# Patient Record
Sex: Male | Born: 1965 | Race: White | Hispanic: No | Marital: Married | Smoking: Former smoker
Health system: Southern US, Community
[De-identification: ages and names within clinical notes are randomized; demographics above are authoritative.]

## PROBLEM LIST (undated history)

## (undated) DIAGNOSIS — I10 Essential (primary) hypertension: Secondary | ICD-10-CM

## (undated) DIAGNOSIS — F988 Other specified behavioral and emotional disorders with onset usually occurring in childhood and adolescence: Secondary | ICD-10-CM

## (undated) DIAGNOSIS — M1990 Unspecified osteoarthritis, unspecified site: Secondary | ICD-10-CM

## (undated) DIAGNOSIS — F32A Depression, unspecified: Secondary | ICD-10-CM

## (undated) DIAGNOSIS — F329 Major depressive disorder, single episode, unspecified: Secondary | ICD-10-CM

---

## 1994-10-25 HISTORY — PX: OTHER SURGICAL HISTORY: SHX169

## 1998-10-25 HISTORY — PX: OTHER SURGICAL HISTORY: SHX169

## 2008-10-25 HISTORY — PX: OTHER SURGICAL HISTORY: SHX169

## 2009-01-06 ENCOUNTER — Emergency Department (HOSPITAL_COMMUNITY): Admission: EM | Admit: 2009-01-06 | Discharge: 2009-01-06 | Payer: Self-pay | Admitting: Emergency Medicine

## 2009-10-25 HISTORY — PX: OTHER SURGICAL HISTORY: SHX169

## 2010-07-24 ENCOUNTER — Emergency Department (HOSPITAL_COMMUNITY): Admission: EM | Admit: 2010-07-24 | Discharge: 2010-07-24 | Payer: Self-pay | Admitting: Emergency Medicine

## 2010-07-27 ENCOUNTER — Emergency Department (HOSPITAL_COMMUNITY): Admission: EM | Admit: 2010-07-27 | Discharge: 2010-07-27 | Payer: Self-pay | Admitting: Emergency Medicine

## 2010-07-28 ENCOUNTER — Emergency Department (HOSPITAL_COMMUNITY): Admission: EM | Admit: 2010-07-28 | Discharge: 2010-07-28 | Payer: Self-pay | Admitting: Emergency Medicine

## 2010-07-30 ENCOUNTER — Emergency Department (HOSPITAL_COMMUNITY): Admission: EM | Admit: 2010-07-30 | Discharge: 2010-07-30 | Payer: Self-pay | Admitting: Family Medicine

## 2010-09-17 ENCOUNTER — Emergency Department (HOSPITAL_COMMUNITY): Admission: EM | Admit: 2010-09-17 | Discharge: 2010-09-18 | Payer: Self-pay | Admitting: Emergency Medicine

## 2010-11-14 ENCOUNTER — Encounter: Payer: Self-pay | Admitting: Family Medicine

## 2010-11-15 ENCOUNTER — Encounter: Payer: Self-pay | Admitting: Orthopedic Surgery

## 2010-12-31 ENCOUNTER — Other Ambulatory Visit: Payer: Self-pay | Admitting: Family Medicine

## 2010-12-31 DIAGNOSIS — M545 Low back pain, unspecified: Secondary | ICD-10-CM

## 2010-12-31 DIAGNOSIS — W19XXXA Unspecified fall, initial encounter: Secondary | ICD-10-CM

## 2010-12-31 DIAGNOSIS — R202 Paresthesia of skin: Secondary | ICD-10-CM

## 2011-01-01 ENCOUNTER — Other Ambulatory Visit: Payer: Self-pay

## 2011-01-03 ENCOUNTER — Emergency Department (HOSPITAL_COMMUNITY)
Admission: EM | Admit: 2011-01-03 | Discharge: 2011-01-03 | Disposition: A | Discharge status: Home / Self Care | Payer: Medicaid Other | Attending: Emergency Medicine | Admitting: Emergency Medicine

## 2011-01-03 DIAGNOSIS — M549 Dorsalgia, unspecified: Secondary | ICD-10-CM | POA: Insufficient documentation

## 2011-01-03 DIAGNOSIS — M5430 Sciatica, unspecified side: Secondary | ICD-10-CM | POA: Insufficient documentation

## 2011-01-05 LAB — RAPID URINE DRUG SCREEN, HOSP PERFORMED
Benzodiazepines: POSITIVE — AB
Cocaine: NOT DETECTED
Tetrahydrocannabinol: NOT DETECTED

## 2011-01-05 LAB — CBC
Hemoglobin: 9.1 g/dL — ABNORMAL LOW (ref 13.0–17.0)
MCH: 31.2 pg (ref 26.0–34.0)
MCHC: 33.2 g/dL (ref 30.0–36.0)
Platelets: 385 10*3/uL (ref 150–400)
RDW: 17.7 % — ABNORMAL HIGH (ref 11.5–15.5)

## 2011-01-05 LAB — COMPREHENSIVE METABOLIC PANEL
ALT: 17 U/L (ref 0–53)
AST: 29 U/L (ref 0–37)
Albumin: 3.3 g/dL — ABNORMAL LOW (ref 3.5–5.2)
Alkaline Phosphatase: 63 U/L (ref 39–117)
BUN: 12 mg/dL (ref 6–23)
Chloride: 101 mEq/L (ref 96–112)
GFR calc Af Amer: >60 mL/min (ref >60)
Potassium: 3.2 mEq/L — ABNORMAL LOW (ref 3.5–5.1)
Sodium: 138 mEq/L (ref 135–145)
Total Bilirubin: 0.5 mg/dL (ref 0.3–1.2)

## 2011-01-05 LAB — DIFFERENTIAL
Basophils Absolute: 0 10*3/uL (ref 0.0–0.1)
Basophils Relative: 0 % (ref 0–1)
Eosinophils Absolute: 0.3 10*3/uL (ref 0.0–0.7)
Eosinophils Relative: 2 % (ref 0–5)
Monocytes Absolute: 1.1 10*3/uL — ABNORMAL HIGH (ref 0.1–1.0)

## 2011-01-05 LAB — ACETAMINOPHEN LEVEL: Acetaminophen (Tylenol), Serum: 13.2 ug/mL (ref 10–30)

## 2011-01-05 LAB — ETHANOL: Alcohol, Ethyl (B): <5 mg/dL (ref 0–10)

## 2011-02-01 ENCOUNTER — Other Ambulatory Visit: Payer: Self-pay | Admitting: Orthopedic Surgery

## 2011-02-01 ENCOUNTER — Encounter (HOSPITAL_COMMUNITY): Payer: Medicaid Other

## 2011-02-01 ENCOUNTER — Ambulatory Visit (HOSPITAL_COMMUNITY)
Admission: RE | Admit: 2011-02-01 | Discharge: 2011-02-01 | Disposition: A | Discharge status: Home / Self Care | Payer: Medicaid Other | Source: Ambulatory Visit | Attending: Orthopedic Surgery | Admitting: Orthopedic Surgery

## 2011-02-01 ENCOUNTER — Other Ambulatory Visit (HOSPITAL_COMMUNITY): Payer: Self-pay | Admitting: Orthopedic Surgery

## 2011-02-01 DIAGNOSIS — Z01818 Encounter for other preprocedural examination: Secondary | ICD-10-CM

## 2011-02-01 DIAGNOSIS — Z01812 Encounter for preprocedural laboratory examination: Secondary | ICD-10-CM | POA: Insufficient documentation

## 2011-02-01 LAB — DIFFERENTIAL
Basophils Relative: 0 % (ref 0–1)
Eosinophils Absolute: 0 10*3/uL (ref 0.0–0.7)
Lymphs Abs: 1.9 10*3/uL (ref 0.7–4.0)
Monocytes Absolute: 1 10*3/uL (ref 0.1–1.0)
Monocytes Relative: 8 % (ref 3–12)

## 2011-02-01 LAB — COMPREHENSIVE METABOLIC PANEL
ALT: 16 U/L (ref 0–53)
AST: 18 U/L (ref 0–37)
Alkaline Phosphatase: 65 U/L (ref 39–117)
CO2: 25 mEq/L (ref 19–32)
Calcium: 9.3 mg/dL (ref 8.4–10.5)
Chloride: 106 mEq/L (ref 96–112)
GFR calc non Af Amer: >60 mL/min (ref >60)
Glucose, Bld: 105 mg/dL — ABNORMAL HIGH (ref 70–99)
Sodium: 140 mEq/L (ref 135–145)
Total Bilirubin: 0.4 mg/dL (ref 0.3–1.2)

## 2011-02-01 LAB — CBC
Hemoglobin: 13 g/dL (ref 13.0–17.0)
MCH: 33.2 pg (ref 26.0–34.0)
MCHC: 33.6 g/dL (ref 30.0–36.0)
MCV: 98.7 fL (ref 78.0–100.0)

## 2011-02-01 LAB — URINALYSIS, ROUTINE W REFLEX MICROSCOPIC
Bilirubin Urine: NEGATIVE
Glucose, UA: NEGATIVE mg/dL
Hgb urine dipstick: NEGATIVE
Protein, ur: NEGATIVE mg/dL
Urobilinogen, UA: 0.2 mg/dL (ref 0.0–1.0)

## 2011-02-01 LAB — PROTIME-INR: Prothrombin Time: 13.7 seconds (ref 11.6–15.2)

## 2011-02-01 LAB — TYPE AND SCREEN: Antibody Screen: NEGATIVE

## 2011-02-01 LAB — SURGICAL PCR SCREEN: Staphylococcus aureus: NEGATIVE

## 2011-02-05 ENCOUNTER — Ambulatory Visit (HOSPITAL_COMMUNITY): Payer: Medicaid Other

## 2011-02-05 ENCOUNTER — Other Ambulatory Visit: Payer: Self-pay | Admitting: Orthopedic Surgery

## 2011-02-05 ENCOUNTER — Observation Stay (HOSPITAL_COMMUNITY)
Admission: RE | Admit: 2011-02-05 | Discharge: 2011-02-06 | Disposition: A | Discharge status: Home / Self Care | Payer: Medicaid Other | Source: Ambulatory Visit | Attending: Orthopedic Surgery | Admitting: Orthopedic Surgery

## 2011-02-05 DIAGNOSIS — M5126 Other intervertebral disc displacement, lumbar region: Principal | ICD-10-CM | POA: Insufficient documentation

## 2011-02-05 DIAGNOSIS — F411 Generalized anxiety disorder: Secondary | ICD-10-CM | POA: Insufficient documentation

## 2011-02-05 DIAGNOSIS — M549 Dorsalgia, unspecified: Secondary | ICD-10-CM

## 2011-02-05 DIAGNOSIS — M79609 Pain in unspecified limb: Secondary | ICD-10-CM | POA: Insufficient documentation

## 2011-02-11 NOTE — Op Note (Signed)
Christopher Underwood, Christopher Underwood                  ACCOUNT NO.:  [id number]  MEDICAL RECORD NO.:  [id number]           PATIENT TYPE:  O  LOCATION:  DAYL                         FACILITY:  [facility]  PHYSICIAN:  Georges Lynch. Jawon Dipiero, M.D.DATE OF BIRTH:  04-20-1966  DATE OF PROCEDURE:  02/05/2011 DATE OF DISCHARGE:                              OPERATIVE REPORT   SURGEON:  Georges Lynch. Darrelyn Hillock, MD and Marlowe Kays, MD  PREOPERATIVE DIAGNOSES: 1. Lateral recess stenosis, L5-S1, on the left with rather significant     foraminal stenosis, L5-S1, on the left. 2. Large herniated disk, L5-S1, on the left.  POSTOPERATIVE DIAGNOSES: 1. Lateral recess stenosis, L5-S1, on the left with rather significant     foraminal stenosis, L5-S1, on the left. 2. Large herniated disk, L5-S1, on the left.  OPERATION: 1. Decompression of lateral recess, L5-S1, on the left. 2. Foraminotomy, L5-S1 on the left. 3. Microdiskectomy for large herniated disk, L5-S1, on the left.  Note, preoperatively the only thing he have is left leg pain.  PROCEDURE:  Under general anesthesia, the patient on the spinal frame, routine orthopedic prep and drape of the back carried out and the appropriate time-out was carried out first.  I also marked the appropriate left side of his back in the holding area.  At this time, two needles were placed in the back for localization purposes.  X-ray was taken.  At this time, another incision was made over L5-S1. Bleeders identified and cauterized.  I stripped the muscle from the lamina and spinous processes bilaterally.  Another x-ray was taken to verify the position.  I then went down, stripped the muscle from the L5- S1 space, identified the space, brought the microscope in, and then did a hemilaminectomy in usual fashion, L5-S1 on the left.  We had marked overgrowth of the lateral recess and narrowing of the foramina. Following that, we gently stripped the ligamentum flavum.  Great care was taken not  to injure the underlying dura.  At this time, I then went down and gently identified the dura and we had to decompress the recess and opened up the foramen in order to expose the root which was extremely sensitive to touch.  We cauterized lateral recess veins, gently retracted the dura with D'Errico retractor, identified the disk. A needle was placed in the disk space and x-ray was taken.  Note, the appropriate L5-S1 disk space was just distal to the compression fracture of L5.  The cruciate incision then was made in the posterior longitudinal ligament of L5-S1 and multiple large pieces of disk material was removed.  I then utilized a nerve hook and the Epstein curettes and the curette out the subligamentous material as well.  Once this was completed, we then made sure that the nerve root was free.  We extended the foraminotomy.  We were able to easily pass a hockey-stick out proximally and distally without any problem.  We thoroughly irrigated out the area, loosely applied some thrombin-soaked Gelfoam, and the wound was closed in layers in usual fashion except I left a small deep distal part of the wound open  for drainage purposes.  Subcu was closed with 0 Vicryl, skin with nylon staples, and sterile Neosporin dressing was applied.  Preop, given 1 g of IV Ancef.  SURGEON:  Georges Lynch. Darrelyn Hillock, MD  ASSISTANT:  Marlowe Kays, MD          ______________________________ Georges Lynch Darrelyn Hillock, M.D.     RAG/MEDQ  D:  02/05/2011  T:  02/05/2011  Job:  [id number]  Electronically Signed by Ranee Gosselin M.D. on 02/11/2011 04:54:09 AM

## 2011-02-17 NOTE — Discharge Summary (Addendum)
  NAMEERWIN, NISHIYAMA                  ACCOUNT NO.:  [id number]  MEDICAL RECORD NO.:  [id number]           PATIENT TYPE:  O  LOCATION:  1611                         FACILITY:  [facility]  PHYSICIAN:  Georges Lynch. Haig Gerardo, M.D.DATE OF BIRTH:  [birth year]  DATE OF ADMISSION:  02/05/2011 DATE OF DISCHARGE:  02/06/2011                              DISCHARGE SUMMARY   ADDENDUM:  Please add this to just the rest of the labs.  Pathology repot is sample of the disc material was sent to the lab and was found to be consistent with fibrocartilage intravertebral disc material.  HOSPITAL COURSE:  The patient was admitted to [facility] on February 05, 2011.  He underwent the above-stated procedure without complication.  Following the adequate time to the recovery room, he was taken to the floor for further recovery.  He was on PCA with reduced dose of Plavix as well as muscle relaxant for pain control. Postoperative day #1, the patient seems to be doing well.  He was thorough from his surgery but otherwise pain is well controlled.  No complaints of nausea or vomiting.  Plan to discharge home on postoperative day #1, which is February 06, 2011.  He was also visited by physical therapy for an evaluation and was educated on back precaution.  DISPOSITION:  To home on February 06, 2011.  DISCHARGE MEDICATIONS: 1. Robaxin 500 mg 1 tab p.o. q.6h. p.r.n. muscle spasm. 2. Oxycodone/acetaminophen 10/650 mg 1 tab p.o. q.4h. p.r.n. pain. 3. Lexapro 20 mg 1 tab p.o. q.a.m. 4. Vitamin C over-the-counter 1 tab p.o. daily. 5. Vitamin E over-the-counter 1 tab p.o. daily. 6. Xanax 1 mg 1 tab p.o. b.i.d. p.r.n. anxiety.  DISCHARGE INSTRUCTIONS: 1. Activities:  The patient will increase his activities slowly.  He     should use a walker.  He is able to shower. 2. Diet:  No restrictions. 3. Wound Care:  Daily dressing changes.  To keep the dressing clean     and dry. 4. Followup:  He will follow up with Dr. Darrelyn Hillock  in 2 weeks from the     day of surgery.  He should contact the office at [phone] at     scheduled appointment.  CONDITION ON DISCHARGE:  Improved.     Rozell Searing, PAC   ______________________________ Georges Lynch Darrelyn Hillock, M.D.    LD/MEDQ  D:  02/15/2011  T:  02/15/2011  Job:  [id number]  Electronically Signed by Rozell Searing  on 02/17/2011 07:03:46 PM Electronically Signed by Ranee Gosselin M.D. on 03/05/2011 06:47:07 AM

## 2011-02-17 NOTE — Discharge Summary (Addendum)
  Underwood, Christopher                  ACCOUNT NO.:  [id number]  MEDICAL RECORD NO.:  [id number]           PATIENT TYPE:  O  LOCATION:  1611                         FACILITY:  [facility]  PHYSICIAN:  Georges Lynch. Christie Copley, M.D.DATE OF BIRTH:  1966-09-23  DATE OF ADMISSION:  02/05/2011 DATE OF DISCHARGE:  02/06/2011                              DISCHARGE SUMMARY   ADMITTING DIAGNOSES: 1. Large disc herniation. 2. Anxiety.  DISCHARGE DIAGNOSES: 1. Large disc herniation, status post microdiscectomy. 2. Anxiety.  PROCEDURE:  On 02/05/2011, Christopher Underwood was taken to the operating room by surgeon, Dr. Ranee Gosselin, assistant Dr. Marlowe Kays.  He underwent decompression of lateral recess at L5-S1 on the left, foraminotomy at L5-S1 on the left and microdiscectomy for large herniated disc at L5-S1 on the left.  The procedure was performed under general anesthesia.  Routine orthopedic prep and drape were carried out. There were no complications with the procedure.  The patient was returned to the recovery room in satisfactory condition.  Also, he was given 1 g IV Ancef preoperatively.  LABORATORY DATA:  Fragments of the soft tissue from the discectomy were sent to the lab for pathology and it was found to be  DICTATION ENDED AT THIS POINT.     Rozell Searing, PAC   ______________________________ Georges Lynch Darrelyn Hillock, M.D.    LD/MEDQ  D:  02/15/2011  T:  02/15/2011  Job:  [id number]  Electronically Signed by Rozell Searing  on 02/17/2011 07:03:43 PM Electronically Signed by Ranee Gosselin M.D. on 03/05/2011 06:47:05 AM

## 2011-07-25 ENCOUNTER — Emergency Department (HOSPITAL_COMMUNITY)
Admission: EM | Admit: 2011-07-25 | Discharge: 2011-07-25 | Disposition: A | Discharge status: Home / Self Care | Payer: Medicaid Other | Attending: Emergency Medicine | Admitting: Emergency Medicine

## 2011-07-25 DIAGNOSIS — M948X9 Other specified disorders of cartilage, unspecified sites: Secondary | ICD-10-CM | POA: Insufficient documentation

## 2011-07-25 DIAGNOSIS — M25579 Pain in unspecified ankle and joints of unspecified foot: Secondary | ICD-10-CM | POA: Insufficient documentation

## 2011-07-25 DIAGNOSIS — M79609 Pain in unspecified limb: Secondary | ICD-10-CM | POA: Insufficient documentation

## 2011-07-25 DIAGNOSIS — G8929 Other chronic pain: Secondary | ICD-10-CM | POA: Insufficient documentation

## 2011-07-25 DIAGNOSIS — Z79899 Other long term (current) drug therapy: Secondary | ICD-10-CM | POA: Insufficient documentation

## 2012-05-30 ENCOUNTER — Encounter (HOSPITAL_COMMUNITY): Payer: Self-pay

## 2012-05-30 ENCOUNTER — Encounter (HOSPITAL_COMMUNITY): Payer: Self-pay | Admitting: Pharmacy Technician

## 2012-05-30 ENCOUNTER — Other Ambulatory Visit: Payer: Self-pay

## 2012-05-30 ENCOUNTER — Encounter (HOSPITAL_COMMUNITY)
Admission: RE | Admit: 2012-05-30 | Discharge: 2012-05-30 | Disposition: A | Discharge status: Home / Self Care | Payer: Medicaid Other | Source: Ambulatory Visit | Attending: Orthopedic Surgery | Admitting: Orthopedic Surgery

## 2012-05-30 DIAGNOSIS — Z01810 Encounter for preprocedural cardiovascular examination: Secondary | ICD-10-CM | POA: Insufficient documentation

## 2012-05-30 DIAGNOSIS — Z01812 Encounter for preprocedural laboratory examination: Secondary | ICD-10-CM | POA: Insufficient documentation

## 2012-05-30 DIAGNOSIS — R000 Tachycardia, unspecified: Secondary | ICD-10-CM | POA: Insufficient documentation

## 2012-05-30 HISTORY — DX: Other specified behavioral and emotional disorders with onset usually occurring in childhood and adolescence: F98.8

## 2012-05-30 HISTORY — DX: Major depressive disorder, single episode, unspecified: F32.9

## 2012-05-30 HISTORY — DX: Depression, unspecified: F32.A

## 2012-05-30 HISTORY — DX: Unspecified osteoarthritis, unspecified site: M19.90

## 2012-05-30 LAB — COMPREHENSIVE METABOLIC PANEL
ALT: 91 U/L — ABNORMAL HIGH (ref 0–53)
AST: 72 U/L — ABNORMAL HIGH (ref 0–37)
Albumin: 4.7 g/dL (ref 3.5–5.2)
Alkaline Phosphatase: 91 U/L (ref 39–117)
BUN: 9 mg/dL (ref 6–23)
CO2: 24 mEq/L (ref 19–32)
Calcium: 9.7 mg/dL (ref 8.4–10.5)
Chloride: 97 mEq/L (ref 96–112)
Creatinine, Ser: 0.99 mg/dL (ref 0.50–1.35)
GFR calc Af Amer: >90 mL/min (ref >90)
GFR calc non Af Amer: >90 mL/min (ref >90)
Glucose, Bld: 125 mg/dL — ABNORMAL HIGH (ref 70–99)
Potassium: 3.7 mEq/L (ref 3.5–5.1)
Sodium: 138 mEq/L (ref 135–145)
Total Bilirubin: 0.2 mg/dL — ABNORMAL LOW (ref 0.3–1.2)
Total Protein: 8.4 g/dL — ABNORMAL HIGH (ref 6.0–8.3)

## 2012-05-30 LAB — CBC
HCT: 41 % (ref 39.0–52.0)
MCV: 98.6 fL (ref 78.0–100.0)
Platelets: 304 10*3/uL (ref 150–400)
RBC: 4.16 MIL/uL — ABNORMAL LOW (ref 4.22–5.81)
WBC: 9.6 10*3/uL (ref 4.0–10.5)

## 2012-05-30 LAB — URINALYSIS, ROUTINE W REFLEX MICROSCOPIC
Bilirubin Urine: NEGATIVE
Glucose, UA: NEGATIVE mg/dL
Hgb urine dipstick: NEGATIVE
Leukocytes, UA: NEGATIVE
Nitrite: NEGATIVE
Protein, ur: NEGATIVE mg/dL
Specific Gravity, Urine: 1.037 — ABNORMAL HIGH (ref 1.005–1.030)
Urobilinogen, UA: 0.2 mg/dL (ref 0.0–1.0)
pH: 5.5 (ref 5.0–8.0)

## 2012-05-30 LAB — PROTIME-INR
INR: 0.95 (ref 0.00–1.49)
Prothrombin Time: 12.9 seconds (ref 11.6–15.2)

## 2012-05-30 LAB — SURGICAL PCR SCREEN: Staphylococcus aureus: NEGATIVE

## 2012-05-30 LAB — APTT: aPTT: 31 seconds (ref 24–37)

## 2012-05-30 NOTE — Pre-Procedure Instructions (Signed)
obtained ekg due to elevated blood pressure at pst visit. Pt heart rate elevated to 120.

## 2012-05-30 NOTE — Patient Instructions (Addendum)
20 Christopher Underwood  05/30/2012   Your procedure is scheduled on:    Report to [facility] at 1000  AM.  Call this number if you have problems the morning of surgery: [phone]   Remember:   Do not eat food or drink liquids:After Midnight.  .  Take these medicines the morning of surgery with A SIP OF WATER:pt wishes to take no medicines morning of surgery.   Do not wear jewelry or make up.  Do not wear lotions, powders, or perfumes.Do not wear deodorant.    Do not bring valuables to the hospital.  Contacts, dentures or bridgework may not be worn into surgery.  Leave suitcase in the car. After surgery it may be brought to your room.  For patients admitted to the hospital, checkout time is 11:00 AM the day   discharge                             Special Instructions: CHG Shower Use Special Wash: 1/2 bottle night before surgery and 1/2 bottle morning of surgery, use regular soap on face and front and back private area. You may shave your face morning of surgery.   Please read over the following fact sheets that you were given: MRSA Information  [facility] pre op nurse phone number [phone], call if needed

## 2012-06-01 NOTE — Progress Notes (Signed)
Faxed dr Darrelyn Hillock and made aware bp elevated at pst visit, ekg sinus tach.fax confirmnation received and placed on chart

## 2012-06-06 ENCOUNTER — Ambulatory Visit (HOSPITAL_COMMUNITY): Admission: RE | Admit: 2012-06-06 | Payer: Medicaid Other | Source: Ambulatory Visit | Admitting: Orthopedic Surgery

## 2012-06-06 ENCOUNTER — Encounter (HOSPITAL_COMMUNITY): Admission: RE | Payer: Self-pay | Source: Ambulatory Visit

## 2012-06-06 SURGERY — REPAIR, ROTATOR CUFF, OPEN
Anesthesia: General | Site: Shoulder | Laterality: Right

## 2015-07-23 ENCOUNTER — Emergency Department (HOSPITAL_COMMUNITY): Payer: Medicaid Other

## 2015-07-23 ENCOUNTER — Encounter (HOSPITAL_COMMUNITY): Payer: Self-pay | Admitting: Emergency Medicine

## 2015-07-23 ENCOUNTER — Emergency Department (HOSPITAL_COMMUNITY)
Admission: EM | Admit: 2015-07-23 | Discharge: 2015-07-23 | Disposition: A | Discharge status: Home / Self Care | Payer: Medicaid Other | Attending: Emergency Medicine | Admitting: Emergency Medicine

## 2015-07-23 DIAGNOSIS — R0602 Shortness of breath: Secondary | ICD-10-CM | POA: Insufficient documentation

## 2015-07-23 DIAGNOSIS — R55 Syncope and collapse: Secondary | ICD-10-CM | POA: Insufficient documentation

## 2015-07-23 DIAGNOSIS — Y9289 Other specified places as the place of occurrence of the external cause: Secondary | ICD-10-CM | POA: Insufficient documentation

## 2015-07-23 DIAGNOSIS — Y998 Other external cause status: Secondary | ICD-10-CM | POA: Diagnosis not present

## 2015-07-23 DIAGNOSIS — G8929 Other chronic pain: Secondary | ICD-10-CM

## 2015-07-23 DIAGNOSIS — R000 Tachycardia, unspecified: Secondary | ICD-10-CM | POA: Insufficient documentation

## 2015-07-23 DIAGNOSIS — R61 Generalized hyperhidrosis: Secondary | ICD-10-CM | POA: Insufficient documentation

## 2015-07-23 DIAGNOSIS — E860 Dehydration: Secondary | ICD-10-CM | POA: Diagnosis not present

## 2015-07-23 DIAGNOSIS — F909 Attention-deficit hyperactivity disorder, unspecified type: Secondary | ICD-10-CM | POA: Diagnosis not present

## 2015-07-23 DIAGNOSIS — S0101XA Laceration without foreign body of scalp, initial encounter: Secondary | ICD-10-CM | POA: Insufficient documentation

## 2015-07-23 DIAGNOSIS — Z79899 Other long term (current) drug therapy: Secondary | ICD-10-CM | POA: Insufficient documentation

## 2015-07-23 DIAGNOSIS — Y9389 Activity, other specified: Secondary | ICD-10-CM | POA: Insufficient documentation

## 2015-07-23 DIAGNOSIS — W01198A Fall on same level from slipping, tripping and stumbling with subsequent striking against other object, initial encounter: Secondary | ICD-10-CM | POA: Insufficient documentation

## 2015-07-23 DIAGNOSIS — S0191XA Laceration without foreign body of unspecified part of head, initial encounter: Secondary | ICD-10-CM

## 2015-07-23 DIAGNOSIS — M1990 Unspecified osteoarthritis, unspecified site: Secondary | ICD-10-CM | POA: Insufficient documentation

## 2015-07-23 DIAGNOSIS — S299XXA Unspecified injury of thorax, initial encounter: Secondary | ICD-10-CM | POA: Diagnosis not present

## 2015-07-23 DIAGNOSIS — S0990XA Unspecified injury of head, initial encounter: Secondary | ICD-10-CM | POA: Diagnosis present

## 2015-07-23 DIAGNOSIS — Z87891 Personal history of nicotine dependence: Secondary | ICD-10-CM | POA: Diagnosis not present

## 2015-07-23 LAB — CBC
HCT: 42.4 % (ref 39.0–52.0)
HEMOGLOBIN: 15.1 g/dL (ref 13.0–17.0)
MCH: 35 pg — AB (ref 26.0–34.0)
MCHC: 35.6 g/dL (ref 30.0–36.0)
MCV: 98.1 fL (ref 78.0–100.0)
PLATELETS: 133 10*3/uL — AB (ref 150–400)
RBC: 4.32 MIL/uL (ref 4.22–5.81)
RDW: 12 % (ref 11.5–15.5)
WBC: 11.4 10*3/uL — ABNORMAL HIGH (ref 4.0–10.5)

## 2015-07-23 LAB — I-STAT TROPONIN, ED
TROPONIN I, POC: 0 ng/mL (ref 0.00–0.08)
Troponin i, poc: 0 ng/mL (ref 0.00–0.08)

## 2015-07-23 LAB — COMPREHENSIVE METABOLIC PANEL
ALT: 53 U/L (ref 17–63)
ANION GAP: 18 — AB (ref 5–15)
AST: 55 U/L — AB (ref 15–41)
Albumin: 4.5 g/dL (ref 3.5–5.0)
Alkaline Phosphatase: 74 U/L (ref 38–126)
BILIRUBIN TOTAL: 0.7 mg/dL (ref 0.3–1.2)
BUN: 15 mg/dL (ref 6–20)
CHLORIDE: 93 mmol/L — AB (ref 101–111)
CO2: 23 mmol/L (ref 22–32)
Calcium: 9.8 mg/dL (ref 8.9–10.3)
Creatinine, Ser: 1.63 mg/dL — ABNORMAL HIGH (ref 0.61–1.24)
GFR calc Af Amer: 56 mL/min — ABNORMAL LOW (ref >60)
GFR, EST NON AFRICAN AMERICAN: 48 mL/min — AB (ref >60)
Glucose, Bld: 125 mg/dL — ABNORMAL HIGH (ref 65–99)
POTASSIUM: 2.8 mmol/L — AB (ref 3.5–5.1)
Sodium: 134 mmol/L — ABNORMAL LOW (ref 135–145)
TOTAL PROTEIN: 8.6 g/dL — AB (ref 6.5–8.1)

## 2015-07-23 LAB — LIPASE, BLOOD: LIPASE: 43 U/L (ref 22–51)

## 2015-07-23 MED ORDER — SODIUM CHLORIDE 0.9 % IV BOLUS (SEPSIS)
1000.0000 mL | Freq: Once | INTRAVENOUS | Status: AC
  Administered 2015-07-23: 1000 mL via INTRAVENOUS

## 2015-07-23 MED ORDER — ASPIRIN 81 MG PO CHEW
324.0000 mg | CHEWABLE_TABLET | Freq: Once | ORAL | Status: AC
  Administered 2015-07-23: 324 mg via ORAL
  Filled 2015-07-23: qty 4

## 2015-07-23 MED ORDER — OXYCODONE HCL 5 MG PO TABS
5.0000 mg | ORAL_TABLET | Freq: Once | ORAL | Status: AC
  Administered 2015-07-23: 5 mg via ORAL
  Filled 2015-07-23: qty 1

## 2015-07-23 MED ORDER — POTASSIUM CHLORIDE CRYS ER 20 MEQ PO TBCR
80.0000 meq | EXTENDED_RELEASE_TABLET | ORAL | Status: AC
  Administered 2015-07-23 (×2): 80 meq via ORAL
  Filled 2015-07-23 (×2): qty 4

## 2015-07-23 MED ORDER — NITROGLYCERIN 0.4 MG SL SUBL
0.4000 mg | SUBLINGUAL_TABLET | SUBLINGUAL | Status: DC | PRN
  Filled 2015-07-23: qty 1

## 2015-07-23 MED ORDER — LIDOCAINE-EPINEPHRINE 1 %-1:100000 IJ SOLN
20.0000 mL | Freq: Once | INTRAMUSCULAR | Status: AC
  Administered 2015-07-23: 20 mL via INTRADERMAL
  Filled 2015-07-23: qty 1

## 2015-07-23 MED ORDER — ACETAMINOPHEN 500 MG PO TABS
1000.0000 mg | ORAL_TABLET | Freq: Once | ORAL | Status: AC
  Administered 2015-07-23: 1000 mg via ORAL
  Filled 2015-07-23: qty 2

## 2015-07-23 NOTE — Discharge Instructions (Signed)

## 2015-07-23 NOTE — ED Notes (Signed)
Per pt, states he slipped and fell and has a laceration to the back of head-states he did loose conscriousness

## 2015-07-23 NOTE — ED Provider Notes (Signed)
CSN: [id number]     Arrival date & time 07/23/15  1611 History   First MD Initiated Contact with Patient 07/23/15 1645     Chief Complaint  Patient presents with  . Head Laceration     (Consider location/radiation/quality/duration/timing/severity/associated sxs/prior Treatment) Patient is a 49 y.o. male presenting with scalp laceration and syncope. The history is provided by the patient.  Head Laceration This is a new problem. Associated symptoms include chest pain and shortness of breath. Pertinent negatives include no abdominal pain and no headaches.  Loss of Consciousness Episode history:  Single Most recent episode:  Today Duration:  2 seconds Timing:  Constant Progression:  Unchanged Chronicity:  New Witnessed: no   Relieved by:  Nothing Worsened by:  Nothing tried Ineffective treatments:  None tried Associated symptoms: chest pain, diaphoresis, nausea and shortness of breath   Associated symptoms: no confusion, no fever, no headaches, no palpitations and no vomiting    49 yo M with a chief complaint of near syncope and a fall. Patient states that he was walking started feeling really sweaty and nauseated. This associated with some chest pain shortness of breath. Felt like he is to pass out and then he slipped on a rug and fell onto the back of his head. Had positive loss of consciousness at that time. States that this happened to him now 3 times over the past couple months. Has not sought medical care previously. Denies fevers or chills.  Past Medical History  Diagnosis Date  . Depression   . Attention deficit disorder   . Arthritis    Past Surgical History  Procedure Laterality Date  . Left knee arthroscopy  1996    x 2  . Cervical fusion with plating x 2  2000  . Right heel reconstruction  2000  . Right heel surgery  to remove screw  2000  . Right heel surgery for fx  2010  . Right heel wound i and d for infection   2011  . Right arm surgery for donor skin graft   2000  . Right foot surgery to clean out arthritis  2011  . Lower back surgery  2011   No family history on file. Social History  Substance Use Topics  . Smoking status: Former Smoker -- 0.25 packs/day for 1 years    Types: Cigarettes    Quit date: 10/26/1983  . Smokeless tobacco: Never Used  . Alcohol Use: Yes     Comment: every other day-glass of wine    Review of Systems  Constitutional: Positive for diaphoresis. Negative for fever and chills.  HENT: Negative for congestion and facial swelling.   Eyes: Negative for discharge and visual disturbance.  Respiratory: Positive for shortness of breath.   Cardiovascular: Positive for chest pain and syncope. Negative for palpitations.  Gastrointestinal: Positive for nausea. Negative for vomiting, abdominal pain and diarrhea.  Musculoskeletal: Negative for myalgias and arthralgias.  Skin: Negative for color change and rash.  Neurological: Negative for tremors, syncope and headaches.  Psychiatric/Behavioral: Negative for confusion and dysphoric mood.      Allergies  Review of patient's allergies indicates no known allergies.  Home Medications   Prior to Admission medications   Medication Sig Start Date End Date Taking? Authorizing Provider  amphetamine-dextroamphetamine (ADDERALL) 20 MG tablet Take 20 mg by mouth every morning.   Yes Historical Provider, MD  Ascorbic Acid (VITAMIN C PO) Take 1 tablet by mouth daily.   Yes Historical Provider, MD  Cholecalciferol (VITAMIN  D PO) Take 1 tablet by mouth daily.   Yes Historical Provider, MD  ibuprofen (ADVIL,MOTRIN) 200 MG tablet Take 800 mg by mouth 3 (three) times daily. For pain   Yes Historical Provider, MD  lisinopril-hydrochlorothiazide (PRINZIDE,ZESTORETIC) 20-12.5 MG tablet Take 1 tablet by mouth daily.   Yes Historical Provider, MD  Multiple Vitamin (MULTIVITAMIN WITH MINERALS) TABS Take 1 tablet by mouth daily.   Yes Historical Provider, MD   BP 125/85 mmHg  Pulse 99   Temp(Src) 98.1 F (36.7 C) (Oral)  Resp 10  SpO2 100% Physical Exam  Constitutional: He is oriented to person, place, and time. He appears well-developed and well-nourished.  HENT:  Head: Normocephalic and atraumatic.  Eyes: EOM are normal. Pupils are equal, round, and reactive to light.  Neck: Normal range of motion. Neck supple. No JVD present.  Cardiovascular: Regular rhythm.  Tachycardia present.  Exam reveals no gallop and no friction rub.   No murmur heard. Pulmonary/Chest: No respiratory distress. He has no wheezes. He has no rales.  Abdominal: He exhibits no distension. There is no tenderness. There is no rebound and no guarding.  Musculoskeletal: Normal range of motion.  Neurological: He is alert and oriented to person, place, and time.  Skin: No rash noted. No pallor.  Psychiatric: He has a normal mood and affect. His behavior is normal.    ED Course  LACERATION REPAIR Date/Time: 07/23/2015 11:02 PM Performed by: Adela Lank DAN Authorized by: Melene Plan Consent: Verbal consent obtained. Risks and benefits: risks, benefits and alternatives were discussed Consent given by: patient and parent Required items: required blood products, implants, devices, and special equipment available Patient identity confirmed: verbally with patient Time out: Immediately prior to procedure a "time out" was called to verify the correct patient, procedure, equipment, support staff and site/side marked as required. Body area: head/neck Location details: scalp Laceration length: 4 cm Anesthesia: local infiltration Local anesthetic: lidocaine 1% with epinephrine Anesthetic total: 10 ml Preparation: Patient was prepped and draped in the usual sterile fashion. Irrigation solution: saline Irrigation method: jet lavage Amount of cleaning: standard Number of sutures: 9 Technique: simple Approximation: close Approximation difficulty: simple Patient tolerance: Patient tolerated the procedure well  with no immediate complications   (including critical care time) Labs Review Labs Reviewed  CBC - Abnormal; Notable for the following:    WBC 11.4 (*)    MCH 35.0 (*)    Platelets 133 (*)    All other components within normal limits  COMPREHENSIVE METABOLIC PANEL - Abnormal; Notable for the following:    Sodium 134 (*)    Potassium 2.8 (*)    Chloride 93 (*)    Glucose, Bld 125 (*)    Creatinine, Ser 1.63 (*)    Total Protein 8.6 (*)    AST 55 (*)    GFR calc non Af Amer 48 (*)    GFR calc Af Amer 56 (*)    Anion gap 18 (*)    All other components within normal limits  LIPASE, BLOOD  I-STAT TROPOININ, ED  I-STAT TROPOININ, ED    Imaging Review Dg Chest 2 View  07/23/2015   CLINICAL DATA:  Productive cough for 3 days.  EXAM: CHEST  2 VIEW  COMPARISON:  None.  FINDINGS: The heart size and mediastinal contours are within normal limits. Both lungs are clear. The visualized skeletal structures are unremarkable.  IMPRESSION: Normal chest x-ray.   Electronically Signed   By: Rudie Meyer M.D.   On: 07/23/2015  18:20   Ct Head Wo Contrast  07/23/2015   CLINICAL DATA:  Fall, laceration back of head.  EXAM: CT HEAD WITHOUT CONTRAST  TECHNIQUE: Contiguous axial images were obtained from the base of the skull through the vertex without intravenous contrast.  COMPARISON:  None.  FINDINGS: No intracranial hemorrhage. No parenchymal contusion. No midline shift or mass effect. Basilar cisterns are patent. No skull base fracture. No fluid in the paranasal sinuses or mastoid air cells. Orbits are normal.  IMPRESSION: No intracranial trauma.   Electronically Signed   By: Genevive Bi M.D.   On: 07/23/2015 18:26   Dg Foot Complete Right  07/23/2015   CLINICAL DATA:  Chronic right foot pain.  Previous fusions.  EXAM: RIGHT FOOT COMPLETE - 3+ VIEW  COMPARISON:  None.  FINDINGS: Diffuse osteopenia noted. Chronic deformity of calcaneus seen with surgical screws. Previous fusions are seen involving the  subtalar and calcaneocuboid joints. Pes planus noted.  No evidence of acute fracture or osteolysis. Multiple surgical clips seen along the posterior aspect of the ankle joint.  IMPRESSION: No acute findings.   Electronically Signed   By: Myles Rosenthal M.D.   On: 07/23/2015 18:24   I have personally reviewed and evaluated these images and lab results as part of my medical decision-making.   EKG Interpretation None      MDM   Final diagnoses:  Dehydration  Near syncope  Laceration of head, initial encounter    49 yo M with a chief complaint of near syncope. Patient has had multiple of these events usually on exertion. Concerning for possible cardiac etiology. We'll obtain a delta troponin. EKG chest x-ray CBC CMP lipase. Give the patient a liter bolus of fluids. Patient has a four cm laceration to the back of his head which we'll repair with staples. CT head. No neck pain cleared by [place] C-spine rules.  Patient feeling much better after 2L of fluid and potassium.  Marked hypokalemia, patient with hx of same in the past.  AKI.  Discussed possibility of admission, patient declining at this time.  Delta trop negative.  Elevated anion gap.  CXR without pna, no urinary or abdominal symptoms.  Benign abdominal exam. Discussed with patient, will d/c home.  PCP follow up.  11:05 PM:  I have discussed the diagnosis/risks/treatment options with the patient and family and believe the pt to be eligible for discharge home to follow-up with PCP. We also discussed returning to the ED immediately if new or worsening sx occur. We discussed the sx which are most concerning (e.g., sudden worsening symptoms) that necessitate immediate return. Medications administered to the patient during their visit and any new prescriptions provided to the patient are listed below.  Medications given during this visit Medications  nitroGLYCERIN (NITROSTAT) SL tablet 0.4 mg (0.4 mg Sublingual Not Given 07/23/15 1850)  aspirin  chewable tablet 324 mg (324 mg Oral Given 07/23/15 1745)  sodium chloride 0.9 % bolus 1,000 mL (0 mLs Intravenous Stopped 07/23/15 1849)  lidocaine-EPINEPHrine (XYLOCAINE W/EPI) 1 %-1:100000 (with pres) injection 20 mL (20 mLs Intradermal Given 07/23/15 1850)  sodium chloride 0.9 % bolus 1,000 mL (0 mLs Intravenous Stopped 07/23/15 1953)  potassium chloride SA (K-DUR,KLOR-CON) CR tablet 80 mEq (80 mEq Oral Given 07/23/15 1928)  sodium chloride 0.9 % bolus 1,000 mL (0 mLs Intravenous Stopped 07/23/15 2059)  oxyCODONE (Oxy IR/ROXICODONE) immediate release tablet 5 mg (5 mg Oral Given 07/23/15 1952)  acetaminophen (TYLENOL) tablet 1,000 mg (1,000 mg Oral Given 07/23/15  1952)    Discharge Medication List as of 07/23/2015  9:37 PM       The patient appears reasonably screen and/or stabilized for discharge and I doubt any other medical condition or other [facility] requiring further screening, evaluation, or treatment in the ED at this time prior to discharge.    Melene Plan, DO 07/23/15 2305

## 2016-06-15 ENCOUNTER — Inpatient Hospital Stay (HOSPITAL_COMMUNITY)
Admission: EM | Admit: 2016-06-15 | Discharge: 2016-06-17 | DRG: 641 | Discharge status: Against Medical Advice | Payer: Medicaid Other | Attending: Internal Medicine | Admitting: Internal Medicine

## 2016-06-15 ENCOUNTER — Encounter (HOSPITAL_COMMUNITY): Payer: Self-pay

## 2016-06-15 DIAGNOSIS — F10239 Alcohol dependence with withdrawal, unspecified: Secondary | ICD-10-CM | POA: Diagnosis present

## 2016-06-15 DIAGNOSIS — F10930 Alcohol use, unspecified with withdrawal, uncomplicated: Secondary | ICD-10-CM

## 2016-06-15 DIAGNOSIS — D6959 Other secondary thrombocytopenia: Secondary | ICD-10-CM | POA: Diagnosis present

## 2016-06-15 DIAGNOSIS — E876 Hypokalemia: Secondary | ICD-10-CM

## 2016-06-15 DIAGNOSIS — Z7982 Long term (current) use of aspirin: Secondary | ICD-10-CM | POA: Diagnosis not present

## 2016-06-15 DIAGNOSIS — E872 Acidosis: Principal | ICD-10-CM | POA: Diagnosis present

## 2016-06-15 DIAGNOSIS — Z79899 Other long term (current) drug therapy: Secondary | ICD-10-CM

## 2016-06-15 DIAGNOSIS — Z87891 Personal history of nicotine dependence: Secondary | ICD-10-CM

## 2016-06-15 DIAGNOSIS — E8729 Other acidosis: Secondary | ICD-10-CM | POA: Diagnosis present

## 2016-06-15 DIAGNOSIS — R1084 Generalized abdominal pain: Secondary | ICD-10-CM | POA: Diagnosis not present

## 2016-06-15 DIAGNOSIS — Y900 Blood alcohol level of less than 20 mg/100 ml: Secondary | ICD-10-CM

## 2016-06-15 DIAGNOSIS — F10939 Alcohol use, unspecified with withdrawal, unspecified: Secondary | ICD-10-CM | POA: Diagnosis present

## 2016-06-15 DIAGNOSIS — R1013 Epigastric pain: Secondary | ICD-10-CM | POA: Diagnosis present

## 2016-06-15 DIAGNOSIS — F10230 Alcohol dependence with withdrawal, uncomplicated: Secondary | ICD-10-CM | POA: Diagnosis not present

## 2016-06-15 DIAGNOSIS — E860 Dehydration: Secondary | ICD-10-CM | POA: Diagnosis present

## 2016-06-15 DIAGNOSIS — R109 Unspecified abdominal pain: Secondary | ICD-10-CM

## 2016-06-15 LAB — COMPREHENSIVE METABOLIC PANEL
ALBUMIN: 4.7 g/dL (ref 3.5–5.0)
ALK PHOS: 76 U/L (ref 38–126)
ALT: 68 U/L — ABNORMAL HIGH (ref 17–63)
ANION GAP: 18 — AB (ref 5–15)
AST: 95 U/L — ABNORMAL HIGH (ref 15–41)
BILIRUBIN TOTAL: 1.7 mg/dL — AB (ref 0.3–1.2)
BUN: 18 mg/dL (ref 6–20)
CALCIUM: 10.8 mg/dL — AB (ref 8.9–10.3)
CO2: 23 mmol/L (ref 22–32)
Chloride: 94 mmol/L — ABNORMAL LOW (ref 101–111)
Creatinine, Ser: 0.93 mg/dL (ref 0.61–1.24)
GFR calc non Af Amer: >60 mL/min (ref >60)
GLUCOSE: 201 mg/dL — AB (ref 65–99)
POTASSIUM: 4.5 mmol/L (ref 3.5–5.1)
Sodium: 135 mmol/L (ref 135–145)
TOTAL PROTEIN: 8.6 g/dL — AB (ref 6.5–8.1)

## 2016-06-15 LAB — CBC
HEMATOCRIT: 42.2 % (ref 39.0–52.0)
HEMOGLOBIN: 15 g/dL (ref 13.0–17.0)
MCH: 34 pg (ref 26.0–34.0)
MCHC: 35.5 g/dL (ref 30.0–36.0)
MCV: 95.7 fL (ref 78.0–100.0)
Platelets: 121 10*3/uL — ABNORMAL LOW (ref 150–400)
RBC: 4.41 MIL/uL (ref 4.22–5.81)
RDW: 12.4 % (ref 11.5–15.5)
WBC: 12.7 10*3/uL — AB (ref 4.0–10.5)

## 2016-06-15 LAB — URINALYSIS, ROUTINE W REFLEX MICROSCOPIC
Glucose, UA: 100 mg/dL — AB
Hgb urine dipstick: NEGATIVE
Ketones, ur: >80 mg/dL — AB
LEUKOCYTES UA: NEGATIVE
NITRITE: NEGATIVE
PH: 6 (ref 5.0–8.0)
Protein, ur: 30 mg/dL — AB
SPECIFIC GRAVITY, URINE: 1.026 (ref 1.005–1.030)

## 2016-06-15 LAB — URINE MICROSCOPIC-ADD ON

## 2016-06-15 LAB — LIPASE, BLOOD: Lipase: 29 U/L (ref 11–51)

## 2016-06-15 LAB — ETHANOL

## 2016-06-15 LAB — AMYLASE: AMYLASE: 107 U/L — AB (ref 28–100)

## 2016-06-15 MED ORDER — LORAZEPAM 2 MG/ML IJ SOLN
0.0000 mg | Freq: Four times a day (QID) | INTRAMUSCULAR | Status: DC
  Administered 2016-06-16 – 2016-06-17 (×5): 2 mg via INTRAVENOUS
  Administered 2016-06-17: 4 mg via INTRAVENOUS
  Filled 2016-06-15 (×2): qty 1
  Filled 2016-06-15: qty 2
  Filled 2016-06-15 (×2): qty 1

## 2016-06-15 MED ORDER — LORAZEPAM 2 MG/ML IJ SOLN: 1.0000 mg | Freq: Four times a day (QID) | INTRAMUSCULAR | Status: DC | PRN

## 2016-06-15 MED ORDER — ADULT MULTIVITAMIN W/MINERALS CH
1.0000 | ORAL_TABLET | Freq: Every day | ORAL | Status: DC
  Administered 2016-06-16: 1 via ORAL
  Filled 2016-06-15: qty 1

## 2016-06-15 MED ORDER — SODIUM CHLORIDE 0.9 % IV BOLUS (SEPSIS)
2000.0000 mL | Freq: Once | INTRAVENOUS | Status: AC
  Administered 2016-06-15: 2000 mL via INTRAVENOUS

## 2016-06-15 MED ORDER — DIPHENHYDRAMINE HCL 50 MG/ML IJ SOLN
12.5000 mg | Freq: Once | INTRAMUSCULAR | Status: AC
  Administered 2016-06-15: 12.5 mg via INTRAVENOUS
  Filled 2016-06-15: qty 1

## 2016-06-15 MED ORDER — VITAMIN B-1 100 MG PO TABS
100.0000 mg | ORAL_TABLET | Freq: Every day | ORAL | Status: DC
Start: 2016-06-16 — End: 2016-06-17
  Administered 2016-06-16: 100 mg via ORAL
  Filled 2016-06-15: qty 1

## 2016-06-15 MED ORDER — LORAZEPAM 2 MG/ML IJ SOLN: 0.0000 mg | Freq: Two times a day (BID) | INTRAMUSCULAR | Status: DC

## 2016-06-15 MED ORDER — LORAZEPAM 1 MG PO TABS: 1.0000 mg | ORAL_TABLET | Freq: Four times a day (QID) | ORAL | Status: DC | PRN

## 2016-06-15 MED ORDER — FOLIC ACID 1 MG PO TABS
1.0000 mg | ORAL_TABLET | Freq: Every day | ORAL | Status: DC
  Administered 2016-06-16: 1 mg via ORAL
  Filled 2016-06-15: qty 1

## 2016-06-15 MED ORDER — MORPHINE SULFATE (PF) 4 MG/ML IV SOLN
4.0000 mg | Freq: Once | INTRAVENOUS | Status: AC
  Administered 2016-06-15: 4 mg via INTRAVENOUS
  Filled 2016-06-15: qty 1

## 2016-06-15 MED ORDER — METOCLOPRAMIDE HCL 5 MG/ML IJ SOLN
5.0000 mg | Freq: Once | INTRAMUSCULAR | Status: AC
  Administered 2016-06-15: 5 mg via INTRAVENOUS
  Filled 2016-06-15: qty 2

## 2016-06-15 MED ORDER — THIAMINE HCL 100 MG/ML IJ SOLN
100.0000 mg | Freq: Every day | INTRAMUSCULAR | Status: DC
  Filled 2016-06-15: qty 2

## 2016-06-15 MED ORDER — SODIUM CHLORIDE 0.9 % IV SOLN: INTRAVENOUS | Status: DC

## 2016-06-15 MED ORDER — LORAZEPAM 2 MG/ML IJ SOLN
2.0000 mg | Freq: Once | INTRAMUSCULAR | Status: AC
  Administered 2016-06-15: 2 mg via INTRAVENOUS
  Filled 2016-06-15: qty 1

## 2016-06-15 MED ORDER — ONDANSETRON HCL 4 MG/2ML IJ SOLN
4.0000 mg | Freq: Once | INTRAMUSCULAR | Status: AC | PRN
  Administered 2016-06-15: 4 mg via INTRAVENOUS
  Filled 2016-06-15: qty 2

## 2016-06-15 MED ORDER — LORAZEPAM 2 MG/ML IJ SOLN
1.0000 mg | Freq: Once | INTRAMUSCULAR | Status: AC
  Administered 2016-06-15: 1 mg via INTRAVENOUS
  Filled 2016-06-15: qty 1

## 2016-06-15 MED ORDER — ENOXAPARIN SODIUM 40 MG/0.4ML ~~LOC~~ SOLN
40.0000 mg | Freq: Every day | SUBCUTANEOUS | Status: DC
  Administered 2016-06-16 (×2): 40 mg via SUBCUTANEOUS
  Filled 2016-06-15 (×2): qty 0.4

## 2016-06-15 MED ORDER — DEXTROSE-NACL 5-0.45 % IV SOLN
INTRAVENOUS | Status: DC
  Administered 2016-06-16 – 2016-06-17 (×2): via INTRAVENOUS

## 2016-06-15 NOTE — ED Provider Notes (Signed)
[facility]-EMERGENCY DEPT Provider Note   CSN: [id number] Arrival date & time: 06/15/16  1747     History   Chief Complaint Chief Complaint  Patient presents with  . Abdominal Pain  . Emesis    HPI Bascom LevelsMark Maffia is a 50 y.o. male.  50 year old male presents with epigastric pain that began after he drank Coca-Cola as well as water. Patient is a heavy drinker and last use of alcohol was yesterday evening. He complains of tremors and crampy diffuse abdominal discomfort. Denies any hematemesis. Denies any black or bloody stools. No fever or chills. No homicidal or suicidal ideations. 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 Patient states that he wants to stop drinking and feels that he cannot do this at home.      Past Medical History:  Diagnosis Date  . Arthritis   . Attention deficit disorder   . Depression     There are no active problems to display for this patient.   Past Surgical History:  Procedure Laterality Date  . cervical fusion with plating x 2  2000  . left knee arthroscopy  1996   x 2  . lower back surgery  2011  . right arm surgery for donor skin graft  2000  . right foot surgery to clean out arthritis  2011  . right heel reconstruction  2000  . right heel surgery  to remove screw  2000  . right heel surgery for fx  2010  . right heel wound I and D for infection   2011       Home Medications    Prior to Admission medications   Medication Sig Start Date End Date Taking? Authorizing Provider  aspirin EC 81 MG tablet Take 81 mg by mouth daily.   Yes Historical Provider, MD  cholecalciferol (VITAMIN D) 1000 units tablet Take 1,000 Units by mouth every other day.   Yes Historical Provider, MD  ibuprofen (ADVIL,MOTRIN) 200 MG tablet Take 800 mg by mouth 3 (three) times daily as needed for mild pain or moderate pain.    Yes Historical Provider, MD  lisinopril-hydrochlorothiazide (PRINZIDE,ZESTORETIC) 20-12.5 MG tablet Take 1 tablet by  mouth daily.   Yes Historical Provider, MD  POTASSIUM PO Take 1 tablet by mouth daily.   Yes Historical Provider, MD  vitamin C (ASCORBIC ACID) 500 MG tablet Take 500 mg by mouth every other day.   Yes Historical Provider, MD    Family History History reviewed. No pertinent family history.  Social History Social History  Substance Use Topics  . Smoking status: Former Smoker    Packs/day: 0.25    Years: 1.00    Types: Cigarettes    Quit date: 10/26/1983  . Smokeless tobacco: Never Used  . Alcohol use Yes     Comment: every other day-glass of wine     Allergies   Review of patient's allergies indicates no known allergies.   Review of Systems Review of Systems  All other systems reviewed and are negative.    Physical Exam Updated Vital Signs BP 147/90   Pulse 84   Temp 98.4 F (36.9 C) (Oral)   Resp 16   Ht 6' (1.829 m)   Wt 81.6 kg   SpO2 100%   BMI 24.41 kg/m   Physical Exam  Constitutional: He is oriented to person, place, and time. He appears well-developed and well-nourished.  Non-toxic appearance. No distress.  HENT:  Head: Normocephalic and atraumatic.  Eyes:  Conjunctivae, EOM and lids are normal. Pupils are equal, round, and reactive to light.  Neck: Normal range of motion. Neck supple. No tracheal deviation present. No thyroid mass present.  Cardiovascular: Normal rate, regular rhythm and normal heart sounds.  Exam reveals no gallop.   No murmur heard. Pulmonary/Chest: Effort normal and breath sounds normal. No stridor. No respiratory distress. He has no decreased breath sounds. He has no wheezes. He has no rhonchi. He has no rales.  Abdominal: Soft. Normal appearance and bowel sounds are normal. He exhibits no distension. There is tenderness in the epigastric area. There is no rebound and no CVA tenderness.  Musculoskeletal: Normal range of motion. He exhibits no edema or tenderness.  Neurological: He is alert and oriented to person, place, and time. He  has normal strength. No cranial nerve deficit or sensory deficit. GCS eye subscore is 4. GCS verbal subscore is 5. GCS motor subscore is 6.  Skin: Skin is warm and dry. No abrasion and no rash noted.  Psychiatric: His speech is normal and behavior is normal. His mood appears anxious.  Nursing note and vitals reviewed.    ED Treatments / Results  Labs (all labs ordered are listed, but only abnormal results are displayed) Labs Reviewed  COMPREHENSIVE METABOLIC PANEL - Abnormal; Notable for the following:       Result Value   Chloride 94 (*)    Glucose, Bld 201 (*)    Calcium 10.8 (*)    Total Protein 8.6 (*)    AST 95 (*)    ALT 68 (*)    Total Bilirubin 1.7 (*)    Anion gap 18 (*)    All other components within normal limits  CBC - Abnormal; Notable for the following:    WBC 12.7 (*)    Platelets 121 (*)    All other components within normal limits  AMYLASE - Abnormal; Notable for the following:    Amylase 107 (*)    All other components within normal limits  LIPASE, BLOOD  ETHANOL  URINALYSIS, ROUTINE W REFLEX MICROSCOPIC (NOT AT [facility])    EKG  EKG Interpretation None       Radiology No results found.  Procedures Procedures (including critical care time)  Medications Ordered in ED Medications  0.9 %  sodium chloride infusion (not administered)  sodium chloride 0.9 % bolus 2,000 mL (not administered)  morphine 4 MG/ML injection 4 mg (not administered)  metoCLOPramide (REGLAN) injection 5 mg (not administered)  diphenhydrAMINE (BENADRYL) injection 12.5 mg (not administered)  ondansetron (ZOFRAN) injection 4 mg (4 mg Intravenous Given 06/15/16 1944)  LORazepam (ATIVAN) injection 2 mg (2 mg Intravenous Given 06/15/16 2000)     Initial Impression / Assessment and Plan / ED Course  I have reviewed the triage vital signs and the nursing notes.  Pertinent labs & imaging results that were available during my care of the patient were reviewed by me and considered in  my medical decision making (see chart for details).  Clinical Course    Patient treated with Ativan as well as medications for abdominal discomfort. Nausea treated as well too. Has evidence of dehydration based on his urinalysis. Does have a mild elevation of his transaminases likely from chronic alcohol abuse. Also mildly hyperglycemic. Lipase is normal limits. Will re-dose of Ativan here and admit for observation  Final Clinical Impressions(s) / ED Diagnoses   Final diagnoses:  None    New Prescriptions New Prescriptions   No medications on file  Lorre Nick, MD 06/15/16 2233

## 2016-06-15 NOTE — ED Notes (Signed)
Bed: [facility] Expected date:  Expected time:  Means of arrival:  Comments: Triage

## 2016-06-15 NOTE — ED Notes (Signed)
Pt is a heavy drinker and his last drink was last night with dinner, pt is shaking, sweating

## 2016-06-15 NOTE — ED Triage Notes (Signed)
Pt complains of vomiting since last night, about 15 times, he states he had some drinks but didn't eat anything out of the ordinary, no diarrhea

## 2016-06-15 NOTE — H&P (Signed)
History and Physical    Christopher Underwood RUE:[id number] DOB: 05/22/1966 DOA: 06/15/2016   PCP: Leanor RubensteinSUN,VYVYAN Y, MD Chief Complaint:  Chief Complaint  Patient presents with  . Abdominal Pain  . Emesis    HPI: Christopher Underwood is a 50 y.o. male with medical history significant of EtOH abuse.  Patient has been through withdrawals previously.  Heavy drinker, last drink yesterday evening and is trying to stop drinking.  He presents to ED after episode of emesis, onset of epigastric pain.  Symptoms onset this evening after drinking coca-cola and water.  He also has tremors as well.  ED Course: EtOH level of 0, lab work c/w mild alcoholic ketoacidosis.  Review of Systems: As per HPI otherwise 10 point review of systems negative.    Past Medical History:  Diagnosis Date  . Arthritis   . Attention deficit disorder   . Depression     Past Surgical History:  Procedure Laterality Date  . cervical fusion with plating x 2  2000  . left knee arthroscopy  1996   x 2  . lower back surgery  2011  . right arm surgery for donor skin graft  2000  . right foot surgery to clean out arthritis  2011  . right heel reconstruction  2000  . right heel surgery  to remove screw  2000  . right heel surgery for fx  2010  . right heel wound I and D for infection   2011     reports that he quit smoking about 32 years ago. His smoking use included Cigarettes. He has a 0.25 pack-year smoking history. He has never used smokeless tobacco. He reports that he drinks alcohol. He reports that he does not use drugs.  No Known Allergies  History reviewed. No pertinent family history. No history relating to his alcoholic ketoacidosis, wife is present and supportive at bedside.   Prior to Admission medications   Medication Sig Start Date End Date Taking? Authorizing Provider  aspirin EC 81 MG tablet Take 81 mg by mouth daily.   Yes Historical Provider, MD  cholecalciferol (VITAMIN D) 1000 units tablet Take 1,000 Units by mouth  every other day.   Yes Historical Provider, MD  ibuprofen (ADVIL,MOTRIN) 200 MG tablet Take 800 mg by mouth 3 (three) times daily as needed for mild pain or moderate pain.    Yes Historical Provider, MD  lisinopril-hydrochlorothiazide (PRINZIDE,ZESTORETIC) 20-12.5 MG tablet Take 1 tablet by mouth daily.   Yes Historical Provider, MD  POTASSIUM PO Take 1 tablet by mouth daily.   Yes Historical Provider, MD  vitamin C (ASCORBIC ACID) 500 MG tablet Take 500 mg by mouth every other day.   Yes Historical Provider, MD    Physical Exam: Vitals:   06/15/16 1932 06/15/16 2127 06/15/16 2202 06/15/16 2237  BP:  147/90 139/88 139/83  Pulse:  84 78 81  Resp:  16 16 16   Temp:      TempSrc:      SpO2:  100% 99% 100%  Weight: 81.6 kg (180 lb)     Height: 6' (1.829 m)         Constitutional: NAD, calm, comfortable Eyes: PERRL, lids and conjunctivae normal ENMT: Mucous membranes are moist. Posterior pharynx clear of any exudate or lesions.Normal dentition.  Neck: normal, supple, no masses, no thyromegaly Respiratory: clear to auscultation bilaterally, no wheezing, no crackles. Normal respiratory effort. No accessory muscle use.  Cardiovascular: Regular rate and rhythm, no murmurs / rubs / gallops. No  extremity edema. 2+ pedal pulses. No carotid bruits.  Abdomen: no tenderness, no masses palpated. No hepatosplenomegaly. Bowel sounds positive.  Musculoskeletal: no clubbing / cyanosis. No joint deformity upper and lower extremities. Good ROM, no contractures. Normal muscle tone.  Skin: no rashes, lesions, ulcers. No induration Neurologic: Mild tremor Psychiatric: Normal judgment and insight. Alert and oriented x 3. Normal mood.    Labs on Admission: I have personally reviewed following labs and imaging studies  CBC:  Recent Labs Lab 06/15/16 1936  WBC 12.7*  HGB 15.0  HCT 42.2  MCV 95.7  PLT 121*   Basic Metabolic Panel:  Recent Labs Lab 06/15/16 1936  NA 135  K 4.5  CL 94*  CO2 23   GLUCOSE 201*  BUN 18  CREATININE 0.93  CALCIUM 10.8*   GFR: Estimated Creatinine Clearance: 104.3 mL/min (by C-G formula based on SCr of 0.93 mg/dL). Liver Function Tests:  Recent Labs Lab 06/15/16 1936  AST 95*  ALT 68*  ALKPHOS 76  BILITOT 1.7*  PROT 8.6*  ALBUMIN 4.7    Recent Labs Lab 06/15/16 1936  LIPASE 29  AMYLASE 107*   No results for input(s): AMMONIA in the last 168 hours. Coagulation Profile: No results for input(s): INR, PROTIME in the last 168 hours. Cardiac Enzymes: No results for input(s): CKTOTAL, CKMB, CKMBINDEX, TROPONINI in the last 168 hours. BNP (last 3 results) No results for input(s): PROBNP in the last 8760 hours. HbA1C: No results for input(s): HGBA1C in the last 72 hours. CBG: No results for input(s): GLUCAP in the last 168 hours. Lipid Profile: No results for input(s): CHOL, HDL, LDLCALC, TRIG, CHOLHDL, LDLDIRECT in the last 72 hours. Thyroid Function Tests: No results for input(s): TSH, T4TOTAL, FREET4, T3FREE, THYROIDAB in the last 72 hours. Anemia Panel: No results for input(s): VITAMINB12, FOLATE, FERRITIN, TIBC, IRON, RETICCTPCT in the last 72 hours. Urine analysis:    Component Value Date/Time   COLORURINE AMBER (A) 06/15/2016 2122   APPEARANCEUR CLOUDY (A) 06/15/2016 2122   LABSPEC 1.026 06/15/2016 2122   PHURINE 6.0 06/15/2016 2122   GLUCOSEU 100 (A) 06/15/2016 2122   HGBUR NEGATIVE 06/15/2016 2122   BILIRUBINUR SMALL (A) 06/15/2016 2122   KETONESUR >80 (A) 06/15/2016 2122   PROTEINUR 30 (A) 06/15/2016 2122   UROBILINOGEN 0.2 05/30/2012 1350   NITRITE NEGATIVE 06/15/2016 2122   LEUKOCYTESUR NEGATIVE 06/15/2016 2122   Sepsis Labs: @LABRCNTIP (procalcitonin:4,lacticidven:4) )No results found for this or any previous visit (from the past 240 hour(s)).   Radiological Exams on Admission: No results found.  EKG: Independently reviewed.  Assessment/Plan Principal Problem:   Alcoholic ketoacidosis Active Problems:    Alcohol withdrawal (HCC)    1. Alcoholic ketoacidosis - 1. Q4H BMP x3 draws 2. D5 half at 75 cc/hr 2. EtOH withdrawal - 1. CIWA 2. Tele monitor   DVT prophylaxis: Lovenox Code Status: Full Family Communication: Wife at bedside Consults called: None Admission status: Admit to inpatient   Hillary BowGARDNER, Harim Bi M. DO [facility] Pager [phone] from 7PM-7AM  If 7AM-7PM, please contact the day physician for the patient [email or web address] Password [email or web address]  06/15/2016, 11:47 PM

## 2016-06-16 ENCOUNTER — Encounter (HOSPITAL_COMMUNITY): Payer: Self-pay | Admitting: Radiology

## 2016-06-16 ENCOUNTER — Inpatient Hospital Stay (HOSPITAL_COMMUNITY): Payer: Medicaid Other

## 2016-06-16 DIAGNOSIS — E876 Hypokalemia: Secondary | ICD-10-CM

## 2016-06-16 DIAGNOSIS — R1084 Generalized abdominal pain: Secondary | ICD-10-CM

## 2016-06-16 DIAGNOSIS — F10230 Alcohol dependence with withdrawal, uncomplicated: Secondary | ICD-10-CM

## 2016-06-16 DIAGNOSIS — R109 Unspecified abdominal pain: Secondary | ICD-10-CM

## 2016-06-16 DIAGNOSIS — E872 Acidosis: Principal | ICD-10-CM

## 2016-06-16 LAB — BASIC METABOLIC PANEL
Anion gap: 7 (ref 5–15)
Anion gap: 8 (ref 5–15)
Anion gap: 6 (ref 5–15)
BUN: 17 mg/dL (ref 6–20)
BUN: 18 mg/dL (ref 6–20)
BUN: 15 mg/dL (ref 6–20)
CALCIUM: 8.7 mg/dL — AB (ref 8.9–10.3)
CHLORIDE: 101 mmol/L (ref 101–111)
CHLORIDE: 103 mmol/L (ref 101–111)
CHLORIDE: 104 mmol/L (ref 101–111)
CO2: 26 mmol/L (ref 22–32)
CO2: 26 mmol/L (ref 22–32)
CO2: 27 mmol/L (ref 22–32)
CREATININE: 0.63 mg/dL (ref 0.61–1.24)
CREATININE: 0.68 mg/dL (ref 0.61–1.24)
CREATININE: 0.75 mg/dL (ref 0.61–1.24)
Calcium: 8.6 mg/dL — ABNORMAL LOW (ref 8.9–10.3)
Calcium: 8.7 mg/dL — ABNORMAL LOW (ref 8.9–10.3)
GFR calc non Af Amer: >60 mL/min (ref >60)
Glucose, Bld: 134 mg/dL — ABNORMAL HIGH (ref 65–99)
Glucose, Bld: 159 mg/dL — ABNORMAL HIGH (ref 65–99)
Glucose, Bld: 161 mg/dL — ABNORMAL HIGH (ref 65–99)
POTASSIUM: 4 mmol/L (ref 3.5–5.1)
Potassium: 3.4 mmol/L — ABNORMAL LOW (ref 3.5–5.1)
Potassium: 3.4 mmol/L — ABNORMAL LOW (ref 3.5–5.1)
SODIUM: 134 mmol/L — AB (ref 135–145)
SODIUM: 137 mmol/L (ref 135–145)
SODIUM: 137 mmol/L (ref 135–145)

## 2016-06-16 MED ORDER — DIATRIZOATE MEGLUMINE & SODIUM 66-10 % PO SOLN
15.0000 mL | ORAL | Status: DC | PRN
  Administered 2016-06-16: 15 mL via ORAL

## 2016-06-16 MED ORDER — IOPAMIDOL (ISOVUE-300) INJECTION 61%
100.0000 mL | Freq: Once | INTRAVENOUS | Status: AC | PRN
  Administered 2016-06-16: 100 mL via INTRAVENOUS

## 2016-06-16 MED ORDER — CHLORDIAZEPOXIDE HCL 25 MG PO CAPS
50.0000 mg | ORAL_CAPSULE | Freq: Once | ORAL | Status: AC
  Administered 2016-06-16: 50 mg via ORAL
  Filled 2016-06-16: qty 2

## 2016-06-16 MED ORDER — ONDANSETRON HCL 4 MG/2ML IJ SOLN
4.0000 mg | Freq: Four times a day (QID) | INTRAMUSCULAR | Status: DC | PRN
  Administered 2016-06-16 – 2016-06-17 (×2): 4 mg via INTRAVENOUS
  Filled 2016-06-16 (×2): qty 2

## 2016-06-16 MED ORDER — PANTOPRAZOLE SODIUM 40 MG PO TBEC
40.0000 mg | DELAYED_RELEASE_TABLET | Freq: Every day | ORAL | Status: DC
  Administered 2016-06-16: 40 mg via ORAL
  Filled 2016-06-16: qty 1

## 2016-06-16 MED ORDER — HYDROCODONE-ACETAMINOPHEN 5-325 MG PO TABS
1.0000 | ORAL_TABLET | Freq: Four times a day (QID) | ORAL | Status: DC | PRN
  Administered 2016-06-16 – 2016-06-17 (×4): 1 via ORAL
  Filled 2016-06-16 (×4): qty 1

## 2016-06-16 MED ORDER — ONDANSETRON HCL 4 MG/2ML IJ SOLN
4.0000 mg | Freq: Four times a day (QID) | INTRAMUSCULAR | Status: DC | PRN
  Administered 2016-06-16: 4 mg via INTRAVENOUS
  Filled 2016-06-16: qty 2

## 2016-06-16 MED ORDER — POTASSIUM CHLORIDE CRYS ER 20 MEQ PO TBCR
20.0000 meq | EXTENDED_RELEASE_TABLET | Freq: Once | ORAL | Status: AC
  Administered 2016-06-16: 20 meq via ORAL
  Filled 2016-06-16: qty 1

## 2016-06-16 NOTE — Progress Notes (Signed)
At patient's agreement, 2 drops lavender eo placed on gauze in paper med cup and given to pt for inhalation aromatherapy for anxiety.

## 2016-06-16 NOTE — Progress Notes (Signed)
Patient ID: Bascom LevelsMark Underwood, male   DOB: 01/03/1966, 50 y.o.   MRN: [id number]                                                                PROGRESS NOTE                                                                                                                                                                                                             Patient Demographics:    Bascom LevelsMark Underwood, is a 50 y.o. male, DOB - 04/02/1966, WUJ:[id number]  Admit date - 06/15/2016   Admitting Physician Hillary BowJared M Gardner, DO  Outpatient Primary MD for the patient is Leanor RubensteinSUN,VYVYAN Y, MD  LOS - 1  Outpatient Specialists:   Chief Complaint  Patient presents with  . Abdominal Pain  . Emesis       Brief Narrative  50 y.o. male with medical history significant of EtOH abuse.  Patient has been through withdrawals previously.  Heavy drinker, last drink yesterday evening and is trying to stop drinking.  He presents to ED after episode of emesis, onset of epigastric pain.  Symptoms onset this evening after drinking coca-cola and water.  He also has tremors as well.  ED Course: EtOH level of 0, lab work c/w mild alcoholic ketoacidosis.   Subjective:    [name] today has feeling slightly shaky, + abdomiinal pain, generalized.  + heartburn.  Denies diarrhea, brbpr, black stool.   No headache, No chest pain,  No Nausea, No new weakness tingling or numbness, No Cough - SOB.    Assessment  & Plan :    Principal Problem:   Alcoholic ketoacidosis Active Problems:   Alcohol withdrawal (HCC)   Hypokalemia   1. Etoh dep/ withdrawal Libruim 50mg  po x1 Cont ciwa Cont Ativan  Cont thiamine, folate, b12  2. Hypokalemia Replete Check cmp inam  3. Abnormal lft Check acute hepatitis panel Check ct scan  4. Thrombocytopenia Likely secondary to etoh use  5. Abdominal discomfort (generalized) Check CT scan abd / pelvis Start protonix 40mg  po qday zofran 4mg  iv q6h prn n/v  Code Status : FULL CODE  Family  Communication  :   Disposition Plan  : home  Barriers For Discharge :   Consults  :  Procedures  :   DVT Prophylaxis  :  Lovenox -SCDs   Lab Results  Component Value Date   PLT 121 (L) 06/15/2016    Antibiotics  :    Anti-infectives    None        Objective:   Vitals:   06/15/16 2237 06/16/16 0113 06/16/16 0135 06/16/16 0533  BP: 139/83 131/85 140/75 122/73  Pulse: 81 79 66 65  Resp: 16 16 18 16   Temp:   98.4 F (36.9 C) 98.4 F (36.9 C)  TempSrc:   Oral Oral  SpO2: 100% 100% 100% 96%  Weight:   75.4 kg (166 lb 3.6 oz)   Height:   6' (1.829 m)     Wt Readings from Last 3 Encounters:  06/16/16 75.4 kg (166 lb 3.6 oz)  05/30/12 78 kg (171 lb 14.4 oz)     Intake/Output Summary (Last 24 hours) at 06/16/16 0640 Last data filed at 06/16/16 0600  Gross per 24 hour  Intake              550 ml  Output                0 ml  Net              550 ml     Physical Exam  Awake Alert, Oriented X 3, No new F.N deficits, Normal affect [place].AT,PERRAL Supple Neck,No JVD, No cervical lymphadenopathy appriciated.  Symmetrical Chest wall movement, Good air movement bilaterally, CTAB RRR,No Gallops,Rubs or new Murmurs, No Parasternal Heave +ve B.Sounds, Abd Soft, No tenderness, No organomegaly appriciated, No rebound - guarding or rigidity. No Cyanosis, Clubbing or edema, No new Rash or bruise       Data Review:    CBC  Recent Labs Lab 06/15/16 1936  WBC 12.7*  HGB 15.0  HCT 42.2  PLT 121*  MCV 95.7  MCH 34.0  MCHC 35.5  RDW 12.4    Chemistries   Recent Labs Lab 06/15/16 1936 06/16/16 0043 06/16/16 0412  NA 135 137 137  K 4.5 4.0 3.4*  CL 94* 104 103  CO2 23 26 26   GLUCOSE 201* 134* 159*  BUN 18 17 18   CREATININE 0.93 0.63 0.68  CALCIUM 10.8* 8.6* 8.7*  AST 95*  --   --   ALT 68*  --   --   ALKPHOS 76  --   --   BILITOT 1.7*  --   --     ------------------------------------------------------------------------------------------------------------------ No results for input(s): CHOL, HDL, LDLCALC, TRIG, CHOLHDL, LDLDIRECT in the last 72 hours.  No results found for: HGBA1C ------------------------------------------------------------------------------------------------------------------ No results for input(s): TSH, T4TOTAL, T3FREE, THYROIDAB in the last 72 hours.  Invalid input(s): FREET3 ------------------------------------------------------------------------------------------------------------------ No results for input(s): VITAMINB12, FOLATE, FERRITIN, TIBC, IRON, RETICCTPCT in the last 72 hours.  Coagulation profile No results for input(s): INR, PROTIME in the last 168 hours.  No results for input(s): DDIMER in the last 72 hours.  Cardiac Enzymes No results for input(s): CKMB, TROPONINI, MYOGLOBIN in the last 168 hours.  Invalid input(s): CK ------------------------------------------------------------------------------------------------------------------ No results found for: BNP  Inpatient Medications  Scheduled Meds: . enoxaparin (LOVENOX) injection  40 mg Subcutaneous QHS  . folic acid  1 mg Oral Daily  . LORazepam  0-4 mg Intravenous Q6H   Followed by  . [START ON 06/18/2016] LORazepam  0-4 mg Intravenous Q12H  . multivitamin with minerals  1 tablet Oral Daily  . thiamine  100 mg Oral Daily  Or  . thiamine  100 mg Intravenous Daily   Continuous Infusions: . dextrose 5 % and 0.45% NaCl 75 mL/hr at 06/16/16 0152   PRN Meds:.HYDROcodone-acetaminophen, LORazepam **OR** LORazepam, ondansetron (ZOFRAN) IV  Micro Results No results found for this or any previous visit (from the past 240 hour(s)).  Radiology Reports No results found.  Time Spent in minutes  30   Pearson GrippeJames Rei Medlen M.D on 06/16/2016 at 6:40 AM  Between 7am to 7pm - Pager - [phone]  After 7pm go to [email or web address] - password  [email or web address]  [facility] -  Office  [phone]

## 2016-06-17 LAB — CBC
HCT: 36.2 % — ABNORMAL LOW (ref 39.0–52.0)
Hemoglobin: 12.7 g/dL — ABNORMAL LOW (ref 13.0–17.0)
MCH: 34.2 pg — ABNORMAL HIGH (ref 26.0–34.0)
MCHC: 35.1 g/dL (ref 30.0–36.0)
MCV: 97.6 fL (ref 78.0–100.0)
PLATELETS: 82 10*3/uL — AB (ref 150–400)
RBC: 3.71 MIL/uL — ABNORMAL LOW (ref 4.22–5.81)
RDW: 12.5 % (ref 11.5–15.5)
WBC: 6.4 10*3/uL (ref 4.0–10.5)

## 2016-06-17 LAB — COMPREHENSIVE METABOLIC PANEL
ALT: 39 U/L (ref 17–63)
ANION GAP: 9 (ref 5–15)
AST: 51 U/L — AB (ref 15–41)
Albumin: 3.6 g/dL (ref 3.5–5.0)
Alkaline Phosphatase: 71 U/L (ref 38–126)
BUN: 11 mg/dL (ref 6–20)
CHLORIDE: 104 mmol/L (ref 101–111)
CO2: 22 mmol/L (ref 22–32)
Calcium: 8.9 mg/dL (ref 8.9–10.3)
Creatinine, Ser: 0.96 mg/dL (ref 0.61–1.24)
GFR calc Af Amer: >60 mL/min (ref >60)
Glucose, Bld: 169 mg/dL — ABNORMAL HIGH (ref 65–99)
POTASSIUM: 2.8 mmol/L — AB (ref 3.5–5.1)
Sodium: 135 mmol/L (ref 135–145)
Total Bilirubin: 0.7 mg/dL (ref 0.3–1.2)
Total Protein: 6.6 g/dL (ref 6.5–8.1)

## 2016-06-17 NOTE — Progress Notes (Addendum)
Pt.has been restless and didn't get much sleep. Symptoms unrelieved by anti-anxiety medication. Pt then got dressed, pulled his telemetry off and removed his IV line stating he wanted to leave. Pt educated on consequences of leaving and encouraged to stay until he is seen by the MD this morning. Pt refused, packed his belongings, signed AMA papers and left the hospital.Jeliyah Middlebrooks   NP K. Schorr notified. Reached out to pt's emergency contact Lorraine Lax'Julie Carslon at [phone] and [phone] regarding the patient leaving AMA. Both calls were forwarded to voicemail.

## 2016-06-18 ENCOUNTER — Emergency Department (HOSPITAL_COMMUNITY)
Admission: EM | Admit: 2016-06-18 | Discharge: 2016-06-18 | Disposition: A | Discharge status: Home / Self Care | Payer: Medicaid Other | Attending: Emergency Medicine | Admitting: Emergency Medicine

## 2016-06-18 ENCOUNTER — Encounter (HOSPITAL_COMMUNITY): Payer: Self-pay | Admitting: Emergency Medicine

## 2016-06-18 ENCOUNTER — Emergency Department (HOSPITAL_COMMUNITY): Payer: Medicaid Other

## 2016-06-18 DIAGNOSIS — Z79899 Other long term (current) drug therapy: Secondary | ICD-10-CM | POA: Insufficient documentation

## 2016-06-18 DIAGNOSIS — Z87891 Personal history of nicotine dependence: Secondary | ICD-10-CM | POA: Diagnosis not present

## 2016-06-18 DIAGNOSIS — Z7982 Long term (current) use of aspirin: Secondary | ICD-10-CM | POA: Insufficient documentation

## 2016-06-18 DIAGNOSIS — F909 Attention-deficit hyperactivity disorder, unspecified type: Secondary | ICD-10-CM | POA: Diagnosis not present

## 2016-06-18 DIAGNOSIS — Z791 Long term (current) use of non-steroidal anti-inflammatories (NSAID): Secondary | ICD-10-CM | POA: Insufficient documentation

## 2016-06-18 DIAGNOSIS — R51 Headache: Secondary | ICD-10-CM | POA: Diagnosis not present

## 2016-06-18 DIAGNOSIS — F1010 Alcohol abuse, uncomplicated: Secondary | ICD-10-CM | POA: Diagnosis not present

## 2016-06-18 DIAGNOSIS — Z046 Encounter for general psychiatric examination, requested by authority: Secondary | ICD-10-CM | POA: Diagnosis present

## 2016-06-18 LAB — URINALYSIS, ROUTINE W REFLEX MICROSCOPIC
Glucose, UA: NEGATIVE mg/dL
Hgb urine dipstick: NEGATIVE
Ketones, ur: NEGATIVE mg/dL
Leukocytes, UA: NEGATIVE
Nitrite: NEGATIVE
Protein, ur: NEGATIVE mg/dL
Specific Gravity, Urine: 1.014 (ref 1.005–1.030)
pH: 6.5 (ref 5.0–8.0)

## 2016-06-18 LAB — COMPREHENSIVE METABOLIC PANEL
ALT: 49 U/L (ref 17–63)
AST: 56 U/L — ABNORMAL HIGH (ref 15–41)
Albumin: 3.6 g/dL (ref 3.5–5.0)
Alkaline Phosphatase: 65 U/L (ref 38–126)
Anion gap: 9 (ref 5–15)
BUN: 12 mg/dL (ref 6–20)
CO2: 24 mmol/L (ref 22–32)
Calcium: 9 mg/dL (ref 8.9–10.3)
Chloride: 104 mmol/L (ref 101–111)
Creatinine, Ser: 0.8 mg/dL (ref 0.61–1.24)
GFR calc Af Amer: >60 mL/min (ref >60)
GFR calc non Af Amer: >60 mL/min (ref >60)
Glucose, Bld: 94 mg/dL (ref 65–99)
Potassium: 3 mmol/L — ABNORMAL LOW (ref 3.5–5.1)
Sodium: 137 mmol/L (ref 135–145)
Total Bilirubin: 0.7 mg/dL (ref 0.3–1.2)
Total Protein: 6.8 g/dL (ref 6.5–8.1)

## 2016-06-18 LAB — BLOOD GAS, VENOUS
Acid-Base Excess: 1.1 mmol/L (ref 0.0–2.0)
Bicarbonate: 25.1 mEq/L — ABNORMAL HIGH (ref 20.0–24.0)
FIO2: 21
O2 Saturation: 73.8 %
Patient temperature: 98.6
TCO2: 22.5 mmol/L (ref 0–100)
pCO2, Ven: 39.5 mmHg — ABNORMAL LOW (ref 45.0–50.0)
pH, Ven: 7.418 — ABNORMAL HIGH (ref 7.250–7.300)
pO2, Ven: 39.7 mmHg (ref 31.0–45.0)

## 2016-06-18 LAB — CBC WITH DIFFERENTIAL/PLATELET
Basophils Absolute: 0 10*3/uL (ref 0.0–0.1)
Basophils Relative: 0 %
Eosinophils Absolute: 0 10*3/uL (ref 0.0–0.7)
Eosinophils Relative: 0 %
HCT: 37.1 % — ABNORMAL LOW (ref 39.0–52.0)
Hemoglobin: 13 g/dL (ref 13.0–17.0)
Lymphocytes Relative: 12 %
Lymphs Abs: 0.8 10*3/uL (ref 0.7–4.0)
MCH: 33.9 pg (ref 26.0–34.0)
MCHC: 35 g/dL (ref 30.0–36.0)
MCV: 96.6 fL (ref 78.0–100.0)
Monocytes Absolute: 0.9 10*3/uL (ref 0.1–1.0)
Monocytes Relative: 12 %
Neutro Abs: 5.5 10*3/uL (ref 1.7–7.7)
Neutrophils Relative %: 76 %
Platelets: 91 10*3/uL — ABNORMAL LOW (ref 150–400)
RBC: 3.84 MIL/uL — ABNORMAL LOW (ref 4.22–5.81)
RDW: 12.4 % (ref 11.5–15.5)
WBC: 7.3 10*3/uL (ref 4.0–10.5)

## 2016-06-18 LAB — HEPATITIS PANEL, ACUTE
HEP A IGM: NEGATIVE
HEP B S AG: NEGATIVE
Hep B C IgM: NEGATIVE

## 2016-06-18 LAB — RAPID URINE DRUG SCREEN, HOSP PERFORMED
Amphetamines: NOT DETECTED
Barbiturates: NOT DETECTED
Benzodiazepines: POSITIVE — AB
Cocaine: NOT DETECTED
Opiates: NOT DETECTED
Tetrahydrocannabinol: NOT DETECTED

## 2016-06-18 LAB — ETHANOL: Alcohol, Ethyl (B): <5 mg/dL (ref <5)

## 2016-06-18 MED ORDER — POTASSIUM 75 MG PO TABS: 1.0000 | ORAL_TABLET | Freq: Every day | ORAL | Status: DC

## 2016-06-18 MED ORDER — PROCHLORPERAZINE EDISYLATE 5 MG/ML IJ SOLN
10.0000 mg | Freq: Once | INTRAMUSCULAR | Status: AC
  Administered 2016-06-18: 10 mg via INTRAVENOUS
  Filled 2016-06-18: qty 2

## 2016-06-18 MED ORDER — ONDANSETRON HCL 4 MG PO TABS: 4.0000 mg | ORAL_TABLET | Freq: Three times a day (TID) | ORAL | Status: DC | PRN

## 2016-06-18 MED ORDER — ALUM & MAG HYDROXIDE-SIMETH 200-200-20 MG/5ML PO SUSP: 30.0000 mL | ORAL | Status: DC | PRN

## 2016-06-18 MED ORDER — VITAMIN D3 25 MCG (1000 UNIT) PO TABS: 1000.0000 [IU] | ORAL_TABLET | ORAL | Status: DC

## 2016-06-18 MED ORDER — LORAZEPAM 1 MG PO TABS: 0.0000 mg | ORAL_TABLET | Freq: Two times a day (BID) | ORAL | Status: DC

## 2016-06-18 MED ORDER — LISINOPRIL 20 MG PO TABS: 20.0000 mg | ORAL_TABLET | Freq: Every day | ORAL | Status: DC

## 2016-06-18 MED ORDER — LORAZEPAM 1 MG PO TABS
0.0000 mg | ORAL_TABLET | Freq: Four times a day (QID) | ORAL | Status: DC
  Administered 2016-06-18: 2 mg via ORAL
  Filled 2016-06-18: qty 2

## 2016-06-18 MED ORDER — SODIUM CHLORIDE 0.9 % IV BOLUS (SEPSIS)
1000.0000 mL | Freq: Once | INTRAVENOUS | Status: AC
  Administered 2016-06-18: 1000 mL via INTRAVENOUS

## 2016-06-18 MED ORDER — LISINOPRIL-HYDROCHLOROTHIAZIDE 20-12.5 MG PO TABS: 1.0000 | ORAL_TABLET | Freq: Every day | ORAL | Status: DC

## 2016-06-18 MED ORDER — IBUPROFEN 200 MG PO TABS: 600.0000 mg | ORAL_TABLET | Freq: Three times a day (TID) | ORAL | Status: DC | PRN

## 2016-06-18 MED ORDER — ZOLPIDEM TARTRATE 5 MG PO TABS: 5.0000 mg | ORAL_TABLET | Freq: Every evening | ORAL | Status: DC | PRN

## 2016-06-18 MED ORDER — KETOROLAC TROMETHAMINE 30 MG/ML IJ SOLN
30.0000 mg | Freq: Once | INTRAMUSCULAR | Status: AC
  Administered 2016-06-18: 30 mg via INTRAVENOUS
  Filled 2016-06-18: qty 1

## 2016-06-18 MED ORDER — ASPIRIN EC 81 MG PO TBEC: 81.0000 mg | DELAYED_RELEASE_TABLET | Freq: Every day | ORAL | Status: DC

## 2016-06-18 MED ORDER — THIAMINE HCL 100 MG/ML IJ SOLN: 100.0000 mg | Freq: Every day | INTRAMUSCULAR | Status: DC

## 2016-06-18 MED ORDER — POTASSIUM CHLORIDE CRYS ER 10 MEQ PO TBCR: 10.0000 meq | EXTENDED_RELEASE_TABLET | Freq: Every day | ORAL | Status: DC

## 2016-06-18 MED ORDER — HYDROCHLOROTHIAZIDE 12.5 MG PO CAPS: 12.5000 mg | ORAL_CAPSULE | Freq: Every day | ORAL | Status: DC

## 2016-06-18 MED ORDER — ACETAMINOPHEN 325 MG PO TABS: 650.0000 mg | ORAL_TABLET | ORAL | Status: DC | PRN

## 2016-06-18 MED ORDER — VITAMIN C 500 MG PO TABS: 500.0000 mg | ORAL_TABLET | ORAL | Status: DC

## 2016-06-18 MED ORDER — VITAMIN B-1 100 MG PO TABS
100.0000 mg | ORAL_TABLET | Freq: Every day | ORAL | Status: DC
  Administered 2016-06-18: 100 mg via ORAL
  Filled 2016-06-18: qty 1

## 2016-06-18 NOTE — ED Notes (Signed)
MD at bedside. 

## 2016-06-18 NOTE — BH Assessment (Signed)
Assessment completed. Consulted Maryjean Mornharles Kober, PA-C who reported that pt does not meet inpatient criteria at this time. Pt should be provided with outpatient resources. Informed Ebbie Ridgehris Lawyer, PA-C of the recommendation. Will provide pt with substance abuse resources.

## 2016-06-18 NOTE — ED Notes (Signed)
PA at bedside for assessment.   Pt. Has been IVC'd by girlfriend and brought by police. In [employer] Custody
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
## 2016-06-18 NOTE — Discharge Instructions (Signed)
Return here as needed.  Follow-up with your primary doctor in the resources provided

## 2016-06-18 NOTE — ED Triage Notes (Addendum)
Pt brought in by [employer] IVC'd. Pt is normally a daily drinker, but has not had a drink since Monday. Pt has tremors, HA, inability to keep down fluids in triage. PT denies SI/HI
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
## 2017-01-05 ENCOUNTER — Emergency Department (HOSPITAL_COMMUNITY)
Admission: EM | Admit: 2017-01-05 | Discharge: 2017-01-05 | Disposition: A | Discharge status: Home / Self Care | Payer: Medicaid Other | Attending: Emergency Medicine | Admitting: Emergency Medicine

## 2017-01-05 ENCOUNTER — Encounter (HOSPITAL_COMMUNITY): Payer: Self-pay | Admitting: Emergency Medicine

## 2017-01-05 DIAGNOSIS — Z79899 Other long term (current) drug therapy: Secondary | ICD-10-CM | POA: Insufficient documentation

## 2017-01-05 DIAGNOSIS — M5442 Lumbago with sciatica, left side: Secondary | ICD-10-CM | POA: Insufficient documentation

## 2017-01-05 DIAGNOSIS — Z87891 Personal history of nicotine dependence: Secondary | ICD-10-CM | POA: Diagnosis not present

## 2017-01-05 DIAGNOSIS — F909 Attention-deficit hyperactivity disorder, unspecified type: Secondary | ICD-10-CM | POA: Insufficient documentation

## 2017-01-05 DIAGNOSIS — Z7982 Long term (current) use of aspirin: Secondary | ICD-10-CM | POA: Diagnosis not present

## 2017-01-05 DIAGNOSIS — M545 Low back pain: Secondary | ICD-10-CM | POA: Diagnosis present

## 2017-01-05 MED ORDER — BUPIVACAINE HCL (PF) 0.5 % IJ SOLN
20.0000 mL | Freq: Once | INTRAMUSCULAR | Status: AC
  Administered 2017-01-05: 20 mL
  Filled 2017-01-05: qty 30

## 2017-01-05 MED ORDER — ACETAMINOPHEN 500 MG PO TABS
1000.0000 mg | ORAL_TABLET | Freq: Once | ORAL | Status: AC
  Administered 2017-01-05: 1000 mg via ORAL
  Filled 2017-01-05: qty 2

## 2017-01-05 MED ORDER — ACETAMINOPHEN 500 MG PO TABS: 1000.0000 mg | ORAL_TABLET | Freq: Four times a day (QID) | ORAL | 0 refills | Status: DC | PRN

## 2017-01-05 MED ORDER — KETOROLAC TROMETHAMINE 60 MG/2ML IM SOLN
60.0000 mg | Freq: Once | INTRAMUSCULAR | Status: AC
  Administered 2017-01-05: 60 mg via INTRAMUSCULAR
  Filled 2017-01-05: qty 2

## 2017-01-05 MED ORDER — IBUPROFEN 800 MG PO TABS: 800.0000 mg | ORAL_TABLET | Freq: Four times a day (QID) | ORAL | 0 refills | Status: DC | PRN

## 2017-01-05 NOTE — ED Notes (Signed)
Bed: WA21 Expected date:  Expected time:  Means of arrival:  Comments: 51 yo m back pain, fentanyl given

## 2017-01-05 NOTE — ED Provider Notes (Signed)
[facility]-EMERGENCY DEPT Provider Note   CSN: [id number] Arrival date & time: 01/05/17  1758     History   Chief Complaint Chief Complaint  Patient presents with  . Back Pain    HPI Christopher Underwood is a 51 y.o. male.  The history is provided by the patient.  Back Pain   This is a new problem. The current episode started 3 to 5 hours ago. The problem occurs constantly. The problem has not changed since onset.The pain is associated with twisting. The pain is present in the lumbar spine. The quality of the pain is described as stabbing. The pain radiates to the left thigh. The pain is moderate. The symptoms are aggravated by twisting. The pain is the same all the time. Pertinent negatives include no fever, no numbness, no bowel incontinence, no bladder incontinence and no weakness. He has tried nothing for the symptoms.    Past Medical History:  Diagnosis Date  . Arthritis   . Attention deficit disorder   . Depression     Patient Active Problem List   Diagnosis Date Noted  . Hypokalemia 06/16/2016  . Abdominal pain   . 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 cervical fusion with plating x 2  2000  . left knee arthroscopy  1996   x 2  . lower back surgery  2011  . right arm surgery for donor skin graft  2000  . right foot surgery to clean out arthritis  2011  . right heel reconstruction  2000  . right heel surgery  to remove screw  2000  . right heel surgery for fx  2010  . right heel wound I and D for infection   2011       Home Medications    Prior to Admission medications   Medication Sig Start Date End Date Taking? Authorizing Provider  aspirin EC 81 MG tablet Take 81 mg by mouth daily.   Yes Historical Provider, MD  cholecalciferol (VITAMIN D) 1000 units tablet Take 1,000 Units by mouth every other day.   Yes Historical Provider, MD  ibuprofen (ADVIL,MOTRIN) 200 MG tablet Take  800 mg by mouth 3 (three) times daily as needed for mild pain or moderate pain.    Yes Historical Provider, MD  lisinopril-hydrochlorothiazide (PRINZIDE,ZESTORETIC) 20-12.5 MG tablet Take 1 tablet by mouth daily.   Yes Historical Provider, MD  POTASSIUM PO Take 1 tablet by mouth daily.   Yes Historical Provider, MD  vitamin C (ASCORBIC ACID) 500 MG tablet Take 500 mg by mouth every other day.   Yes Historical Provider, MD    Family History History reviewed. No pertinent family history.  Social History Social History  Substance Use Topics  . Smoking status: Former Smoker    Packs/day: 0.25    Years: 1.00    Types: Cigarettes    Quit date: 10/26/1983  . Smokeless tobacco: Never Used  . Alcohol use Yes     Comment: every other day-glass of wine     Allergies   Patient has no known allergies.   Review of Systems Review of Systems  Constitutional: Negative for fever.  Gastrointestinal: Negative for bowel incontinence.  Genitourinary: Negative for bladder incontinence.  Musculoskeletal: Positive for back pain.  Neurological: Negative for weakness and numbness.  All other systems reviewed and are negative.    Physical Exam Updated Vital Signs BP 155/99   Pulse 115  Temp 98.4 F (36.9 C) (Oral)   SpO2 98%   Physical Exam  Constitutional: He is oriented to person, place, and time. He appears well-developed and well-nourished. No distress.  HENT:  Head: Normocephalic and atraumatic.  Nose: Nose normal.  Eyes: Conjunctivae are normal.  Neck: Neck supple. No tracheal deviation present.  Cardiovascular: Normal rate and regular rhythm.   Pulmonary/Chest: Effort normal. No respiratory distress.  Abdominal: Soft. He exhibits no distension.  Musculoskeletal:       Lumbar back: He exhibits tenderness.       Back:  Chronic scoliosis  Neurological: He is alert and oriented to person, place, and time.  Skin: Skin is warm and dry.  Psychiatric: He has a normal mood and affect.      ED Treatments / Results  Labs (all labs ordered are listed, but only abnormal results are displayed) Labs Reviewed - No data to display  EKG  EKG Interpretation None       Radiology No results found.  Procedures Procedures (including critical care time)  Procedure Note: Trigger Point Injection for Myofascial pain  Performed by Dr. Clydene PughKnott Indication: muscle/myofascial pain Muscle body and tendon sheath of the left lumbar paraspinal muscle(s) were injected with 0.5% bupivacaine under sterile technique for release of muscle spasm/pain. Patient tolerated well with immediate improvement of symptoms and no immediate complications following procedure.  CPT Code:   1 or 2 muscle bodies: 20552   Medications Ordered in ED Medications  bupivacaine (MARCAINE) 0.5 % injection 20 mL (20 mLs Infiltration Given by Other 01/05/17 1955)  ketorolac (TORADOL) injection 60 mg (60 mg Intramuscular Given 01/05/17 1955)  acetaminophen (TYLENOL) tablet 1,000 mg (1,000 mg Oral Given 01/05/17 1955)     Initial Impression / Assessment and Plan / ED Course  I have reviewed the triage vital signs and the nursing notes.  Pertinent labs & imaging results that were available during my care of the patient were reviewed by me and considered in my medical decision making (see chart for details).     51 y.o. male presents with left sided low back pain after moving today and feelign a "pop". No neurologic deficits. Well appearing but in acute pain. Local anaesthesia applied. Pt with chronic limb length incongruence and scoliosis likely resulting in undue stress to left low back and now having muscle spasm/pain. Patient was recommended to take short course of scheduled NSAIDs and engage in early mobility as definitive treatment. Plan to follow up with PCP as needed and return precautions discussed for worsening or new concerning symptoms.   Final Clinical Impressions(s) / ED Diagnoses   Final diagnoses:    Acute left-sided low back pain with left-sided sciatica    New Prescriptions Discharge Medication List as of 01/05/2017  8:28 PM    START taking these medications   Details  acetaminophen (TYLENOL) 500 MG tablet Take 2 tablets (1,000 mg total) by mouth every 6 (six) hours as needed for moderate pain., Starting Wed 01/05/2017, Print         Lyndal Pulleyaniel Garvis Downum, MD 01/06/17 [phone]

## 2017-01-05 NOTE — ED Notes (Addendum)
Patient ambulatory in room without difficulty. 

## 2017-01-05 NOTE — ED Triage Notes (Signed)
Per EMS, patient from home, c/o left lower back pain after feeling a "tear" this morning. Reports pain radiates down left leg. 18g L AC. Fentanyl with EMS.

## 2017-01-25 ENCOUNTER — Emergency Department (HOSPITAL_COMMUNITY)
Admission: EM | Admit: 2017-01-25 | Discharge: 2017-01-25 | Disposition: A | Discharge status: Home / Self Care | Payer: Medicaid Other | Attending: Emergency Medicine | Admitting: Emergency Medicine

## 2017-01-25 ENCOUNTER — Emergency Department (HOSPITAL_COMMUNITY): Payer: Medicaid Other

## 2017-01-25 ENCOUNTER — Encounter (HOSPITAL_COMMUNITY): Payer: Self-pay | Admitting: Emergency Medicine

## 2017-01-25 DIAGNOSIS — R109 Unspecified abdominal pain: Secondary | ICD-10-CM | POA: Insufficient documentation

## 2017-01-25 DIAGNOSIS — Z79899 Other long term (current) drug therapy: Secondary | ICD-10-CM | POA: Diagnosis not present

## 2017-01-25 DIAGNOSIS — F909 Attention-deficit hyperactivity disorder, unspecified type: Secondary | ICD-10-CM | POA: Insufficient documentation

## 2017-01-25 DIAGNOSIS — Z87891 Personal history of nicotine dependence: Secondary | ICD-10-CM | POA: Diagnosis not present

## 2017-01-25 DIAGNOSIS — R112 Nausea with vomiting, unspecified: Secondary | ICD-10-CM

## 2017-01-25 DIAGNOSIS — Z7982 Long term (current) use of aspirin: Secondary | ICD-10-CM | POA: Diagnosis not present

## 2017-01-25 HISTORY — DX: Essential (primary) hypertension: I10

## 2017-01-25 LAB — CBC WITH DIFFERENTIAL/PLATELET
BASOS ABS: 0 10*3/uL (ref 0.0–0.1)
BASOS PCT: 0 %
EOS ABS: 0 10*3/uL (ref 0.0–0.7)
EOS PCT: 0 %
HCT: 38.7 % — ABNORMAL LOW (ref 39.0–52.0)
Hemoglobin: 13.5 g/dL (ref 13.0–17.0)
Lymphocytes Relative: 8 %
Lymphs Abs: 0.6 10*3/uL — ABNORMAL LOW (ref 0.7–4.0)
MCH: 35.2 pg — ABNORMAL HIGH (ref 26.0–34.0)
MCHC: 34.9 g/dL (ref 30.0–36.0)
MCV: 100.8 fL — AB (ref 78.0–100.0)
Monocytes Absolute: 0.7 10*3/uL (ref 0.1–1.0)
Monocytes Relative: 9 %
NEUTROS PCT: 83 %
Neutro Abs: 6.5 10*3/uL (ref 1.7–7.7)
PLATELETS: 94 10*3/uL — AB (ref 150–400)
RBC: 3.84 MIL/uL — ABNORMAL LOW (ref 4.22–5.81)
RDW: 12.8 % (ref 11.5–15.5)
WBC: 7.8 10*3/uL (ref 4.0–10.5)

## 2017-01-25 LAB — URINALYSIS, ROUTINE W REFLEX MICROSCOPIC
BILIRUBIN URINE: NEGATIVE
Bacteria, UA: NONE SEEN
Glucose, UA: 150 mg/dL — AB
HGB URINE DIPSTICK: NEGATIVE
Ketones, ur: 80 mg/dL — AB
LEUKOCYTES UA: NEGATIVE
Nitrite: NEGATIVE
PH: 6 (ref 5.0–8.0)
Protein, ur: 100 mg/dL — AB
SPECIFIC GRAVITY, URINE: 1.02 (ref 1.005–1.030)
SQUAMOUS EPITHELIAL / LPF: NONE SEEN

## 2017-01-25 LAB — COMPREHENSIVE METABOLIC PANEL
ALT: 74 U/L — ABNORMAL HIGH (ref 17–63)
AST: 80 U/L — AB (ref 15–41)
Albumin: 4.3 g/dL (ref 3.5–5.0)
Alkaline Phosphatase: 83 U/L (ref 38–126)
Anion gap: 24 — ABNORMAL HIGH (ref 5–15)
BUN: 16 mg/dL (ref 6–20)
CHLORIDE: 92 mmol/L — AB (ref 101–111)
CO2: 18 mmol/L — AB (ref 22–32)
Calcium: 9 mg/dL (ref 8.9–10.3)
Creatinine, Ser: 0.89 mg/dL (ref 0.61–1.24)
GFR calc Af Amer: >60 mL/min (ref >60)
Glucose, Bld: 222 mg/dL — ABNORMAL HIGH (ref 65–99)
POTASSIUM: 3.5 mmol/L (ref 3.5–5.1)
SODIUM: 134 mmol/L — AB (ref 135–145)
Total Bilirubin: 2.1 mg/dL — ABNORMAL HIGH (ref 0.3–1.2)
Total Protein: 8 g/dL (ref 6.5–8.1)

## 2017-01-25 LAB — LIPASE, BLOOD: LIPASE: 34 U/L (ref 11–51)

## 2017-01-25 MED ORDER — IOPAMIDOL (ISOVUE-300) INJECTION 61%: 30.0000 mL | Freq: Once | INTRAVENOUS | Status: DC | PRN

## 2017-01-25 MED ORDER — METOCLOPRAMIDE HCL 5 MG/ML IJ SOLN
10.0000 mg | Freq: Once | INTRAMUSCULAR | Status: AC
  Administered 2017-01-25: 10 mg via INTRAVENOUS
  Filled 2017-01-25: qty 2

## 2017-01-25 MED ORDER — IOPAMIDOL (ISOVUE-300) INJECTION 61%
INTRAVENOUS | Status: AC
  Filled 2017-01-25: qty 100

## 2017-01-25 MED ORDER — METOCLOPRAMIDE HCL 10 MG PO TABS: 10.0000 mg | ORAL_TABLET | Freq: Three times a day (TID) | ORAL | 0 refills | Status: DC | PRN

## 2017-01-25 MED ORDER — SODIUM CHLORIDE 0.9 % IV BOLUS (SEPSIS)
1000.0000 mL | Freq: Once | INTRAVENOUS | Status: AC
  Administered 2017-01-25: 1000 mL via INTRAVENOUS

## 2017-01-25 MED ORDER — IOPAMIDOL (ISOVUE-300) INJECTION 61%
100.0000 mL | Freq: Once | INTRAVENOUS | Status: AC | PRN
  Administered 2017-01-25: 100 mL via INTRAVENOUS

## 2017-01-25 MED ORDER — ONDANSETRON HCL 4 MG/2ML IJ SOLN
4.0000 mg | Freq: Once | INTRAMUSCULAR | Status: AC
  Administered 2017-01-25: 4 mg via INTRAVENOUS

## 2017-01-25 NOTE — ED Notes (Signed)
Patient has bruising on left flank wrapping to left side in band like pattern. Patient denies recent trauma and was unaware of bruising. Will notify PA.

## 2017-01-25 NOTE — ED Provider Notes (Signed)
[facility]-EMERGENCY DEPT Provider Note   CSN: [id number] Arrival date & time: 01/25/17  1041     History   Chief Complaint Chief Complaint  Patient presents with  . Emesis  . Dizziness    HPI Jerron Niblack is a 51 y.o. male.  The history is provided by the patient and medical records.  Emesis   Associated symptoms include abdominal pain.  Dizziness  Associated symptoms: nausea and vomiting     51 year old male with history of arthritis, ADD, depression, presenting to the ED for nausea, vomiting, and abdominal pain.  Reports this has been ongoing for the past 2 days.  Reports he has tried taking over-the-counter Pepto-Bismol and, but continues vomiting up. He denies any fever or chills. No diarrhea. No sick contacts. States she has had limited oral intake so he has been feeling lightheaded. He has not had any syncopal events. Patient does have history of heavy alcohol use, none recently. Denies tremors or seizure activity.  No prior abdominal surgeries. Vital signs stable on arrival.  Past Medical History:  Diagnosis Date  . Arthritis   . Attention deficit disorder   . Depression     Patient Active Problem List   Diagnosis Date Noted  . Hypokalemia 06/16/2016  . Abdominal pain   . 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 cervical fusion with plating x 2  2000  . left knee arthroscopy  1996   x 2  . lower back surgery  2011  . right arm surgery for donor skin graft  2000  . right foot surgery to clean out arthritis  2011  . right heel reconstruction  2000  . right heel surgery  to remove screw  2000  . right heel surgery for fx  2010  . right heel wound I and D for infection   2011       Home Medications    Prior to Admission medications   Medication Sig Start Date End Date Taking? Authorizing Provider  acetaminophen (TYLENOL) 500 MG tablet Take 2 tablets (1,000 mg total) by  mouth every 6 (six) hours as needed for moderate pain. 01/05/17   Lyndal Pulley, MD  aspirin EC 81 MG tablet Take 81 mg by mouth daily.    Historical Provider, MD  cholecalciferol (VITAMIN D) 1000 units tablet Take 1,000 Units by mouth every other day.    Historical Provider, MD  ibuprofen (ADVIL,MOTRIN) 800 MG tablet Take 1 tablet (800 mg total) by mouth every 6 (six) hours as needed for moderate pain. 01/05/17   Lyndal Pulley, MD  lisinopril-hydrochlorothiazide (PRINZIDE,ZESTORETIC) 20-12.5 MG tablet Take 1 tablet by mouth daily.    Historical Provider, MD  POTASSIUM PO Take 1 tablet by mouth daily.    Historical Provider, MD  vitamin C (ASCORBIC ACID) 500 MG tablet Take 500 mg by mouth every other day.    Historical Provider, MD    Family History History reviewed. No pertinent family history.  Social History Social History  Substance Use Topics  . Smoking status: Former Smoker    Packs/day: 0.25    Years: 1.00    Types: Cigarettes    Quit date: 10/26/1983  . Smokeless tobacco: Never Used  . Alcohol use Yes     Comment: every other day-glass of wine     Allergies   Patient has no known allergies.   Review of Systems Review of Systems  Gastrointestinal: Positive  for abdominal pain, nausea and vomiting.  Neurological: Positive for dizziness.  All other systems reviewed and are negative.    Physical Exam Updated Vital Signs BP (!) 165/93 (BP Location: Left Arm)   Pulse (!) 58   Temp 97.6 F (36.4 C) (Oral)   Resp 14   Ht  (1.803 m)   Wt 77.1 kg   SpO2 100%   BMI 23.71 kg/m   Physical Exam  Constitutional: He is oriented to person, place, and time. He appears well-developed and well-nourished.  Uncomfortable, appears tremulous, actively vomiting throughout exam  HENT:  Head: Normocephalic and atraumatic.  Mouth/Throat: Oropharynx is clear and moist.  Eyes: Conjunctivae and EOM are normal. Pupils are equal, round, and reactive to light.  Neck: Normal range of  motion.  Cardiovascular: Normal rate, regular rhythm and normal heart sounds.   Pulmonary/Chest: Effort normal and breath sounds normal. No respiratory distress. He has no wheezes.  Abdominal: Soft. Bowel sounds are normal. There is no tenderness. There is no rebound.  Musculoskeletal: Normal range of motion.  Large amount of bruising of left lumbar region and flank; this is locally tender Appears to have some baseline scoliosis  Neurological: He is alert and oriented to person, place, and time.  No tremors or seizure activity  Skin: Skin is warm and dry.  Psychiatric: He has a normal mood and affect.  Nursing note and vitals reviewed.    ED Treatments / Results  Labs (all labs ordered are listed, but only abnormal results are displayed) Labs Reviewed  CBC WITH DIFFERENTIAL/PLATELET - Abnormal; Notable for the following:       Result Value   RBC 3.84 (*)    HCT 38.7 (*)    MCV 100.8 (*)    MCH 35.2 (*)    Platelets 94 (*)    Lymphs Abs 0.6 (*)    All other components within normal limits  COMPREHENSIVE METABOLIC PANEL - Abnormal; Notable for the following:    Sodium 134 (*)    Chloride 92 (*)    CO2 18 (*)    Glucose, Bld 222 (*)    AST 80 (*)    ALT 74 (*)    Total Bilirubin 2.1 (*)    Anion gap 24 (*)    All other components within normal limits  URINALYSIS, ROUTINE W REFLEX MICROSCOPIC - Abnormal; Notable for the following:    Glucose, UA 150 (*)    Ketones, ur 80 (*)    Protein, ur 100 (*)    All other components within normal limits  LIPASE, BLOOD    EKG  EKG Interpretation None       Radiology Ct Abdomen Pelvis W Contrast  Result Date: 01/25/2017 CLINICAL DATA:  Nausea, vomiting, back and abdominal pain since 01/23/2017. EXAM: CT ABDOMEN AND PELVIS WITH CONTRAST TECHNIQUE: Multidetector CT imaging of the abdomen and pelvis was performed using the standard protocol following bolus administration of intravenous contrast. CONTRAST:  100 ml ISOVUE-300  IOPAMIDOL (ISOVUE-300) INJECTION 61% COMPARISON:  CT abdomen and pelvis 06/16/2016. FINDINGS: Lower chest: Heart size is normal. No pleural or pericardial effusion. Lung bases are clear. Hepatobiliary: No focal lesion. No focal liver lesion. The liver is markedly low attenuating consistent with fatty infiltration. The gallbladder and biliary tree appear normal. Pancreas: Unremarkable. No pancreatic ductal dilatation or surrounding inflammatory changes. Spleen: Normal in size without focal abnormality. Adrenals/Urinary Tract: Adrenal glands are unremarkable. Kidneys are normal, without renal calculi, focal lesion, or hydronephrosis. Bladder is unremarkable.  Stomach/Bowel: The stomach, small bowel and appendix appear normal. There is some fatty infiltration of the walls of the ascending and proximal transverse colon, likely incidental. The colon otherwise appears normal. Vascular/Lymphatic: Age advanced aortoiliac atherosclerosis without aneurysm is seen. Reproductive: Prostate is unremarkable. Other: No fluid collection or hernia. Musculoskeletal: No fracture or worrisome lesion. A few Schmorl's nodes are unchanged. IMPRESSION: No acute abnormality abdomen or pelvis. Marked fatty infiltration of the liver. Age advanced atherosclerosis. Electronically Signed   By: Drusilla Kanner M.D.   On: 01/25/2017 13:31    Procedures Procedures (including critical care time)  Medications Ordered in ED Medications  iopamidol (ISOVUE-300) 61 % injection (not administered)  sodium chloride 0.9 % bolus 1,000 mL (0 mLs Intravenous Stopped 01/25/17 1230)  ondansetron (ZOFRAN) injection 4 mg (4 mg Intravenous Given 01/25/17 1155)  iopamidol (ISOVUE-300) 61 % injection 100 mL (100 mLs Intravenous Contrast Given 01/25/17 1302)  sodium chloride 0.9 % bolus 1,000 mL (0 mLs Intravenous Stopped 01/25/17 1511)  metoCLOPramide (REGLAN) injection 10 mg (10 mg Intravenous Given 01/25/17 1411)     Initial Impression / Assessment and Plan /  ED Course  I have reviewed the triage vital signs and the nursing notes.  Pertinent labs & imaging results that were available during my care of the patient were reviewed by me and considered in my medical decision making (see chart for details).  51 year old male here with nausea and vomiting. Has been ongoing for 2-1/2 days now. He is afebrile and nontoxic. He is actively vomiting during exam. As he was leaning forward I did notice a large amount of bruising to his left lower back and flank. He denies any noted trauma or falls but has had some soreness in this area. Denies any numbness or weakness of his extremities. No bowel or bladder incontinence. We'll plan for labs, CT scan given this bruising of unknown etiology.  Patient given IVF, antiemetics.   Labwork is overall reassuring. Bicarb is mildly low at 18 with increased anion gap of 24 which I suspect is secondary to his vomiting.  Urine with large ketones consistent with some dehydration.  CT scan without acute findings.  Patient given 2L IVF.  Vomiting has been controlled here.  Patient tolerating oral sprite without issue.  States he feels better and wants to go home.  Feel this is reasonable.  Will d/c home with reglan as he responded well to this here.  Will have him continue oral fluids at home, follow-up closely with PCP.  Discussed plan with patient, he acknowledged understanding and agreed with plan of care.  Return precautions given for new or worsening symptoms.  Final Clinical Impressions(s) / ED Diagnoses   Final diagnoses:  Nausea and vomiting, intractability of vomiting not specified, unspecified vomiting type    New Prescriptions New Prescriptions   No medications on file     Garlon Hatchet, Cordelia Poche 01/25/17 1536    Tilden Fossa, MD 02/03/17 [phone]

## 2017-01-25 NOTE — ED Notes (Signed)
Pt drank sprite

## 2017-01-25 NOTE — Discharge Instructions (Signed)
Take the prescribed medication as directed.  Continue oral fluids to stay hydrated. Follow-up with your primary care doctor. Return to the ED for new or worsening symptoms.

## 2017-01-25 NOTE — ED Notes (Signed)
Pt discharged home. Reports he has belongings and family will pick up from lobby.

## 2017-01-25 NOTE — ED Notes (Signed)
Patient transported to CT 

## 2017-01-25 NOTE — ED Triage Notes (Signed)
Pt reports emesis and generalized abd pain for the past 2.5 days. No diarrhea.  Now feels lightheaded. Has not been able to keep down much PO fluids.

## 2017-07-28 ENCOUNTER — Encounter: Payer: Self-pay | Admitting: Family Medicine

## 2017-07-29 ENCOUNTER — Ambulatory Visit: Payer: Self-pay | Admitting: Physician Assistant

## 2017-08-10 ENCOUNTER — Emergency Department (HOSPITAL_COMMUNITY)
Admission: EM | Admit: 2017-08-10 | Discharge: 2017-08-10 | Disposition: A | Discharge status: Home / Self Care | Payer: Medicaid Other | Attending: Emergency Medicine | Admitting: Emergency Medicine

## 2017-08-10 ENCOUNTER — Encounter (HOSPITAL_COMMUNITY): Payer: Self-pay | Admitting: Emergency Medicine

## 2017-08-10 ENCOUNTER — Emergency Department (HOSPITAL_COMMUNITY): Payer: Medicaid Other

## 2017-08-10 DIAGNOSIS — Y939 Activity, unspecified: Secondary | ICD-10-CM | POA: Insufficient documentation

## 2017-08-10 DIAGNOSIS — Z7982 Long term (current) use of aspirin: Secondary | ICD-10-CM | POA: Insufficient documentation

## 2017-08-10 DIAGNOSIS — Z79899 Other long term (current) drug therapy: Secondary | ICD-10-CM | POA: Insufficient documentation

## 2017-08-10 DIAGNOSIS — R29898 Other symptoms and signs involving the musculoskeletal system: Secondary | ICD-10-CM

## 2017-08-10 DIAGNOSIS — Y92010 Kitchen of single-family (private) house as the place of occurrence of the external cause: Secondary | ICD-10-CM | POA: Insufficient documentation

## 2017-08-10 DIAGNOSIS — S39012A Strain of muscle, fascia and tendon of lower back, initial encounter: Secondary | ICD-10-CM

## 2017-08-10 DIAGNOSIS — S2232XA Fracture of one rib, left side, initial encounter for closed fracture: Secondary | ICD-10-CM | POA: Diagnosis not present

## 2017-08-10 DIAGNOSIS — Z87891 Personal history of nicotine dependence: Secondary | ICD-10-CM | POA: Insufficient documentation

## 2017-08-10 DIAGNOSIS — I10 Essential (primary) hypertension: Secondary | ICD-10-CM | POA: Diagnosis not present

## 2017-08-10 DIAGNOSIS — R52 Pain, unspecified: Secondary | ICD-10-CM

## 2017-08-10 DIAGNOSIS — Y92009 Unspecified place in unspecified non-institutional (private) residence as the place of occurrence of the external cause: Secondary | ICD-10-CM

## 2017-08-10 DIAGNOSIS — W010XXA Fall on same level from slipping, tripping and stumbling without subsequent striking against object, initial encounter: Secondary | ICD-10-CM | POA: Diagnosis not present

## 2017-08-10 DIAGNOSIS — Y999 Unspecified external cause status: Secondary | ICD-10-CM | POA: Diagnosis not present

## 2017-08-10 DIAGNOSIS — W19XXXA Unspecified fall, initial encounter: Secondary | ICD-10-CM

## 2017-08-10 MED ORDER — OXYCODONE HCL 5 MG PO TABS: 5.0000 mg | ORAL_TABLET | Freq: Once | ORAL | Status: DC

## 2017-08-10 MED ORDER — OXYCODONE-ACETAMINOPHEN 5-325 MG PO TABS
1.0000 | ORAL_TABLET | ORAL | Status: AC | PRN
  Administered 2017-08-10 (×2): 1 via ORAL
  Filled 2017-08-10 (×2): qty 1

## 2017-08-10 MED ORDER — OXYCODONE HCL 5 MG PO TABS: 2.5000 mg | ORAL_TABLET | Freq: Four times a day (QID) | ORAL | 0 refills | Status: DC | PRN

## 2017-08-10 MED ORDER — LIDOCAINE 5 % EX PTCH
1.0000 | MEDICATED_PATCH | CUTANEOUS | Status: DC
  Administered 2017-08-10: 1 via TRANSDERMAL
  Filled 2017-08-10: qty 1
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
## 2017-08-10 NOTE — ED Triage Notes (Signed)
Pt states he fell on Sunday after tripping over a throw rug  Pt states he twisted and landed on his back and left side  Pt is c/o lower back pain and left rib pain

## 2017-08-10 NOTE — Discharge Instructions (Signed)
Take oxycodone for breakthrough pain, do not drink alcohol, drive, care for children or do other critical tasks while taking oxycodone. ° °It is very important that you take deep breaths to prevent lung collapse and infection. ° °Either use your incentive spirometer or take 10 deep breaths every hour to prevent lung collapse. ° °If you develop cough, fever or shortness of breath return immediately to the emergency room.  ° ° ° °

## 2017-08-24 ENCOUNTER — Encounter (HOSPITAL_COMMUNITY): Admission: RE | Payer: Self-pay | Source: Ambulatory Visit

## 2017-08-24 ENCOUNTER — Ambulatory Visit (HOSPITAL_COMMUNITY): Admission: RE | Admit: 2017-08-24 | Payer: Medicaid Other | Source: Ambulatory Visit | Admitting: Orthopedic Surgery

## 2017-08-24 SURGERY — LUMBAR LAMINECTOMY/DECOMPRESSION MICRODISCECTOMY 1 LEVEL
Anesthesia: General | Laterality: Left

## 2017-11-15 ENCOUNTER — Inpatient Hospital Stay (HOSPITAL_COMMUNITY): Payer: Medicaid Other

## 2017-11-15 ENCOUNTER — Inpatient Hospital Stay (HOSPITAL_COMMUNITY)
Admission: AD | Admit: 2017-11-15 | Discharge: 2017-11-17 | DRG: 520 | Disposition: A | Discharge status: Home / Self Care | Payer: Medicaid Other | Source: Ambulatory Visit | Attending: Orthopedic Surgery | Admitting: Orthopedic Surgery

## 2017-11-15 ENCOUNTER — Encounter (HOSPITAL_COMMUNITY): Payer: Self-pay | Admitting: Physician Assistant

## 2017-11-15 ENCOUNTER — Other Ambulatory Visit (HOSPITAL_COMMUNITY): Payer: Self-pay | Admitting: Physician Assistant

## 2017-11-15 DIAGNOSIS — M21379 Foot drop, unspecified foot: Secondary | ICD-10-CM | POA: Diagnosis present

## 2017-11-15 DIAGNOSIS — M79605 Pain in left leg: Secondary | ICD-10-CM | POA: Diagnosis present

## 2017-11-15 DIAGNOSIS — M5116 Intervertebral disc disorders with radiculopathy, lumbar region: Secondary | ICD-10-CM | POA: Diagnosis present

## 2017-11-15 DIAGNOSIS — F329 Major depressive disorder, single episode, unspecified: Secondary | ICD-10-CM | POA: Diagnosis present

## 2017-11-15 DIAGNOSIS — Z87891 Personal history of nicotine dependence: Secondary | ICD-10-CM | POA: Diagnosis not present

## 2017-11-15 DIAGNOSIS — F988 Other specified behavioral and emotional disorders with onset usually occurring in childhood and adolescence: Secondary | ICD-10-CM | POA: Diagnosis present

## 2017-11-15 DIAGNOSIS — Z01811 Encounter for preprocedural respiratory examination: Secondary | ICD-10-CM

## 2017-11-15 DIAGNOSIS — M5126 Other intervertebral disc displacement, lumbar region: Secondary | ICD-10-CM | POA: Diagnosis present

## 2017-11-15 DIAGNOSIS — I10 Essential (primary) hypertension: Secondary | ICD-10-CM | POA: Diagnosis present

## 2017-11-15 DIAGNOSIS — Z419 Encounter for procedure for purposes other than remedying health state, unspecified: Secondary | ICD-10-CM

## 2017-11-15 DIAGNOSIS — M1990 Unspecified osteoarthritis, unspecified site: Secondary | ICD-10-CM | POA: Diagnosis present

## 2017-11-15 DIAGNOSIS — Z7982 Long term (current) use of aspirin: Secondary | ICD-10-CM

## 2017-11-15 DIAGNOSIS — Z9889 Other specified postprocedural states: Secondary | ICD-10-CM

## 2017-11-15 LAB — BASIC METABOLIC PANEL
Anion gap: 11 (ref 5–15)
BUN: 8 mg/dL (ref 6–20)
CHLORIDE: 107 mmol/L (ref 101–111)
CO2: 21 mmol/L — AB (ref 22–32)
Calcium: 9.4 mg/dL (ref 8.9–10.3)
Creatinine, Ser: 1.11 mg/dL (ref 0.61–1.24)
GFR calc non Af Amer: >60 mL/min (ref >60)
Glucose, Bld: 87 mg/dL (ref 65–99)
POTASSIUM: 4.2 mmol/L (ref 3.5–5.1)
SODIUM: 139 mmol/L (ref 135–145)

## 2017-11-15 LAB — CBC
HEMATOCRIT: 37.4 % — AB (ref 39.0–52.0)
Hemoglobin: 12.8 g/dL — ABNORMAL LOW (ref 13.0–17.0)
MCH: 34.9 pg — AB (ref 26.0–34.0)
MCHC: 34.2 g/dL (ref 30.0–36.0)
MCV: 101.9 fL — AB (ref 78.0–100.0)
Platelets: 152 10*3/uL (ref 150–400)
RBC: 3.67 MIL/uL — AB (ref 4.22–5.81)
RDW: 12.4 % (ref 11.5–15.5)
WBC: 7.4 10*3/uL (ref 4.0–10.5)

## 2017-11-15 LAB — PROTIME-INR
INR: 0.94
Prothrombin Time: 12.5 seconds (ref 11.4–15.2)

## 2017-11-15 MED ORDER — HYDROCHLOROTHIAZIDE 12.5 MG PO CAPS
12.5000 mg | ORAL_CAPSULE | Freq: Every day | ORAL | Status: DC
  Administered 2017-11-16 – 2017-11-17 (×2): 12.5 mg via ORAL
  Filled 2017-11-15 (×2): qty 1

## 2017-11-15 MED ORDER — CEFAZOLIN SODIUM-DEXTROSE 2-4 GM/100ML-% IV SOLN: 2.0000 g | Freq: Once | INTRAVENOUS | Status: DC

## 2017-11-15 MED ORDER — LISINOPRIL 20 MG PO TABS
20.0000 mg | ORAL_TABLET | Freq: Every day | ORAL | Status: DC
  Administered 2017-11-16 – 2017-11-17 (×2): 20 mg via ORAL
  Filled 2017-11-15 (×2): qty 1

## 2017-11-15 MED ORDER — CEFAZOLIN SODIUM-DEXTROSE 2-4 GM/100ML-% IV SOLN
2.0000 g | INTRAVENOUS | Status: AC
  Administered 2017-11-16: 2 g via INTRAVENOUS
  Filled 2017-11-15: qty 100

## 2017-11-15 MED ORDER — SERTRALINE HCL 25 MG PO TABS
25.0000 mg | ORAL_TABLET | Freq: Every day | ORAL | Status: DC
  Administered 2017-11-16 – 2017-11-17 (×2): 25 mg via ORAL
  Filled 2017-11-15 (×2): qty 1

## 2017-11-15 MED ORDER — MORPHINE SULFATE (PF) 4 MG/ML IV SOLN
2.0000 mg | INTRAVENOUS | Status: DC | PRN
  Administered 2017-11-15 – 2017-11-17 (×10): 2 mg via INTRAVENOUS
  Filled 2017-11-15 (×10): qty 1

## 2017-11-15 MED ORDER — METHOCARBAMOL 500 MG PO TABS
500.0000 mg | ORAL_TABLET | Freq: Three times a day (TID) | ORAL | Status: DC | PRN
  Administered 2017-11-15: 500 mg via ORAL
  Filled 2017-11-15: qty 1

## 2017-11-15 MED ORDER — CEFAZOLIN (ANCEF) 1 G IV SOLR: 1.0000 g | INTRAVENOUS | Status: DC

## 2017-11-15 MED ORDER — LISINOPRIL-HYDROCHLOROTHIAZIDE 20-12.5 MG PO TABS: 1.0000 | ORAL_TABLET | Freq: Every day | ORAL | Status: DC

## 2017-11-15 NOTE — Progress Notes (Signed)
Patient admitted directly from doctor's office for lumbar discectomy on 1/23. Patient alert and oriented x 4, standby assist, and shown how to use call bell and phone. IV started in left forearm. Having progressive lower back pain, morphine and robaxin given per order. Will continue to monitor.
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
## 2017-11-16 ENCOUNTER — Other Ambulatory Visit: Payer: Self-pay

## 2017-11-16 ENCOUNTER — Encounter (HOSPITAL_COMMUNITY): Payer: Self-pay

## 2017-11-16 ENCOUNTER — Inpatient Hospital Stay (HOSPITAL_COMMUNITY): Payer: Medicaid Other

## 2017-11-16 ENCOUNTER — Inpatient Hospital Stay (HOSPITAL_COMMUNITY)
Admission: AD | Disposition: A | Discharge status: Home / Self Care | Payer: Self-pay | Source: Ambulatory Visit | Attending: Orthopedic Surgery

## 2017-11-16 ENCOUNTER — Inpatient Hospital Stay (HOSPITAL_COMMUNITY): Payer: Medicaid Other | Admitting: Anesthesiology

## 2017-11-16 DIAGNOSIS — Z9889 Other specified postprocedural states: Secondary | ICD-10-CM

## 2017-11-16 HISTORY — PX: LUMBAR LAMINECTOMY/DECOMPRESSION MICRODISCECTOMY: SHX5026

## 2017-11-16 LAB — SURGICAL PCR SCREEN
MRSA, PCR: NEGATIVE
Staphylococcus aureus: NEGATIVE

## 2017-11-16 SURGERY — LUMBAR LAMINECTOMY/DECOMPRESSION MICRODISCECTOMY
Anesthesia: General | Laterality: Left

## 2017-11-16 MED ORDER — DIPHENHYDRAMINE HCL 50 MG/ML IJ SOLN
12.5000 mg | Freq: Once | INTRAMUSCULAR | Status: AC
  Administered 2017-11-16: 12.5 mg via INTRAVENOUS
  Filled 2017-11-16: qty 1

## 2017-11-16 MED ORDER — DOCUSATE SODIUM 100 MG PO CAPS
100.0000 mg | ORAL_CAPSULE | Freq: Two times a day (BID) | ORAL | Status: DC
  Filled 2017-11-16 (×2): qty 1

## 2017-11-16 MED ORDER — MIDAZOLAM HCL 2 MG/2ML IJ SOLN
INTRAMUSCULAR | Status: AC
  Filled 2017-11-16: qty 2

## 2017-11-16 MED ORDER — FENTANYL CITRATE (PF) 250 MCG/5ML IJ SOLN
INTRAMUSCULAR | Status: AC
  Filled 2017-11-16: qty 5

## 2017-11-16 MED ORDER — METHOCARBAMOL 500 MG PO TABS: 500.0000 mg | ORAL_TABLET | Freq: Three times a day (TID) | ORAL | 0 refills | Status: DC

## 2017-11-16 MED ORDER — ACETAMINOPHEN 325 MG PO TABS
650.0000 mg | ORAL_TABLET | ORAL | Status: DC | PRN
Start: 2017-11-16 — End: 2017-11-17

## 2017-11-16 MED ORDER — OXYCODONE HCL 5 MG PO TABS
10.0000 mg | ORAL_TABLET | ORAL | Status: DC | PRN
  Administered 2017-11-17 (×4): 10 mg via ORAL
  Filled 2017-11-16 (×4): qty 2

## 2017-11-16 MED ORDER — SODIUM CHLORIDE 0.9 % IV SOLN: 250.0000 mL | INTRAVENOUS | Status: DC

## 2017-11-16 MED ORDER — PHENYLEPHRINE 40 MCG/ML (10ML) SYRINGE FOR IV PUSH (FOR BLOOD PRESSURE SUPPORT)
PREFILLED_SYRINGE | INTRAVENOUS | Status: DC | PRN
  Administered 2017-11-16 (×2): 200 ug via INTRAVENOUS

## 2017-11-16 MED ORDER — SUCCINYLCHOLINE CHLORIDE 200 MG/10ML IV SOSY
PREFILLED_SYRINGE | INTRAVENOUS | Status: AC
  Filled 2017-11-16: qty 10

## 2017-11-16 MED ORDER — LIDOCAINE 2% (20 MG/ML) 5 ML SYRINGE
INTRAMUSCULAR | Status: AC
  Filled 2017-11-16: qty 5

## 2017-11-16 MED ORDER — METHOCARBAMOL 500 MG PO TABS
500.0000 mg | ORAL_TABLET | Freq: Four times a day (QID) | ORAL | Status: DC | PRN
Start: 2017-11-16 — End: 2017-11-17
  Administered 2017-11-16: 500 mg via ORAL

## 2017-11-16 MED ORDER — ROCURONIUM BROMIDE 10 MG/ML (PF) SYRINGE
PREFILLED_SYRINGE | INTRAVENOUS | Status: DC | PRN
  Administered 2017-11-16: 50 mg via INTRAVENOUS

## 2017-11-16 MED ORDER — PHENYLEPHRINE 40 MCG/ML (10ML) SYRINGE FOR IV PUSH (FOR BLOOD PRESSURE SUPPORT)
PREFILLED_SYRINGE | INTRAVENOUS | Status: AC
  Filled 2017-11-16: qty 10

## 2017-11-16 MED ORDER — FENTANYL CITRATE (PF) 100 MCG/2ML IJ SOLN
INTRAMUSCULAR | Status: AC
  Filled 2017-11-16: qty 2

## 2017-11-16 MED ORDER — OXYCODONE-ACETAMINOPHEN 10-325 MG PO TABS: 1.0000 | ORAL_TABLET | ORAL | 0 refills | Status: AC | PRN

## 2017-11-16 MED ORDER — PROPOFOL 10 MG/ML IV BOLUS
INTRAVENOUS | Status: AC
  Filled 2017-11-16: qty 20

## 2017-11-16 MED ORDER — DEXAMETHASONE SODIUM PHOSPHATE 10 MG/ML IJ SOLN
INTRAMUSCULAR | Status: DC | PRN
  Administered 2017-11-16: 10 mg via INTRAVENOUS

## 2017-11-16 MED ORDER — THROMBIN (RECOMBINANT) 20000 UNITS EX SOLR
CUTANEOUS | Status: DC | PRN
  Administered 2017-11-16: 20000 [IU] via TOPICAL

## 2017-11-16 MED ORDER — MEPERIDINE HCL 25 MG/ML IJ SOLN: 6.2500 mg | INTRAMUSCULAR | Status: DC | PRN

## 2017-11-16 MED ORDER — SODIUM CHLORIDE 0.9% FLUSH
3.0000 mL | Freq: Two times a day (BID) | INTRAVENOUS | Status: DC
  Administered 2017-11-16: 3 mL via INTRAVENOUS

## 2017-11-16 MED ORDER — ACETAMINOPHEN 325 MG PO TABS: 325.0000 mg | ORAL_TABLET | ORAL | Status: DC | PRN

## 2017-11-16 MED ORDER — ACETAMINOPHEN 160 MG/5ML PO SOLN: 325.0000 mg | ORAL | Status: DC | PRN

## 2017-11-16 MED ORDER — OXYCODONE HCL 5 MG/5ML PO SOLN: 5.0000 mg | Freq: Once | ORAL | Status: AC | PRN

## 2017-11-16 MED ORDER — METHOCARBAMOL 500 MG PO TABS
ORAL_TABLET | ORAL | Status: AC
  Filled 2017-11-16: qty 1

## 2017-11-16 MED ORDER — THROMBIN 20000 UNITS EX SOLR
CUTANEOUS | Status: AC
  Filled 2017-11-16: qty 20000

## 2017-11-16 MED ORDER — EPHEDRINE SULFATE-NACL 50-0.9 MG/10ML-% IV SOSY
PREFILLED_SYRINGE | INTRAVENOUS | Status: DC | PRN
  Administered 2017-11-16: 10 mg via INTRAVENOUS
  Administered 2017-11-16: 15 mg via INTRAVENOUS
  Administered 2017-11-16: 10 mg via INTRAVENOUS

## 2017-11-16 MED ORDER — ROCURONIUM BROMIDE 10 MG/ML (PF) SYRINGE
PREFILLED_SYRINGE | INTRAVENOUS | Status: AC
  Filled 2017-11-16: qty 5

## 2017-11-16 MED ORDER — PHENOL 1.4 % MT LIQD: 1.0000 | OROMUCOSAL | Status: DC | PRN

## 2017-11-16 MED ORDER — 0.9 % SODIUM CHLORIDE (POUR BTL) OPTIME
TOPICAL | Status: DC | PRN
  Administered 2017-11-16: 1000 mL

## 2017-11-16 MED ORDER — OXYCODONE HCL 5 MG PO TABS
ORAL_TABLET | ORAL | Status: AC
  Filled 2017-11-16: qty 1

## 2017-11-16 MED ORDER — SUGAMMADEX SODIUM 200 MG/2ML IV SOLN
INTRAVENOUS | Status: DC | PRN
  Administered 2017-11-16: 200 mg via INTRAVENOUS

## 2017-11-16 MED ORDER — ACETAMINOPHEN 650 MG RE SUPP: 650.0000 mg | RECTAL | Status: DC | PRN

## 2017-11-16 MED ORDER — CEFAZOLIN SODIUM-DEXTROSE 2-4 GM/100ML-% IV SOLN
2.0000 g | Freq: Three times a day (TID) | INTRAVENOUS | Status: AC
  Administered 2017-11-17 (×2): 2 g via INTRAVENOUS
  Filled 2017-11-16 (×2): qty 100

## 2017-11-16 MED ORDER — ONDANSETRON 4 MG PO TBDP: 4.0000 mg | ORAL_TABLET | Freq: Three times a day (TID) | ORAL | 0 refills | Status: DC | PRN

## 2017-11-16 MED ORDER — METHOCARBAMOL 1000 MG/10ML IJ SOLN
500.0000 mg | Freq: Four times a day (QID) | INTRAVENOUS | Status: DC | PRN
  Filled 2017-11-16: qty 5

## 2017-11-16 MED ORDER — METHYLPREDNISOLONE ACETATE 40 MG/ML IJ SUSP
INTRAMUSCULAR | Status: AC
  Filled 2017-11-16: qty 1

## 2017-11-16 MED ORDER — LIDOCAINE 2% (20 MG/ML) 5 ML SYRINGE
INTRAMUSCULAR | Status: DC | PRN
  Administered 2017-11-16: 100 mg via INTRAVENOUS

## 2017-11-16 MED ORDER — ONDANSETRON HCL 4 MG/2ML IJ SOLN: 4.0000 mg | Freq: Four times a day (QID) | INTRAMUSCULAR | Status: DC | PRN

## 2017-11-16 MED ORDER — LACTATED RINGERS IV SOLN
INTRAVENOUS | Status: DC
  Administered 2017-11-16 (×2): via INTRAVENOUS

## 2017-11-16 MED ORDER — MIDAZOLAM HCL 2 MG/2ML IJ SOLN
INTRAMUSCULAR | Status: DC | PRN
  Administered 2017-11-16: 2 mg via INTRAVENOUS

## 2017-11-16 MED ORDER — FENTANYL CITRATE (PF) 100 MCG/2ML IJ SOLN
INTRAMUSCULAR | Status: DC | PRN
  Administered 2017-11-16: 100 ug via INTRAVENOUS

## 2017-11-16 MED ORDER — HEMOSTATIC AGENTS (NO CHARGE) OPTIME
TOPICAL | Status: DC | PRN
  Administered 2017-11-16: 1 via TOPICAL

## 2017-11-16 MED ORDER — ONDANSETRON HCL 4 MG/2ML IJ SOLN: 4.0000 mg | Freq: Once | INTRAMUSCULAR | Status: DC | PRN

## 2017-11-16 MED ORDER — POLYETHYLENE GLYCOL 3350 17 G PO PACK: 17.0000 g | PACK | Freq: Every day | ORAL | Status: DC | PRN

## 2017-11-16 MED ORDER — FENTANYL CITRATE (PF) 100 MCG/2ML IJ SOLN
25.0000 ug | INTRAMUSCULAR | Status: DC | PRN
  Administered 2017-11-16 (×3): 50 ug via INTRAVENOUS

## 2017-11-16 MED ORDER — SODIUM CHLORIDE 0.9% FLUSH: 3.0000 mL | INTRAVENOUS | Status: DC | PRN

## 2017-11-16 MED ORDER — ONDANSETRON HCL 4 MG PO TABS: 4.0000 mg | ORAL_TABLET | Freq: Four times a day (QID) | ORAL | Status: DC | PRN

## 2017-11-16 MED ORDER — BUPIVACAINE-EPINEPHRINE 0.5% -1:200000 IJ SOLN
INTRAMUSCULAR | Status: DC | PRN
  Administered 2017-11-16: 10 mL

## 2017-11-16 MED ORDER — MENTHOL 3 MG MT LOZG: 1.0000 | LOZENGE | OROMUCOSAL | Status: DC | PRN

## 2017-11-16 MED ORDER — LACTATED RINGERS IV SOLN
INTRAVENOUS | Status: DC
  Administered 2017-11-16: 14:00:00 via INTRAVENOUS

## 2017-11-16 MED ORDER — BUPIVACAINE-EPINEPHRINE (PF) 0.5% -1:200000 IJ SOLN
INTRAMUSCULAR | Status: AC
  Filled 2017-11-16: qty 30

## 2017-11-16 MED ORDER — DEXAMETHASONE SODIUM PHOSPHATE 10 MG/ML IJ SOLN
INTRAMUSCULAR | Status: AC
  Filled 2017-11-16: qty 1

## 2017-11-16 MED ORDER — METHYLPREDNISOLONE ACETATE 40 MG/ML IJ SUSP
INTRAMUSCULAR | Status: DC | PRN
  Administered 2017-11-16: 40 mg

## 2017-11-16 MED ORDER — MAGNESIUM CITRATE PO SOLN: 1.0000 | Freq: Once | ORAL | Status: DC | PRN

## 2017-11-16 MED ORDER — ONDANSETRON HCL 4 MG/2ML IJ SOLN
INTRAMUSCULAR | Status: DC | PRN
  Administered 2017-11-16: 4 mg via INTRAVENOUS

## 2017-11-16 MED ORDER — EPHEDRINE 5 MG/ML INJ
INTRAVENOUS | Status: AC
  Filled 2017-11-16: qty 10

## 2017-11-16 MED ORDER — PROPOFOL 10 MG/ML IV BOLUS
INTRAVENOUS | Status: DC | PRN
  Administered 2017-11-16: 130 mg via INTRAVENOUS

## 2017-11-16 MED ORDER — KETOROLAC TROMETHAMINE 30 MG/ML IJ SOLN: 30.0000 mg | Freq: Once | INTRAMUSCULAR | Status: DC | PRN

## 2017-11-16 MED ORDER — ONDANSETRON HCL 4 MG/2ML IJ SOLN
INTRAMUSCULAR | Status: AC
  Filled 2017-11-16: qty 2

## 2017-11-16 MED ORDER — OXYCODONE HCL 5 MG PO TABS
5.0000 mg | ORAL_TABLET | Freq: Once | ORAL | Status: AC | PRN
  Administered 2017-11-16: 5 mg via ORAL

## 2017-11-16 MED ORDER — MORPHINE SULFATE (PF) 2 MG/ML IV SOLN
1.0000 mg | INTRAVENOUS | Status: DC | PRN
  Administered 2017-11-16 – 2017-11-17 (×2): 1 mg via INTRAVENOUS
  Filled 2017-11-16 (×2): qty 1

## 2017-11-16 SURGICAL SUPPLY — 53 items
BNDG GAUZE ELAST 4 BULKY (GAUZE/BANDAGES/DRESSINGS) ×3 IMPLANT
CANISTER SUCT 3000ML PPV (MISCELLANEOUS) ×3 IMPLANT
CLOSURE STERI-STRIP 1/2X4 (GAUZE/BANDAGES/DRESSINGS) ×1
CLSR STERI-STRIP ANTIMIC 1/2X4 (GAUZE/BANDAGES/DRESSINGS) ×2 IMPLANT
COVER SURGICAL LIGHT HANDLE (MISCELLANEOUS) ×3 IMPLANT
DRAPE SURG 17X23 STRL (DRAPES) ×9 IMPLANT
DRAPE U-SHAPE 47X51 STRL (DRAPES) ×3 IMPLANT
DRSG AQUACEL AG ADV 3.5X 6 (GAUZE/BANDAGES/DRESSINGS) IMPLANT
DRSG OPSITE POSTOP 3X4 (GAUZE/BANDAGES/DRESSINGS) ×3 IMPLANT
DURAPREP 26ML APPLICATOR (WOUND CARE) ×3 IMPLANT
ELECT BLADE 4.0 EZ CLEAN MEGAD (MISCELLANEOUS) ×3
ELECT PENCIL ROCKER SW 15FT (MISCELLANEOUS) ×3 IMPLANT
ELECT REM PT RETURN 9FT ADLT (ELECTROSURGICAL) ×3
ELECTRODE BLDE 4.0 EZ CLN MEGD (MISCELLANEOUS) ×1 IMPLANT
ELECTRODE REM PT RTRN 9FT ADLT (ELECTROSURGICAL) ×1 IMPLANT
FLOSEAL (HEMOSTASIS) ×3 IMPLANT
GLOVE BIO SURGEON STRL SZ 6.5 (GLOVE) ×2 IMPLANT
GLOVE BIO SURGEONS STRL SZ 6.5 (GLOVE) ×1
GLOVE BIOGEL PI IND STRL 6.5 (GLOVE) ×2 IMPLANT
GLOVE BIOGEL PI IND STRL 8.5 (GLOVE) ×1 IMPLANT
GLOVE BIOGEL PI INDICATOR 6.5 (GLOVE) ×4
GLOVE BIOGEL PI INDICATOR 8.5 (GLOVE) ×2
GLOVE SS BIOGEL STRL SZ 8.5 (GLOVE) ×1 IMPLANT
GLOVE SUPERSENSE BIOGEL SZ 8.5 (GLOVE) ×2
GLOVE SURG SS PI 6.5 STRL IVOR (GLOVE) ×3 IMPLANT
GOWN STRL REUS W/ TWL LRG LVL3 (GOWN DISPOSABLE) ×1 IMPLANT
GOWN STRL REUS W/ TWL XL LVL3 (GOWN DISPOSABLE) ×2 IMPLANT
GOWN STRL REUS W/TWL 2XL LVL3 (GOWN DISPOSABLE) ×3 IMPLANT
GOWN STRL REUS W/TWL LRG LVL3 (GOWN DISPOSABLE) ×2
GOWN STRL REUS W/TWL XL LVL3 (GOWN DISPOSABLE) ×4
KIT BASIN OR (CUSTOM PROCEDURE TRAY) ×3 IMPLANT
KIT ROOM TURNOVER OR (KITS) ×3 IMPLANT
NEEDLE 22X1 1/2 (OR ONLY) (NEEDLE) ×3 IMPLANT
NEEDLE SPNL 18GX3.5 QUINCKE PK (NEEDLE) ×6 IMPLANT
NS IRRIG 1000ML POUR BTL (IV SOLUTION) ×3 IMPLANT
PACK LAMINECTOMY ORTHO (CUSTOM PROCEDURE TRAY) ×3 IMPLANT
PACK UNIVERSAL I (CUSTOM PROCEDURE TRAY) ×3 IMPLANT
PAD ARMBOARD 7.5X6 YLW CONV (MISCELLANEOUS) ×9 IMPLANT
PATTIES SURGICAL .5 X.5 (GAUZE/BANDAGES/DRESSINGS) IMPLANT
PATTIES SURGICAL .5 X1 (DISPOSABLE) ×3 IMPLANT
SPONGE SURGIFOAM ABS GEL 100 (HEMOSTASIS) ×3 IMPLANT
SURGIFLO W/THROMBIN 8M KIT (HEMOSTASIS) IMPLANT
SUT BONE WAX W31G (SUTURE) ×3 IMPLANT
SUT MON AB 3-0 SH 27 (SUTURE) ×2
SUT MON AB 3-0 SH27 (SUTURE) ×1 IMPLANT
SUT VIC AB 1 CT1 27 (SUTURE) ×2
SUT VIC AB 1 CT1 27XBRD ANBCTR (SUTURE) ×1 IMPLANT
SUT VIC AB 2-0 CT1 18 (SUTURE) ×3 IMPLANT
SYR CONTROL 10ML LL (SYRINGE) ×3 IMPLANT
TOWEL OR 17X24 6PK STRL BLUE (TOWEL DISPOSABLE) ×3 IMPLANT
TOWEL OR 17X26 10 PK STRL BLUE (TOWEL DISPOSABLE) ×3 IMPLANT
WATER STERILE IRR 1000ML POUR (IV SOLUTION) ×3 IMPLANT
YANKAUER SUCT BULB TIP NO VENT (SUCTIONS) ×3 IMPLANT

## 2017-11-16 NOTE — Anesthesia Preprocedure Evaluation (Addendum)
Anesthesia Evaluation  Patient identified by MRN, date of birth, ID band Patient awake    Reviewed: Allergy & Precautions, NPO status , Patient's Chart, lab work & pertinent test results  Airway Mallampati: II  TM Distance: >3 FB Neck ROM: Full    Dental no notable dental hx. (+) Teeth Intact, Missing, Dental Advisory Given,    Pulmonary neg pulmonary ROS, former smoker,    Pulmonary exam normal breath sounds clear to auscultation       Cardiovascular hypertension, Pt. on medications negative cardio ROS Normal cardiovascular exam Rhythm:Regular Rate:Normal     Neuro/Psych PSYCHIATRIC DISORDERS Depression negative neurological ROS  negative psych ROS   GI/Hepatic negative GI ROS, Neg liver ROS,   Endo/Other  negative endocrine ROS  Renal/GU negative Renal ROS  negative genitourinary   Musculoskeletal negative musculoskeletal ROS (+) Arthritis , Osteoarthritis,    Abdominal   Peds negative pediatric ROS (+)  Hematology negative hematology ROS (+)   Anesthesia Other Findings   Reproductive/Obstetrics negative OB ROS                           Anesthesia Physical Anesthesia Plan  ASA: II  Anesthesia Plan: General   Post-op Pain Management:    Induction: Intravenous  PONV Risk Score and Plan: 2 and Ondansetron and Treatment may vary due to age or medical condition  Airway Management Planned: Oral ETT  Additional Equipment: None  Intra-op Plan:   Post-operative Plan: Extubation in OR  Informed Consent: I have reviewed the patients History and Physical, chart, labs and discussed the procedure including the risks, benefits and alternatives for the proposed anesthesia with the patient or authorized representative who has indicated his/her understanding and acceptance.     Plan Discussed with: CRNA and Anesthesiologist  Anesthesia Plan Comments: (  )       Anesthesia Quick  Evaluation

## 2017-11-16 NOTE — Op Note (Signed)
Operative report.  Preoperative diagnosis: Recurrent L4-5 disc herniation left side   Postoperative diagnosis: Same  Operative procedure: Revision L4-5 lumbar discectomy left side  First Assistant: Anette Riedelarmen Mayo, PA.  Indications: This is a 52 year old gentleman who had a previous L4-5 lumbar discectomy several years ago and is done well until recently.  The patient experienced severe back and terrific radicular left leg pain result of injury at home.  The patient presented to my care and repeat imaging studies confirmed a recurrent disc herniation.  Despite conservative management he continued to deteriorate.  Patient was seen by me Tuesday in the office with progressive neurological deficits and as result he was admitted to the hospital for repeat imaging and surgical intervention.  After discussing all appropriate risks benefits and alternatives  Complications: None  Report: Patient was brought the operating room placed upon the operating room table.  After successful induction of general anesthesia and endotracheally patient teds SCDs were applied and he was turned prone onto the Wilson frame.  All bony prominences well-padded and the back was prepped and draped in a standard fashion.  Timeout was taken to confirm patient, procedure, and all other important data.  2 needles were placed in the back and an x-ray was taken to localize our incision site.  Incision was infiltrated and a midline incision was made.  Sharp dissection was carried out down to the deep fascia.  Fascia was sharply incised and then I gently stripped the paraspinal muscles to expose the remaining L3 lamina and the L5 lamina.  A Penfield 4 was placed underneath the L5 for lamina and an x-ray was taken to confirm I was at the appropriate level.  Taylor retractor was placed on the lateral side of the facet complex and I began to mobilize the curette.  Knowing the bony anatomy and I was able to release the scar tissue from the  leading edge of the L5 lamina.  This allowed me to widen out the medial facetectomy.  Partition into the lateral recess.  Once I was in the lateral recess I removed all of the nerve root.  It was dorsally displaced for until I could see the annulus.  An annulotomy was performed and using nerve hooks I delivered to large fragments of disc material out allowed greater mobility of the L5 nerve root.  Once I had some decompression of the L5 nerve root I then performed medial foraminotomy of L5 to adequately decompress the nerve root.  Based on the preoperative MRI the bulk of the disc herniation was on the medial aspect of the L5 pedicle.  I then swept circumferentially at this level with my nerve hook and was able to deliver some scar tissue and disc material and removed with a micropituitary rongeur.  I continue to gently mobilize the thecal sac medially exposed the central portion of the disc herniation was very adherent to the ventral surface of the thecal sac.  A significant amount of annular scar as well.  Try my best to of this became concerned that I would cause a ventral CSF leak.  At this point I then reevaluated the L5 nerve root.  I could freely mobilize this nerve root superior to the disc space across the disc space and into the foramen.  See how the nerve was completely decompressed and I had widened out the decompression I elected to stop the procedure.  Is no active CSF leak felt as though the decompression was adequate.  I irrigated the wound  copiously normal saline and then obtained hemostasis using bipolar cautery and FloSeal.  To aid in postoperative analgesia I then irrigated again and then placed 1 cc of Depo-Medrol (40 mg).  I then placed a thrombin-soaked Gelfoam patties over the laminotomy site and then closed the wound in a layered fashion with interrupted #1 Vicryl suture, 2-0 Vicryl suture, and 3-0 Monocryl.  Steri-Strips dry dressings were then applied.  Patient was ultimately extubated  transferred the PACU without incident.  The end of the case all needle sponge counts were correct.

## 2017-11-16 NOTE — Progress Notes (Signed)
Patient returned to room from OR, alert and oreinted x3. C/o slight tingling in RLE and pain in lower back. Honeycomb dressing on lower back dry and intact.

## 2017-11-16 NOTE — Anesthesia Postprocedure Evaluation (Signed)
Anesthesia Post Note  Patient: Christopher PlumMark A Mcauley  Procedure(s) Performed: LUMBAR LAMINECTOMY/DECOMPRESSION MICRODISCECTOMY (Left )     Patient location during evaluation: PACU Anesthesia Type: General Level of consciousness: awake and alert Pain management: pain level controlled Vital Signs Assessment: post-procedure vital signs reviewed and stable Respiratory status: spontaneous breathing, nonlabored ventilation, respiratory function stable and patient connected to nasal cannula oxygen Cardiovascular status: blood pressure returned to baseline and stable Postop Assessment: no apparent nausea or vomiting Anesthetic complications: no    Last Vitals:  Vitals:   11/16/17 2030 11/16/17 2139  BP: (!) 146/90 (!) 149/48  Pulse: 93 (!) 107  Resp: 13 18  Temp: 36.7 C 36.6 C  SpO2: 98% 99%    Last Pain:  Vitals:   11/16/17 2139  TempSrc: Oral  PainSc:                  Midori Dado COKER

## 2017-11-16 NOTE — Progress Notes (Signed)
    Subjective: Procedure(s) (LRB): LUMBAR LAMINECTOMY/DECOMPRESSION MICRODISCECTOMY (Left) Day of Surgery  Patient reports pain as 6 on 0-10 scale.  Reports increased leg pain (left) reports back pain   Positive void Negative bowel movement Positive flatus Negative chest pain or shortness of breath  Objective: Vital signs in last 24 hours: Temp:  [98 F (36.7 C)-98.4 F (36.9 C)] 98.4 F (36.9 C) (01/23 1312) Pulse Rate:  [63-71] 63 (01/23 1312) Resp:  [14-18] 14 (01/23 1312) BP: (114-130)/(64-84) 114/64 (01/23 1312) SpO2:  [97 %-99 %] 98 % (01/23 1312) Weight:  [73.5 kg (162 lb)] 73.5 kg (162 lb) (01/23 1421)  Intake/Output from previous day: No intake/output data recorded.  Labs: Recent Labs    11/15/17 1634  WBC 7.4  RBC 3.67*  HCT 37.4*  PLT 152   Recent Labs    11/15/17 1634  NA 139  K 4.2  CL 107  CO2 21*  BUN 8  CREATININE 1.11  GLUCOSE 87  CALCIUM 9.4   Recent Labs    11/15/17 1634  INR 0.94    Physical Exam: Intact pulses distally Compartment soft No change in neurological exam from date of admission Body mass index is 22.59 kg/m.   Assessment/Plan: Patient stable  MRI: No significant change from the previous MRI.  He still has a large left L4-5 recurrent disc herniation with marked compression of the traversing L5 nerve root. At this point time we will plan on moving forward with revision L4-5 left discectomy.  I have again reviewed the risks and benefits of surgery the patient and all of his questions were addressed  Venita Lickahari Braysen Cloward, MD [facility] [phone]

## 2017-11-16 NOTE — Brief Op Note (Signed)
11/15/2017 - 11/16/2017  7:20 PM  PATIENT:  Christopher Underwood  52 y.o. male  PRE-OPERATIVE DIAGNOSIS:  ddd  POST-OPERATIVE DIAGNOSIS:  ddd  PROCEDURE:  Procedure(s): LUMBAR LAMINECTOMY/DECOMPRESSION MICRODISCECTOMY (Left)  SURGEON:  Surgeon(s) and Role:    Venita Lick* Tomeeka Plaugher, MD - Primary  PHYSICIAN ASSISTANT:   ASSISTANTS: Carmen Mayo   ANESTHESIA:   general  EBL:  50 mL   BLOOD ADMINISTERED:none  DRAINS: none   LOCAL MEDICATIONS USED:  MARCAINE   And depro-medrol  SPECIMEN:  No Specimen  DISPOSITION OF SPECIMEN:  N/A  COUNTS:  YES  TOURNIQUET:  * No tourniquets in log *  DICTATION: .Dragon Dictation  PLAN OF CARE: Admit to inpatient   PATIENT DISPOSITION:  PACU - hemodynamically stable.

## 2017-11-16 NOTE — Progress Notes (Signed)
Orthopedic Tech Progress Note Patient Details:  Christopher Underwood 10/02/1966 [id number] Called [employer] for brace order. Patient ID: Christopher Underwood, male   DOB: 08/28/1966, 52 y.o.   MRN: [id number]   Jennye MoccasinHughes, [name] 11/16/2017, 9:24 PM

## 2017-11-16 NOTE — Discharge Instructions (Signed)
Lumbar Diskectomy, Care After °Refer to this sheet in the next few weeks. These instructions provide you with information about caring for yourself after your procedure. Your health care provider may also give you more specific instructions. Your treatment has been planned according to current medical practices, but problems sometimes occur. Call your health care provider if you have any problems or questions after your procedure. °What can I expect after the procedure? °After the procedure, it is common to have: °· Pain. °· Numbness. °· Weakness. ° °Follow these instructions at home: °Medicines °· Take medicines only as directed by your health care provider. °· If you were prescribed an antibiotic medicine, finish all of it even if you start to feel better. °Incision care °· There are many different ways to close and cover an incision, including stitches (sutures), skin glue, and adhesive strips. Follow your health care provider's instructions about: °? Incision care. °? Bandage (dressing) changes and removal. °? Incision closure removal. °· Check your incision area every day for signs of infection. If you cannot see your incision, have someone check it for you. Watch for: °? Redness, swelling, or pain. °? Fluid, blood, or pus. °Activity °· Avoid sitting for longer than 20 minutes at a time or as directed by your health care provider. °· Do not climb stairs more than once each day until your health care provider approves. °· Do not bend at your waist. To pick things up, bend your knees. °· Do not lift anything that is heavier than 10 lb (4.5 kg) or as directed by your health care provider. °· Do not drive a car until your health care provider approves. °· Ask your health care provider when you may return to your normal activities, such as playing sports and going back to work. °· Work with your physical therapist to learn safe movement and exercises to help healing. Do these exercises as directed. °· Take short  walks often. °General instructions °· Do not use any tobacco products, including cigarettes, chewing tobacco, or electronic cigarettes. If you need help quitting, ask your health care provider. °· Follow your health care provider’s instructions about bathing. Do not take baths, shower, swim, or use a hot tub until your health care provider approves. °· Wear your back brace as directed by your health care provider. °· To prevent constipation: °? Drink enough fluid to keep your urine clear or pale yellow. °? Eat plenty of fruits, vegetables, and whole grains. °· Keep all follow-up visits as directed by your health care provider. This is important. This includes any follow-up visits with your physical therapist. °Contact a health care provider if: °· You have a fever. °· You have redness, swelling, or pain in your incision area. °· Your pain is not controlled with medicine. °· You have pain, numbness, or weakness that lasts longer than three weeks after surgery. °· You become constipated. °Get help right away if: °· You have fluid, blood, or pus coming from your incision. °· You have increasing pain, numbness, or weakness. °· You lose control of when you urinate or have a bowel movement (incontinence). °· You have chest pain. °· You have trouble breathing. °This information is not intended to replace advice given to you by your health care provider. Make sure you discuss any questions you have with your health care provider. °Document Released: 09/15/2004 Document Revised: 03/18/2016 Document Reviewed: 06/05/2014 °Elsevier Interactive Patient Education © 2018 [employer] ° °

## 2017-11-16 NOTE — Transfer of Care (Signed)
Immediate Anesthesia Transfer of Care Note  Patient: Christopher Underwood  Procedure(s) Performed: LUMBAR LAMINECTOMY/DECOMPRESSION MICRODISCECTOMY (Left )  Patient Location: PACU  Anesthesia Type:General  Level of Consciousness: awake, alert , oriented and patient cooperative  Airway & Oxygen Therapy: Patient Spontanous Breathing  Post-op Assessment: Report given to RN and Post -op Vital signs reviewed and stable  Post vital signs: Reviewed and stable  Last Vitals:  Vitals:   11/16/17 0559 11/16/17 1312  BP: 125/73 114/64  Pulse: 63 63  Resp: 18 14  Temp: 36.7 C 36.9 C  SpO2: 97% 98%    Last Pain:  Vitals:   11/16/17 1312  TempSrc: Oral  PainSc:       Patients Stated Pain Goal: 3 (11/16/17 0900)  Complications: No apparent anesthesia complications

## 2017-11-16 NOTE — Anesthesia Procedure Notes (Signed)
Procedure Name: Intubation Date/Time: 11/16/2017 5:41 PM Performed by: Barrington Ellison, CRNA Pre-anesthesia Checklist: Patient identified, Emergency Drugs available, Suction available and Patient being monitored Patient Re-evaluated:Patient Re-evaluated prior to induction Oxygen Delivery Method: Circle System Utilized Preoxygenation: Pre-oxygenation with 100% oxygen Induction Type: IV induction Ventilation: Mask ventilation without difficulty Laryngoscope Size: Mac and 4 Grade View: Grade I Tube type: Oral Tube size: 7.5 mm Number of attempts: 1 Airway Equipment and Method: Stylet and Oral airway Placement Confirmation: ETT inserted through vocal cords under direct vision,  positive ETCO2 and breath sounds checked- equal and bilateral Secured at: 22 cm Tube secured with: Tape Dental Injury: Teeth and Oropharynx as per pre-operative assessment

## 2017-11-17 ENCOUNTER — Ambulatory Visit: Admit: 2017-11-17 | Payer: Medicaid Other | Admitting: Orthopedic Surgery

## 2017-11-17 ENCOUNTER — Encounter (HOSPITAL_COMMUNITY): Payer: Self-pay | Admitting: Orthopedic Surgery

## 2017-11-17 SURGERY — LUMBAR LAMINECTOMY/DECOMPRESSION MICRODISCECTOMY 1 LEVEL
Anesthesia: General | Laterality: Left

## 2017-11-17 MED FILL — Thrombin For Soln 20000 Unit: CUTANEOUS | Qty: 1 | Status: AC

## 2017-11-17 NOTE — Care Management Note (Signed)
Case Management Note  Patient Details  Name: Christopher Underwood MRN: [id number] Date of Birth: 06/28/1966  Subjective/Objective:        Admit for LUMBAR LAMINECTOMY/DECOMPRESSION MICRODISCECTOMY (Left).             Action/Plan: Transition to home today.  Expected Discharge Date:  11/17/17               Expected Discharge Plan:  Home/Self Care  In-House Referral:     Discharge planning Services  CM Consult  Post Acute Care Choice:    Choice offered to:  Patient  DME Arranged:  3-N-1(declined rolling walker) DME Agency:  [facility]  [facility]:     Status of Service:  Completed, signed off  If discussed at Long Length of Stay Meetings, dates discussed:    Additional Comments:  Epifanio LeschesCole, Jannetta Massey Hudson, RN 11/17/2017, 12:04 PM

## 2017-11-17 NOTE — Progress Notes (Signed)
Patient was unable to ambulate this shift as requested by MD. Patient should not be up without the Aspen Lumbar Brace and it was not avaialble. Will be delivered today. MD notified.

## 2017-11-17 NOTE — Progress Notes (Signed)
Christopher PlumMark A Kevorkian to be D/C'd Home per MD order.  Discussed with the patient and all questions fully answered.  VSS, Skin clean, dry and intact without evidence of skin break down, no evidence of skin tears noted. Dressing clean, dry and intact. IV catheter discontinued intact. Site without signs and symptoms of complications. Dressing and pressure applied.  An After Visit Summary was printed and given to the patient. Patient received prescription.  D/c education completed with patient/family including follow up instructions, medication list, d/c activities limitations if indicated, with other d/c instructions as indicated by MD - patient able to verbalize understanding, all questions fully answered.   Patient instructed to return to ED, call 911, or call MD for any changes in condition.   Patient is waiting on his wife to pick him up. Will continue to assess.  Christopher Underwood 11/17/2017 1:52 PM

## 2017-11-17 NOTE — Progress Notes (Signed)
    Subjective: 1 Day Post-Op Procedure(s) (LRB): LUMBAR LAMINECTOMY/DECOMPRESSION MICRODISCECTOMY (Left) Patient reports pain as 7 on 0-10 scale.   Denies CP or SOB.  Voiding without difficulty. Positive flatus.  Decreased leg pain Objective: Vital signs in last 24 hours: Temp:  [97.9 F (36.6 C)-98.6 F (37 C)] 98.2 F (36.8 C) (01/24 0443) Pulse Rate:  [63-108] 86 (01/24 0443) Resp:  [8-21] 18 (01/24 0023) BP: (114-149)/(48-91) 136/80 (01/24 0443) SpO2:  [96 %-100 %] 100 % (01/24 0443) Weight:  [73.5 kg (162 lb)] 73.5 kg (162 lb) (01/23 1421)  Intake/Output from previous day: 01/23 0701 - 01/24 0700 In: 2328.8 [P.O.:150; I.V.:2078.8; IV Piggyback:100] Out: 50 [Blood:50] Intake/Output this shift: No intake/output data recorded.  Labs: Recent Labs    11/15/17 1634  HGB 12.8*   Recent Labs    11/15/17 1634  WBC 7.4  RBC 3.67*  HCT 37.4*  PLT 152   Recent Labs    11/15/17 1634  NA 139  K 4.2  CL 107  CO2 21*  BUN 8  CREATININE 1.11  GLUCOSE 87  CALCIUM 9.4   Recent Labs    11/15/17 1634  INR 0.94    Physical Exam: Neurologically intact ABD soft Sensation intact distally Dorsiflexion/Plantar flexion intact Incision: scant drainage Compartment soft Body mass index is 22.59 kg/m.   Assessment/Plan: 1 Day Post-Op Procedure(s) (LRB): LUMBAR LAMINECTOMY/DECOMPRESSION MICRODISCECTOMY (Left) Advance diet Up with therapy  Pt may DC home after cleared by PT  Christopher Underwood, Baxter Kailarmen Christina for Dr. Venita Lickahari Brooks [facility] [phone] 11/17/2017, 7:37 AM

## 2017-11-17 NOTE — Anesthesia Postprocedure Evaluation (Signed)
Anesthesia Post Note  Patient: Christopher Underwood  Procedure(s) Performed: LUMBAR LAMINECTOMY/DECOMPRESSION MICRODISCECTOMY (Left )     Patient location during evaluation: PACU Anesthesia Type: General Level of consciousness: awake and alert Pain management: pain level controlled Vital Signs Assessment: post-procedure vital signs reviewed and stable Respiratory status: spontaneous breathing, nonlabored ventilation, respiratory function stable and patient connected to nasal cannula oxygen Cardiovascular status: blood pressure returned to baseline and stable Postop Assessment: no apparent nausea or vomiting Anesthetic complications: no    Last Vitals:  Vitals:   11/17/17 0947 11/17/17 1213  BP: (!) 115/94 103/86  Pulse:  96  Resp:  18  Temp:  36.8 C  SpO2:  97%    Last Pain:  Vitals:   11/17/17 1315  TempSrc:   PainSc: 6                  Yacoub Diltz COKER
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
## 2017-11-17 NOTE — Progress Notes (Signed)
PT Note PT eval complete.  No F/u needed.  Does need a RW and 3N1.  OT will address adaptive equipment.  Full note to follow.  Sign off.  THanks.  [facility] Danelia Snodgrass,PT Acute Rehabilitation [phone] (pager)

## 2017-11-17 NOTE — Progress Notes (Signed)
CM received consult :  Home health needs     Equipment    Medication needs    Other (see comments)       Comments   PT/OT/SLP DME as needed   PT/OT  evaluations pending .... CM to f/u with disposition needs. Gae GallopAngela Jakylah Bassinger RN,BSN,CM
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
## 2017-11-22 IMAGING — CT CT ABD-PELV W/ CM
2 of 5 series · 16 of 46 positions shown, 18 images · IV contrast (ISOVUE)
Comparison: CT abdomen and pelvis 06/16/2016.

CLINICAL DATA: Nausea, vomiting, back and abdominal pain since
01/23/2017.

EXAM:
CT ABDOMEN AND PELVIS WITH CONTRAST
TECHNIQUE: Multidetector CT imaging of the abdomen and pelvis was performed
using the standard protocol following bolus administration of
intravenous contrast.
CONTRAST:  100 ml 6QHHY1-8TT IOPAMIDOL (6QHHY1-8TT) INJECTION 61%

[Series 2: abd/pel with · axial · 0.74mm/px · z∈[-453,-48]mm · 13 of 93 slices shown, 15 images]
[im 6/93  soft-tissue]
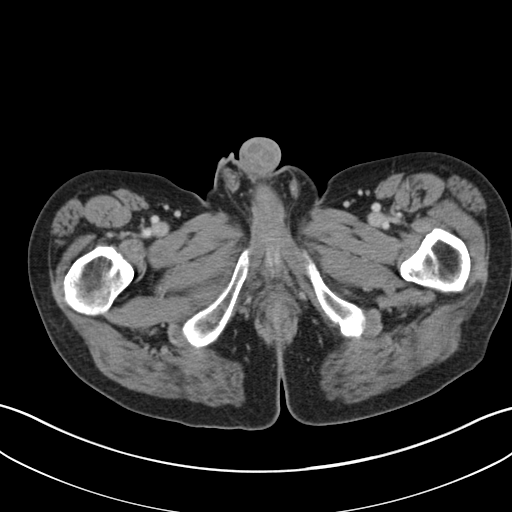
[im 6/93  bone]
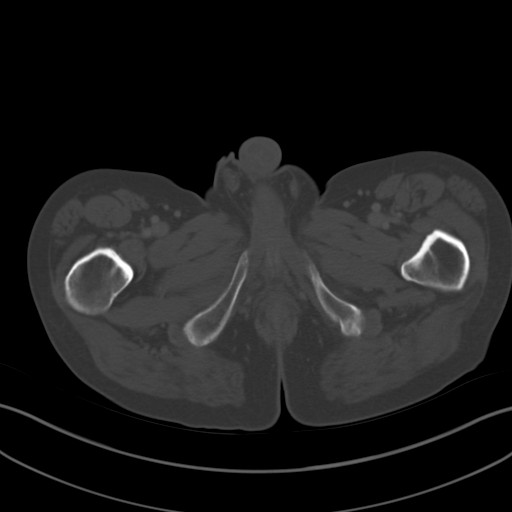
[im 12/93  soft-tissue]
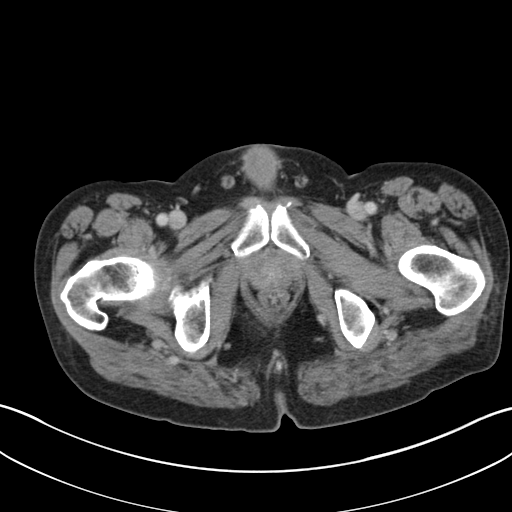
[im 18/93  soft-tissue]
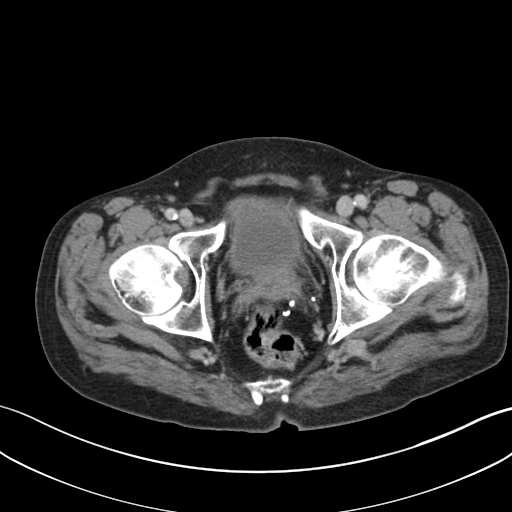
[im 29/93  soft-tissue]
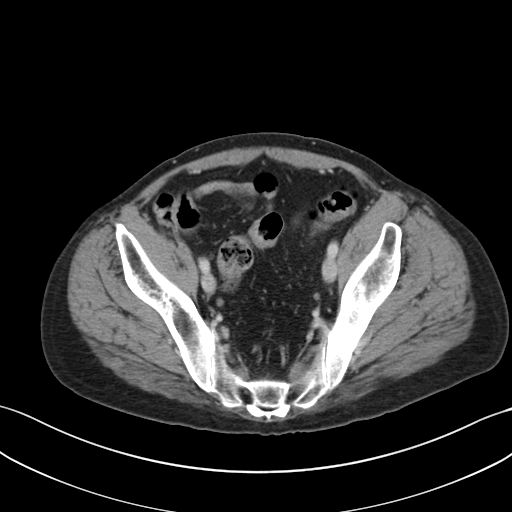
[im 35/93  soft-tissue]
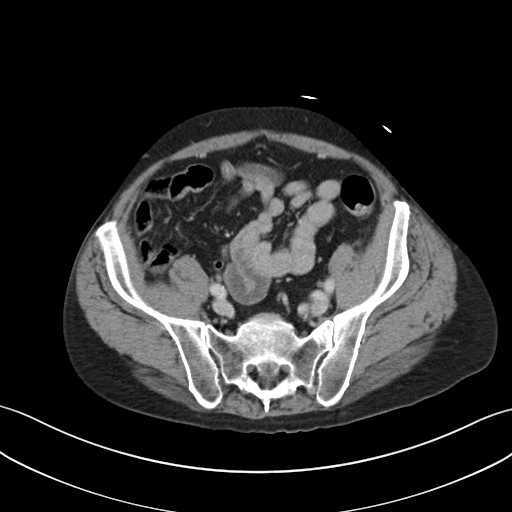
[im 41/93  soft-tissue]
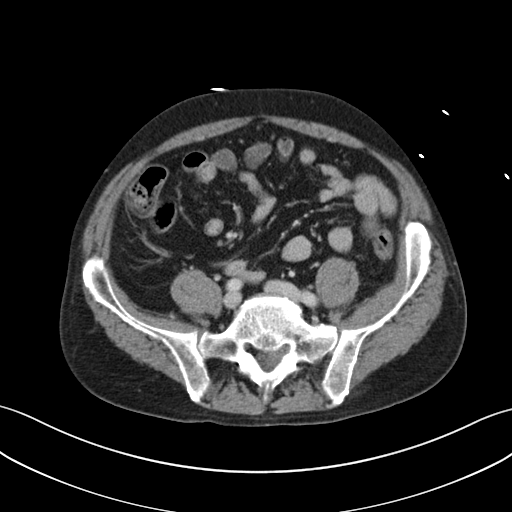
[im 47/93  soft-tissue]
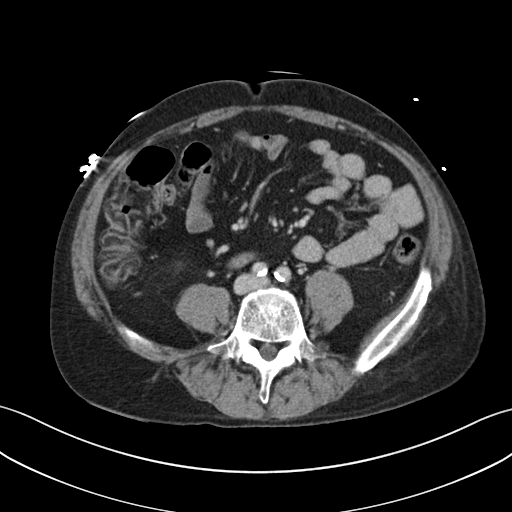
[im 52/93  soft-tissue]
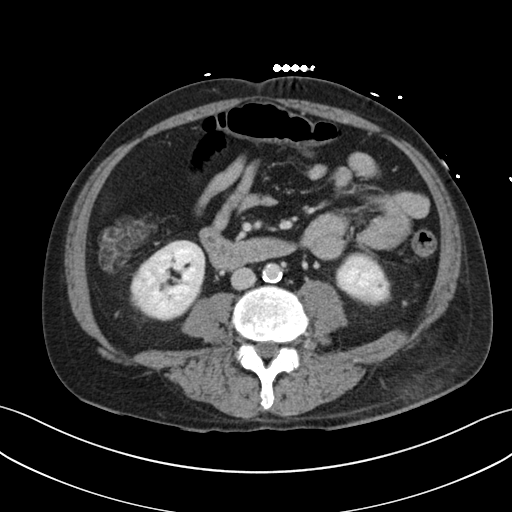
[im 58/93  soft-tissue]
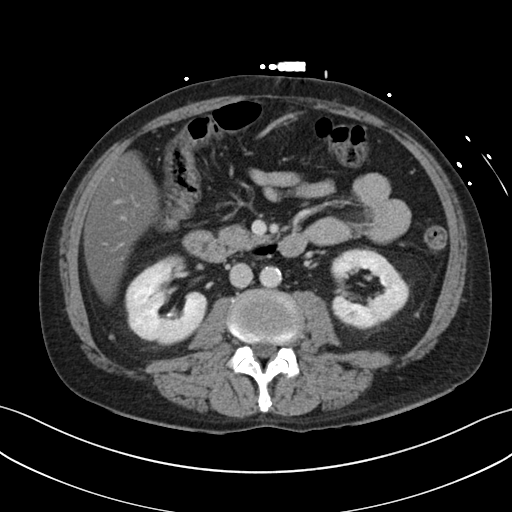
[im 58/93  bone]
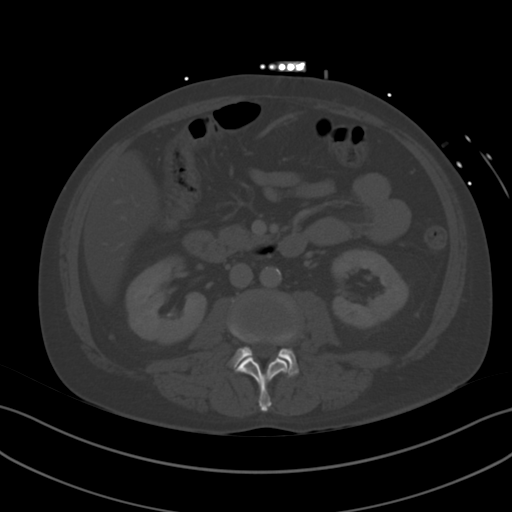
[im 64/93  soft-tissue]
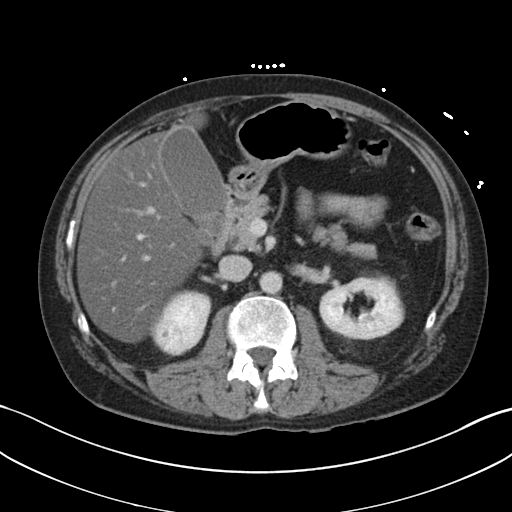
[im 75/93  soft-tissue]
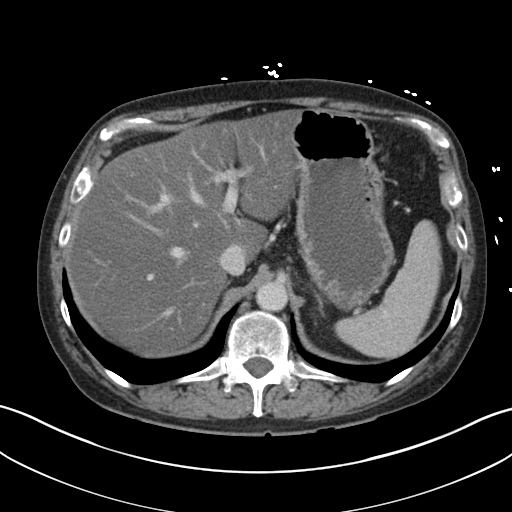
[im 81/93  soft-tissue]
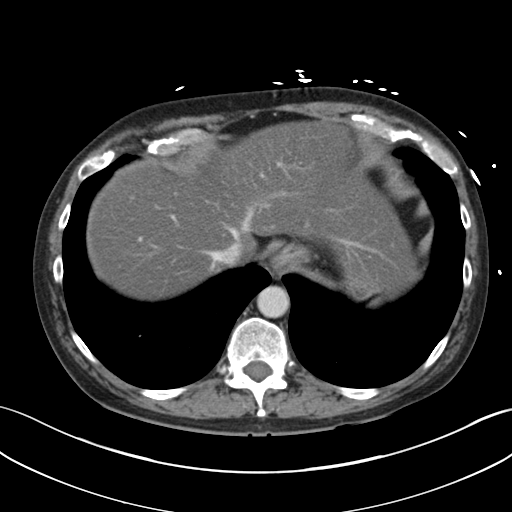
[im 87/93  soft-tissue]
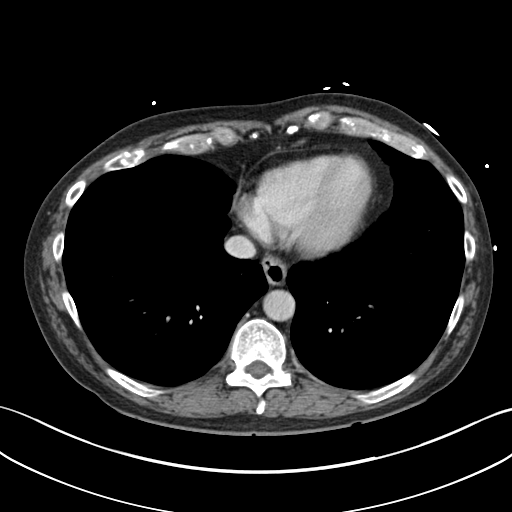

[Series 3: coronal a/|p · coronal · 0.74mm/px · 3 of 190 slices shown]
[im 64/190  soft-tissue]
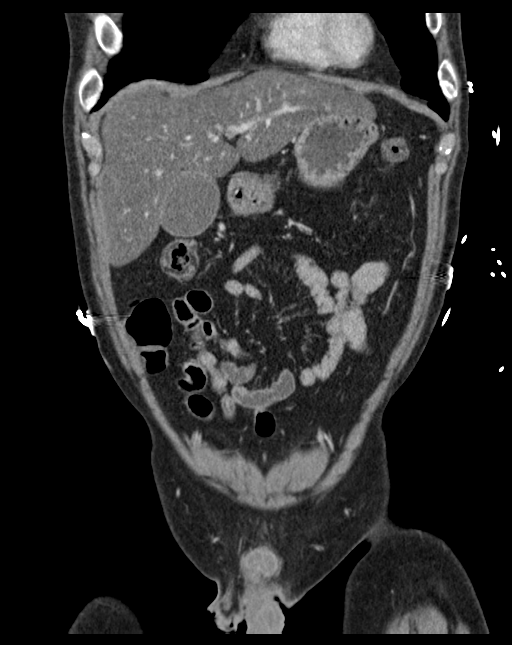
[im 85/190  soft-tissue]
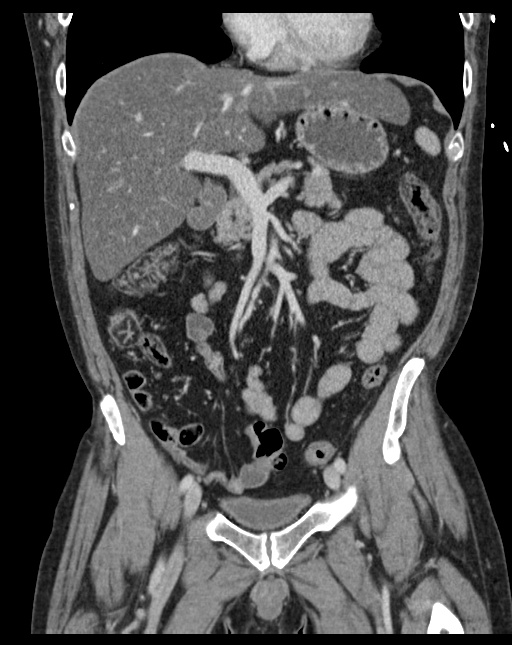
[im 106/190  soft-tissue]
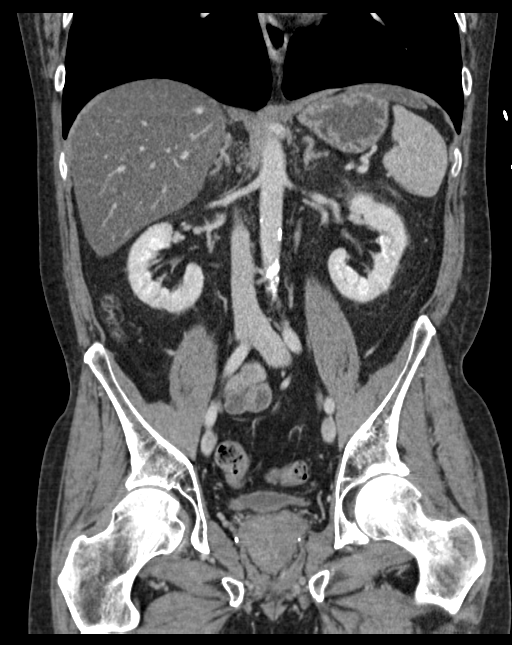

[16 of 46 positions shown; findings below may reference images not displayed]

FINDINGS: Lower chest: Heart size is normal. No pleural or pericardial
effusion. Lung bases are clear.

Hepatobiliary: No focal lesion. No focal liver lesion. The liver is
markedly low attenuating consistent with fatty infiltration. The
gallbladder and biliary tree appear normal.

Pancreas: Unremarkable. No pancreatic ductal dilatation or
surrounding inflammatory changes.

Spleen: Normal in size without focal abnormality.

Adrenals/Urinary Tract: Adrenal glands are unremarkable. Kidneys are
normal, without renal calculi, focal lesion, or hydronephrosis.
Bladder is unremarkable.

Stomach/Bowel: The stomach, small bowel and appendix appear normal.
There is some fatty infiltration of the walls of the ascending and
proximal transverse colon, likely incidental. The colon otherwise
appears normal.

Vascular/Lymphatic: Age advanced aortoiliac atherosclerosis without
aneurysm is seen.

Reproductive: Prostate is unremarkable.

Other: No fluid collection or hernia.

Musculoskeletal: No fracture or worrisome lesion. A few Schmorl's
nodes are unchanged.
IMPRESSION: No acute abnormality abdomen or pelvis.

Marked fatty infiltration of the liver.

Age advanced atherosclerosis.

## 2017-11-22 NOTE — Discharge Summary (Signed)
Physician Discharge Summary  Patient ID: Christopher Underwood MRN: [id number] DOB/AGE: 05/19/1966 52 y.o.  Admit date: 11/15/2017 Discharge date: 11/17/17  Admission Diagnoses:  Lumbar Disc Herniation  Discharge Diagnoses:  Active Problems:   Lumbar disc herniation   Status post lumbar spine operative procedure for decompression of spinal cord   Past Medical History:  Diagnosis Date  . Arthritis   . Attention deficit disorder   . Depression   . Hypertension     Surgeries: Procedure(s): LUMBAR LAMINECTOMY/DECOMPRESSION MICRODISCECTOMY on 11/16/2017   Consultants (if any):   Discharged Condition: Improved  Hospital Course: Christopher Underwood is an 52 y.o. male who was admitted 11/15/2017 with a diagnosis of Lumbar Disc Herniation and went to the operating room on 11/16/2017 and underwent the above named procedures.  Post op day one pt reports moderate pain controlled on oral medication. Pt is ambulating in hallway.  Pt is urinating w/o difficulty.  Pt denies nausea.  Pt is cleared by PT before DC.   He was given perioperative antibiotics:  Anti-infectives (From admission, onward)   Start     Dose/Rate Route Frequency Ordered Stop   11/17/17 0130  ceFAZolin (ANCEF) IVPB 2g/100 mL premix     2 g 200 mL/hr over 30 Minutes Intravenous Every 8 hours 11/16/17 2050 11/17/17 1019   11/16/17 1600  ceFAZolin (ANCEF) IVPB 2g/100 mL premix  Status:  Discontinued     2 g 200 mL/hr over 30 Minutes Intravenous  Once 11/15/17 2358 11/15/17 2359   11/16/17 1600  ceFAZolin (ANCEF) IVPB 2g/100 mL premix     2 g 200 mL/hr over 30 Minutes Intravenous On call to O.R. 11/15/17 2359 11/16/17 1745   11/16/17 0000  ceFAZolin (ANCEF) IVPB 2g/100 mL premix  Status:  Discontinued     2 g 200 mL/hr over 30 Minutes Intravenous  Once 11/15/17 1843 11/15/17 2359   11/15/17 1630  ceFAZolin (ANCEF) powder 1 g  Status:  Discontinued     1 g Other To Surgery 11/15/17 1628 11/15/17 1844    .  He was given sequential  compression devices, early ambulation, and TED for DVT prophylaxis.  He benefited maximally from the hospital stay and there were no complications.    Recent vital signs:  Vitals:   11/17/17 0947 11/17/17 1213  BP: (!) 115/94 103/86  Pulse:  96  Resp:  18  Temp:  98.2 F (36.8 C)  SpO2:  97%    Recent laboratory studies:  Lab Results  Component Value Date   HGB 12.8 (L) 11/15/2017   HGB 13.5 01/25/2017   HGB 13.0 06/18/2016   Lab Results  Component Value Date   WBC 7.4 11/15/2017   PLT 152 11/15/2017   Lab Results  Component Value Date   INR 0.94 11/15/2017   Lab Results  Component Value Date   NA 139 11/15/2017   K 4.2 11/15/2017   CL 107 11/15/2017   CO2 21 (L) 11/15/2017   BUN 8 11/15/2017   CREATININE 1.11 11/15/2017   GLUCOSE 87 11/15/2017    Discharge Medications:   Allergies as of 11/17/2017   No Known Allergies     Medication List    STOP taking these medications   aspirin EC 81 MG tablet   ibuprofen 200 MG tablet Commonly known as:  ADVIL,MOTRIN   ibuprofen 800 MG tablet Commonly known as:  ADVIL,MOTRIN     TAKE these medications   cholecalciferol 1000 units tablet Commonly known as:  VITAMIN D  Take 1,000 Units by mouth daily.   lisinopril-hydrochlorothiazide 20-12.5 MG tablet Commonly known as:  PRINZIDE,ZESTORETIC Take 1 tablet by mouth daily.   methocarbamol 500 MG tablet Commonly known as:  ROBAXIN Take 1 tablet (500 mg total) by mouth 3 (three) times daily.   ondansetron 4 MG disintegrating tablet Commonly known as:  ZOFRAN ODT Take 1 tablet (4 mg total) by mouth every 8 (eight) hours as needed for nausea or vomiting.   oxyCODONE-acetaminophen 10-325 MG tablet Commonly known as:  PERCOCET Take 1 tablet by mouth every 4 (four) hours as needed for pain.   POTASSIUM PO Take 1 tablet by mouth daily.   sertraline 25 MG tablet Commonly known as:  ZOLOFT Take 25 mg by mouth daily.   vitamin C 500 MG tablet Commonly known  as:  ASCORBIC ACID Take 500 mg by mouth daily.       Diagnostic Studies: Dg Chest 1 View  Result Date: 11/15/2017 CLINICAL DATA:  Preop lumbar surgery EXAM: CHEST 1 VIEW COMPARISON:  08/10/2017, 07/23/2015 FINDINGS: No acute pulmonary infiltrate or effusion is seen. Normal heart size. Aortic atherosclerosis. No pneumothorax. Surgical changes in the cervical spine. Vague oval opacity in the left lower lung, similar appearance to 07/23/2015. IMPRESSION: No active disease. Electronically Signed   By: Jasmine PangKim  Fujinaga M.D.   On: 11/15/2017 20:03   Dg Lumbar Spine 2-3 Views  Result Date: 11/16/2017 CLINICAL DATA:  52 year old male with a history of L4-L5 paracentral disc extrusion, presenting for L4-L5 micro diskectomy EXAM: LUMBAR SPINE - 2-3 VIEW COMPARISON:  MR 11/15/2017 FINDINGS: Set of 2 cross-table lateral of the lumbar spine from the operating room. Initial image demonstrates fiducial markers overlying the L5 level and the base of the sacrum. Image labeled 2 demonstrates the fiducial marker at the L4-L5 facet with tissue retractor in the soft tissues. IMPRESSION: Cross-table lateral images of the lumbar spine demonstrating approach to the L4-L5 disc space, as above. Result was called to the operating room at 6:20 p.m. Electronically Signed   By: Gilmer MorJaime  Wagner D.O.   On: 11/16/2017 18:21   Mr Lumbar Spine Wo Contrast  Result Date: 11/15/2017 CLINICAL DATA:  Radiculopathy. Numbness extending to the left leg and foot with tingling and pain. EXAM: MRI LUMBAR SPINE WITHOUT CONTRAST TECHNIQUE: Multiplanar, multisequence MR imaging of the lumbar spine was performed. No intravenous contrast was administered. COMPARISON:  Radiography 08/10/2017 FINDINGS: Segmentation: Transitional L5 vertebra which is non segmented from the sacrum at the transverse processes. This numbering is based on 12 paired ribs on contemporaneous chest x-ray. Numbering differs from comparison radiography. Alignment:  Physiologic.  Vertebrae: Remote T12, L3 and L4 superior endplate fractures. Mildly heterogeneous marrow. No acute fracture, discitis, or aggressive bone lesion. Conus medullaris and cauda equina: Conus extends to the T12-L1 level. Conus and cauda equina appear normal. Paraspinal and other soft tissues: Distended gallbladder with normal common bile duct and main pancreatic duct diameters. Disc levels: T12- L1: Unremarkable. L1-L2: Unremarkable. L2-L3: Mild leftward disc bulging.  No impingement L3-L4: Mild disc narrowing and bulging. Mild facet hypertrophy. No impingement L4-L5: Disc narrowing and desiccation. There is a disc extrusion left paracentral that inferiorly migrates to the mid L5 body level and impinges on the left more than right L5 nerve roots. Previous left laminotomy. Noncompressive bilateral foraminal narrowing. L5-S1:Incomplete segmentation with no degenerative changes IMPRESSION: 1. L4-5 left paracentral downward migrating disc extrusion with left more than right L5 impingement in the subarticular recesses. Prior laminotomy on the left at L4-5. 2.  Noncompressive degenerative changes elsewhere are described above. 3. Remote T12, L3, and L4 superior endplate fractures. 4. Transitional lumbosacral anatomy. Based on the lowest ribs there is a partially segmented L5 vertebra. Electronically Signed   By: Marnee Spring M.D.   On: 11/15/2017 19:00    Disposition: 01-Home or Self Care Pt will present to clinic in 2 weeks Post op medication provided Discharge Instructions    Incentive spirometry RT   Complete by:  As directed       Follow-up Information    Venita Lick, MD Follow up in 2 week(s).   Specialty:  Orthopedic Surgery Contact information: [address] [place] [address] [phone]        [facility] - Dme Follow up.   Why:  3 IN 1/ Bedside commode will be delivered to bedside prior to discharge Contact information: [address] [place] [address] [phone]            Signed: Kirt Boys 11/22/2017, 4:06 PM

## 2019-07-23 ENCOUNTER — Encounter (HOSPITAL_COMMUNITY): Payer: Self-pay | Admitting: Emergency Medicine

## 2019-07-23 DIAGNOSIS — Z79899 Other long term (current) drug therapy: Secondary | ICD-10-CM | POA: Insufficient documentation

## 2019-07-23 DIAGNOSIS — I10 Essential (primary) hypertension: Secondary | ICD-10-CM | POA: Diagnosis not present

## 2019-07-23 DIAGNOSIS — R42 Dizziness and giddiness: Secondary | ICD-10-CM | POA: Diagnosis not present

## 2019-07-23 DIAGNOSIS — Z87891 Personal history of nicotine dependence: Secondary | ICD-10-CM | POA: Insufficient documentation

## 2019-07-23 DIAGNOSIS — R110 Nausea: Secondary | ICD-10-CM | POA: Diagnosis present

## 2019-07-23 MED ORDER — SODIUM CHLORIDE 0.9% FLUSH
3.0000 mL | Freq: Once | INTRAVENOUS | Status: AC
  Administered 2019-07-24: 3 mL via INTRAVENOUS

## 2019-07-23 NOTE — ED Triage Notes (Signed)
Patient here from home via EMS with complaints of nausea vomiting. Reports that he was seen in March for sepsis and would like to get "checked out". Vomiting in triage.

## 2019-07-24 ENCOUNTER — Emergency Department (HOSPITAL_COMMUNITY)
Admission: EM | Admit: 2019-07-24 | Discharge: 2019-07-24 | Disposition: A | Discharge status: Home / Self Care | Payer: Medicaid Other | Attending: Emergency Medicine | Admitting: Emergency Medicine

## 2019-07-24 DIAGNOSIS — R42 Dizziness and giddiness: Secondary | ICD-10-CM

## 2019-07-24 LAB — CBC
HCT: 38.7 % — ABNORMAL LOW (ref 39.0–52.0)
Hemoglobin: 13.2 g/dL (ref 13.0–17.0)
MCH: 35.6 pg — ABNORMAL HIGH (ref 26.0–34.0)
MCHC: 34.1 g/dL (ref 30.0–36.0)
MCV: 104.3 fL — ABNORMAL HIGH (ref 80.0–100.0)
Platelets: 226 10*3/uL (ref 150–400)
RBC: 3.71 MIL/uL — ABNORMAL LOW (ref 4.22–5.81)
RDW: 13.2 % (ref 11.5–15.5)
WBC: 13.6 10*3/uL — ABNORMAL HIGH (ref 4.0–10.5)
nRBC: 0 % (ref 0.0–0.2)

## 2019-07-24 LAB — COMPREHENSIVE METABOLIC PANEL
ALT: 61 U/L — ABNORMAL HIGH (ref 0–44)
AST: 51 U/L — ABNORMAL HIGH (ref 15–41)
Albumin: 3.8 g/dL (ref 3.5–5.0)
Alkaline Phosphatase: 98 U/L (ref 38–126)
Anion gap: 15 (ref 5–15)
BUN: 11 mg/dL (ref 6–20)
CO2: 21 mmol/L — ABNORMAL LOW (ref 22–32)
Calcium: 9.3 mg/dL (ref 8.9–10.3)
Chloride: 103 mmol/L (ref 98–111)
Creatinine, Ser: 0.7 mg/dL (ref 0.61–1.24)
GFR calc Af Amer: >60 mL/min (ref >60)
GFR calc non Af Amer: >60 mL/min (ref >60)
Glucose, Bld: 123 mg/dL — ABNORMAL HIGH (ref 70–99)
Potassium: 3.5 mmol/L (ref 3.5–5.1)
Sodium: 139 mmol/L (ref 135–145)
Total Bilirubin: 1 mg/dL (ref 0.3–1.2)
Total Protein: 7.9 g/dL (ref 6.5–8.1)

## 2019-07-24 LAB — LIPASE, BLOOD: Lipase: 30 U/L (ref 11–51)

## 2019-07-24 LAB — LACTIC ACID, PLASMA: Lactic Acid, Venous: 1.1 mmol/L (ref 0.5–1.9)

## 2019-07-24 MED ORDER — SODIUM CHLORIDE 0.9 % IV BOLUS
1000.0000 mL | Freq: Once | INTRAVENOUS | Status: AC
  Administered 2019-07-24: 1000 mL via INTRAVENOUS

## 2019-07-24 MED ORDER — MECLIZINE HCL 25 MG PO TABS
25.0000 mg | ORAL_TABLET | Freq: Once | ORAL | Status: AC
  Administered 2019-07-24: 05:00:00 25 mg via ORAL
  Filled 2019-07-24: qty 1

## 2019-07-24 MED ORDER — ONDANSETRON 4 MG PO TBDP: 4.0000 mg | ORAL_TABLET | Freq: Three times a day (TID) | ORAL | 0 refills | Status: DC | PRN

## 2019-07-24 MED ORDER — DIAZEPAM 5 MG/ML IJ SOLN
2.5000 mg | Freq: Once | INTRAMUSCULAR | Status: AC
  Administered 2019-07-24: 2.5 mg via INTRAVENOUS
  Filled 2019-07-24: qty 2

## 2019-07-24 MED ORDER — MECLIZINE HCL 25 MG PO TABS: 25.0000 mg | ORAL_TABLET | Freq: Three times a day (TID) | ORAL | 0 refills | Status: DC | PRN

## 2019-07-24 NOTE — Discharge Instructions (Addendum)
Your seen today for dizziness.  Your presentation is consistent with benign peripheral vertigo.  Make sure to stay hydrated.  Take meclizine as needed.  Use Zofran for any nausea and vomiting.

## 2019-07-24 NOTE — ED Provider Notes (Signed)
[facility]-EMERGENCY DEPT Provider Note   CSN: [id number] Arrival date & time: 07/23/19  2200     History   Chief Complaint Chief Complaint  Patient presents with  . Nausea  . Emesis    HPI Jackie PlumMark A Mccarron is a 53 y.o. male.     HPI  This is a 53 year old male with a history of depression and hypertension who presents with dizziness.  Patient reports that he woke up yesterday morning at 3 AM and had room spinning dizziness.  All day while he describes room spinning dizziness worse with position changes.  Additionally he reports multiple episodes of nonbilious, nonbloody emesis.  He developed sore throat after the frequent episodes of emesis.  Denies any recent fevers or upper respiratory symptoms.  Denies any strokelike symptoms, weakness, numbness, vision changes.  Patient reports 1 prior episode of similar symptoms.  He has never been diagnosed with vertigo.  He was given Zofran by EMS but had persistent symptoms in the waiting room.  Past Medical History:  Diagnosis Date  . Arthritis   . Attention deficit disorder   . Depression   . Hypertension     Patient Active Problem List   Diagnosis Date Noted  . Status post lumbar spine operative procedure for decompression of spinal cord 11/16/2017  . 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 Hypokalemia 06/16/2016  . Abdominal pain   . 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 cervical fusion with plating x 2  2000  . left knee arthroscopy  1996   x 2  . lower back surgery  2011  . LUMBAR LAMINECTOMY/DECOMPRESSION MICRODISCECTOMY Left 11/16/2017   Procedure: LUMBAR LAMINECTOMY/DECOMPRESSION MICRODISCECTOMY;  Surgeon: Venita LickBrooks, Dahari, MD;  [facility];  Service: Orthopedics;  Laterality: Left;  . right arm surgery for donor skin graft  2000  . right foot surgery to clean out arthritis  2011  . right heel reconstruction   2000  . right heel surgery  to remove screw  2000  . right heel surgery for fx  2010  . right heel wound I and D for infection   2011        Home Medications    Prior to Admission medications   Medication Sig Start Date End Date Taking? Authorizing Provider  cholecalciferol (VITAMIN D) 1000 units tablet Take 1,000 Units by mouth daily.     [provider]  lisinopril-hydrochlorothiazide (PRINZIDE,ZESTORETIC) 20-12.5 MG tablet Take 1 tablet by mouth daily.    [provider]  meclizine (ANTIVERT) 25 MG tablet Take 1 tablet (25 mg total) by mouth 3 (three) times daily as needed for dizziness. 07/24/19   Quaniya Damas, Mayer Maskerourtney F, MD  methocarbamol (ROBAXIN) 500 MG tablet Take 1 tablet (500 mg total) by mouth 3 (three) times daily. 11/16/17   Mayo, Baxter Kailarmen Christina, PA-C  ondansetron (ZOFRAN ODT) 4 MG disintegrating tablet Take 1 tablet (4 mg total) by mouth every 8 (eight) hours as needed for nausea or vomiting. 07/24/19   Rosco Harriott, Mayer Maskerourtney F, MD  POTASSIUM PO Take 1 tablet by mouth daily.    [provider]  sertraline (ZOLOFT) 25 MG tablet Take 25 mg by mouth daily. 08/17/17   [provider]  vitamin C (ASCORBIC ACID) 500 MG tablet Take 500 mg by mouth daily.     [provider]    Family History Family History  Problem Relation Age of Onset  .  Diabetes Other   . Hypertension Other   . Heart attack Other     Social History Social History   Tobacco Use  . Smoking status: Former Smoker    Packs/day: 0.25    Years: 1.00    Pack years: 0.25    Types: Cigarettes    Quit date: 10/26/1983    Years since quitting: 35.7  . Smokeless tobacco: Never Used  Substance Use Topics  . Alcohol use: Yes    Comment: every other day-glass of wine  . Drug use: No     Allergies   Patient has no known allergies.   Review of Systems Review of Systems  Constitutional: Negative for fever.  Respiratory: Negative for shortness of breath.   Cardiovascular:  Negative for chest pain.  Gastrointestinal: Positive for nausea and vomiting. Negative for abdominal pain.  Musculoskeletal: Positive for back pain.  Neurological: Positive for dizziness. Negative for headaches.  All other systems reviewed and are negative.    Physical Exam Updated Vital Signs BP (!) 160/91 (BP Location: Right Arm)   Pulse 68   Temp 98.3 F (36.8 C) (Oral)   Resp 16   SpO2 100%   Physical Exam Vitals signs and nursing note reviewed.  Constitutional:      Appearance: He is well-developed.  HENT:     Head: Normocephalic and atraumatic.     Mouth/Throat:     Mouth: Mucous membranes are moist.  Eyes:     Pupils: Pupils are equal, round, and reactive to light.     Comments: Nystagmus noted with leftward gaze  Neck:     Musculoskeletal: Neck supple.  Cardiovascular:     Rate and Rhythm: Normal rate and regular rhythm.     Heart sounds: Normal heart sounds. No murmur.  Pulmonary:     Effort: Pulmonary effort is normal. No respiratory distress.     Breath sounds: Normal breath sounds. No wheezing.  Abdominal:     General: Bowel sounds are normal.     Palpations: Abdomen is soft.     Tenderness: There is no abdominal tenderness.  Musculoskeletal:     Right lower leg: No edema.     Left lower leg: No edema.  Lymphadenopathy:     Cervical: No cervical adenopathy.  Skin:    General: Skin is warm and dry.  Neurological:     Mental Status: He is alert and oriented to person, place, and time.     Comments: Cranial nerves II through XII intact, 5-5 strength in all 4 extremities, no dysmetria to finger-nose-finger  Psychiatric:        Mood and Affect: Mood normal.      ED Treatments / Results  Labs (all labs ordered are listed, but only abnormal results are displayed) Labs Reviewed  COMPREHENSIVE METABOLIC PANEL - Abnormal; Notable for the following components:      Result Value   CO2 21 (*)    Glucose, Bld 123 (*)    AST 51 (*)    ALT 61 (*)    All  other components within normal limits  CBC - Abnormal; Notable for the following components:   WBC 13.6 (*)    RBC 3.71 (*)    HCT 38.7 (*)    MCV 104.3 (*)    MCH 35.6 (*)    All other components within normal limits  LIPASE, BLOOD  LACTIC ACID, PLASMA  URINALYSIS, ROUTINE W REFLEX MICROSCOPIC    EKG None  Radiology No results found.  Procedures Procedures (including critical care time)  Medications Ordered in ED Medications  sodium chloride flush (NS) 0.9 % injection 3 mL (3 mLs Intravenous Given 07/24/19 0458)  sodium chloride 0.9 % bolus 1,000 mL (1,000 mLs Intravenous New Bag/Given 07/24/19 0457)  diazepam (VALIUM) injection 2.5 mg (2.5 mg Intravenous Given 07/24/19 0458)  meclizine (ANTIVERT) tablet 25 mg (25 mg Oral Given 07/24/19 0457)     Initial Impression / Assessment and Plan / ED Course  I have reviewed the triage vital signs and the nursing notes.  Pertinent labs & imaging results that were available during my care of the patient were reviewed by me and considered in my medical decision making (see chart for details).        Patient presents with dizziness.  Also reports vomiting.  He is overall nontoxic-appearing and vital signs are reassuring.  He is neurologically intact.  History is most suspicious for vertigo.  Suspect benign positional vertigo as patient is otherwise neurologically intact.  Patient was given Valium and meclizine as well as fluids.  Lab work is at the patient's baseline.  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  He has no shortness of breath.  On multiple rechecks, he reports improvement.  He is able to tolerate fluids without vomiting.  Will discharge home with meclizine and Zofran.  After history, exam, and medical workup I feel the patient has been appropriately medically screened and is safe for discharge home. Pertinent diagnoses were discussed with the patient. Patient was given return precautions.    Final Clinical Impressions(s) /  ED Diagnoses   Final diagnoses:  Vertigo    ED Discharge Orders         Ordered    meclizine (ANTIVERT) 25 MG tablet  3 times daily PRN     07/24/19 0657    ondansetron (ZOFRAN ODT) 4 MG disintegrating tablet  Every 8 hours PRN     07/24/19 0657           Merryl Hacker, MD 07/24/19 0700

## 2019-12-20 ENCOUNTER — Encounter: Payer: Self-pay | Admitting: Neurology

## 2020-01-11 ENCOUNTER — Emergency Department (HOSPITAL_COMMUNITY): Payer: Medicaid Other

## 2020-01-11 ENCOUNTER — Encounter (HOSPITAL_COMMUNITY): Payer: Self-pay

## 2020-01-11 ENCOUNTER — Other Ambulatory Visit: Payer: Self-pay

## 2020-01-11 ENCOUNTER — Inpatient Hospital Stay (HOSPITAL_COMMUNITY)
Admission: EM | Admit: 2020-01-11 | Discharge: 2020-01-14 | DRG: 535 | Disposition: A | Discharge status: Home Health | Payer: Medicaid Other | Attending: Family Medicine | Admitting: Family Medicine

## 2020-01-11 DIAGNOSIS — F418 Other specified anxiety disorders: Secondary | ICD-10-CM | POA: Diagnosis not present

## 2020-01-11 DIAGNOSIS — S72001A Fracture of unspecified part of neck of right femur, initial encounter for closed fracture: Secondary | ICD-10-CM | POA: Diagnosis not present

## 2020-01-11 DIAGNOSIS — G894 Chronic pain syndrome: Secondary | ICD-10-CM | POA: Diagnosis not present

## 2020-01-11 DIAGNOSIS — S32591A Other specified fracture of right pubis, initial encounter for closed fracture: Secondary | ICD-10-CM | POA: Diagnosis not present

## 2020-01-11 DIAGNOSIS — Z981 Arthrodesis status: Secondary | ICD-10-CM

## 2020-01-11 DIAGNOSIS — R42 Dizziness and giddiness: Secondary | ICD-10-CM

## 2020-01-11 DIAGNOSIS — Y92013 Bedroom of single-family (private) house as the place of occurrence of the external cause: Secondary | ICD-10-CM

## 2020-01-11 DIAGNOSIS — D7589 Other specified diseases of blood and blood-forming organs: Secondary | ICD-10-CM | POA: Diagnosis present

## 2020-01-11 DIAGNOSIS — F988 Other specified behavioral and emotional disorders with onset usually occurring in childhood and adolescence: Secondary | ICD-10-CM | POA: Diagnosis present

## 2020-01-11 DIAGNOSIS — Z86718 Personal history of other venous thrombosis and embolism: Secondary | ICD-10-CM

## 2020-01-11 DIAGNOSIS — Z20822 Contact with and (suspected) exposure to covid-19: Secondary | ICD-10-CM | POA: Diagnosis present

## 2020-01-11 DIAGNOSIS — R55 Syncope and collapse: Secondary | ICD-10-CM | POA: Diagnosis present

## 2020-01-11 DIAGNOSIS — I10 Essential (primary) hypertension: Secondary | ICD-10-CM | POA: Diagnosis present

## 2020-01-11 DIAGNOSIS — S32414A Nondisplaced fracture of anterior wall of right acetabulum, initial encounter for closed fracture: Secondary | ICD-10-CM | POA: Diagnosis present

## 2020-01-11 DIAGNOSIS — S32401A Unspecified fracture of right acetabulum, initial encounter for closed fracture: Secondary | ICD-10-CM

## 2020-01-11 DIAGNOSIS — Z833 Family history of diabetes mellitus: Secondary | ICD-10-CM

## 2020-01-11 DIAGNOSIS — Z79899 Other long term (current) drug therapy: Secondary | ICD-10-CM

## 2020-01-11 DIAGNOSIS — R000 Tachycardia, unspecified: Secondary | ICD-10-CM | POA: Diagnosis present

## 2020-01-11 DIAGNOSIS — G8929 Other chronic pain: Secondary | ICD-10-CM | POA: Diagnosis present

## 2020-01-11 DIAGNOSIS — M1990 Unspecified osteoarthritis, unspecified site: Secondary | ICD-10-CM | POA: Diagnosis present

## 2020-01-11 DIAGNOSIS — F329 Major depressive disorder, single episode, unspecified: Secondary | ICD-10-CM | POA: Diagnosis present

## 2020-01-11 DIAGNOSIS — Z87891 Personal history of nicotine dependence: Secondary | ICD-10-CM

## 2020-01-11 DIAGNOSIS — R7989 Other specified abnormal findings of blood chemistry: Secondary | ICD-10-CM | POA: Diagnosis present

## 2020-01-11 DIAGNOSIS — F419 Anxiety disorder, unspecified: Secondary | ICD-10-CM | POA: Diagnosis present

## 2020-01-11 DIAGNOSIS — H81399 Other peripheral vertigo, unspecified ear: Secondary | ICD-10-CM

## 2020-01-11 DIAGNOSIS — F1010 Alcohol abuse, uncomplicated: Secondary | ICD-10-CM | POA: Diagnosis present

## 2020-01-11 DIAGNOSIS — W1830XA Fall on same level, unspecified, initial encounter: Secondary | ICD-10-CM | POA: Diagnosis present

## 2020-01-11 DIAGNOSIS — Z8249 Family history of ischemic heart disease and other diseases of the circulatory system: Secondary | ICD-10-CM

## 2020-01-11 LAB — CBC WITH DIFFERENTIAL/PLATELET
Abs Immature Granulocytes: 0.05 10*3/uL (ref 0.00–0.07)
Basophils Absolute: 0 10*3/uL (ref 0.0–0.1)
Basophils Relative: 0 %
Eosinophils Absolute: 0 10*3/uL (ref 0.0–0.5)
Eosinophils Relative: 0 %
HCT: 39.5 % (ref 39.0–52.0)
Hemoglobin: 13.4 g/dL (ref 13.0–17.0)
Immature Granulocytes: 0 %
Lymphocytes Relative: 10 %
Lymphs Abs: 1.2 10*3/uL (ref 0.7–4.0)
MCH: 34.8 pg — ABNORMAL HIGH (ref 26.0–34.0)
MCHC: 33.9 g/dL (ref 30.0–36.0)
MCV: 102.6 fL — ABNORMAL HIGH (ref 80.0–100.0)
Monocytes Absolute: 1.3 10*3/uL — ABNORMAL HIGH (ref 0.1–1.0)
Monocytes Relative: 10 %
Neutro Abs: 9.6 10*3/uL — ABNORMAL HIGH (ref 1.7–7.7)
Neutrophils Relative %: 80 %
Platelets: 151 10*3/uL (ref 150–400)
RBC: 3.85 MIL/uL — ABNORMAL LOW (ref 4.22–5.81)
RDW: 11.8 % (ref 11.5–15.5)
WBC: 12.1 10*3/uL — ABNORMAL HIGH (ref 4.0–10.5)
nRBC: 0 % (ref 0.0–0.2)

## 2020-01-11 LAB — I-STAT CHEM 8, ED
BUN: 17 mg/dL (ref 6–20)
Calcium, Ion: 1.08 mmol/L — ABNORMAL LOW (ref 1.15–1.40)
Chloride: 97 mmol/L — ABNORMAL LOW (ref 98–111)
Creatinine, Ser: 1 mg/dL (ref 0.61–1.24)
Glucose, Bld: 105 mg/dL — ABNORMAL HIGH (ref 70–99)
HCT: 42 % (ref 39.0–52.0)
Hemoglobin: 14.3 g/dL (ref 13.0–17.0)
Potassium: 3.7 mmol/L (ref 3.5–5.1)
Sodium: 135 mmol/L (ref 135–145)
TCO2: 30 mmol/L (ref 22–32)

## 2020-01-11 LAB — URINALYSIS, ROUTINE W REFLEX MICROSCOPIC
Bacteria, UA: NONE SEEN
Bilirubin Urine: NEGATIVE
Glucose, UA: NEGATIVE mg/dL
Ketones, ur: NEGATIVE mg/dL
Leukocytes,Ua: NEGATIVE
Nitrite: NEGATIVE
Protein, ur: NEGATIVE mg/dL
Specific Gravity, Urine: 1.008 (ref 1.005–1.030)
pH: 7 (ref 5.0–8.0)

## 2020-01-11 LAB — D-DIMER, QUANTITATIVE: D-Dimer, Quant: 2.11 ug/mL-FEU — ABNORMAL HIGH (ref 0.00–0.50)

## 2020-01-11 MED ORDER — IOHEXOL 350 MG/ML SOLN
100.0000 mL | Freq: Once | INTRAVENOUS | Status: AC | PRN
  Administered 2020-01-11: 23:00:00 100 mL via INTRAVENOUS

## 2020-01-11 MED ORDER — ONDANSETRON HCL 4 MG/2ML IJ SOLN
4.0000 mg | Freq: Once | INTRAMUSCULAR | Status: AC
  Administered 2020-01-11: 20:00:00 4 mg via INTRAVENOUS
  Filled 2020-01-11: qty 2

## 2020-01-11 MED ORDER — MORPHINE SULFATE (PF) 4 MG/ML IV SOLN
4.0000 mg | Freq: Once | INTRAVENOUS | Status: AC
  Administered 2020-01-11: 4 mg via INTRAVENOUS
  Filled 2020-01-11: qty 1

## 2020-01-11 MED ORDER — SODIUM CHLORIDE 0.9 % IV BOLUS
1000.0000 mL | Freq: Once | INTRAVENOUS | Status: AC
  Administered 2020-01-11: 20:00:00 1000 mL via INTRAVENOUS

## 2020-01-11 MED ORDER — MECLIZINE HCL 25 MG PO TABS
25.0000 mg | ORAL_TABLET | Freq: Once | ORAL | Status: AC
  Administered 2020-01-11: 20:00:00 25 mg via ORAL
  Filled 2020-01-11: qty 1

## 2020-01-11 NOTE — ED Notes (Signed)
Patient transported to CT 

## 2020-01-11 NOTE — ED Notes (Signed)
Patient transported to MRI 

## 2020-01-11 NOTE — ED Provider Notes (Signed)
[facility]-EMERGENCY DEPT Provider Note   CSN: [id number] Arrival date & time: 01/11/20  1704     History Chief Complaint  Patient presents with  . Dizziness  . Fall  . Hip Pain    Christopher Underwood is a 54 y.o. male.  The history is provided by the patient and medical records. No language interpreter was used.  Dizziness Fall  Hip Pain     54 year old male with history of alcohol abuse, arthritis, HTN, depression presenting for evaluation of dizziness and fall.  Pt report yesterday morning while getting up from bed and walking to his door he felt dizzy with room spinning sensation, and subsequently fell down onto the ground and landed on his R hip.  He then got up again but once again fell to the ground and landed on R hip.  He report a brief syncopal episode, suspected lasting 15 seconds.  Also report seeing flashes of lights to the corner of both eyes along with room spinning sensation throughout the day.  He endorse nausea and multiple episodes of emesis throughout the day and attributing to his dizziness.  C/o throbbing headache, sharp throbbing pain to R hip.  He is unable to walk do to R hip pain.  Pain is shar, shooting from hip down to R thigh.  He feels dehydrated from not eating much throughout the day.  He still endorse mild dizziness.  Denies cp, sob, productive cough, hemoptysis, focal numbness or focal weakness.  No dysuria.  Report prior DVT of LLE last year and was on Eliquis for several month.  DVT of unknown etiology.  Denies any recent alcohol or drug use.  Pain is rated as 8/10. No bowel/bladder incontinence or saddle anesthesia. Report one similar dizziness episode last year, unknown cause.   Past Medical History:  Diagnosis Date  . Arthritis   . Attention deficit disorder   . Depression   . Hypertension     Patient Active Problem List   Diagnosis Date Noted  . Status post lumbar spine operative procedure for decompression of spinal cord  11/16/2017  . 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 Hypokalemia 06/16/2016  . Abdominal pain   . 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 cervical fusion with plating x 2  2000  . left knee arthroscopy  1996   x 2  . lower back surgery  2011  . LUMBAR LAMINECTOMY/DECOMPRESSION MICRODISCECTOMY Left 11/16/2017   Procedure: LUMBAR LAMINECTOMY/DECOMPRESSION MICRODISCECTOMY;  Surgeon: Venita Lick, MD;  Location: [facility] OR;  Service: Orthopedics;  Laterality: Left;  . right arm surgery for donor skin graft  2000  . right foot surgery to clean out arthritis  2011  . right heel reconstruction  2000  . right heel surgery  to remove screw  2000  . right heel surgery for fx  2010  . right heel wound I and D for infection   2011       Family History  Problem Relation Age of Onset  . Diabetes Other   . Hypertension Other   . Heart attack Other     Social History   Tobacco Use  . Smoking status: Former Smoker    Packs/day: 0.25    Years: 1.00    Pack years: 0.25    Types: Cigarettes    Quit date: 10/26/1983    Years since quitting: 36.2  . Smokeless tobacco: Never Used  Substance Use Topics  . Alcohol use: Yes    Comment: every other day-glass of wine  . Drug use: No    Home Medications Prior to Admission medications   Medication Sig Start Date End Date Taking? Authorizing Provider  cholecalciferol (VITAMIN D) 1000 units tablet Take 1,000 Units by mouth daily.     [provider]  lisinopril-hydrochlorothiazide (PRINZIDE,ZESTORETIC) 20-12.5 MG tablet Take 1 tablet by mouth daily.    [provider]  meclizine (ANTIVERT) 25 MG tablet Take 1 tablet (25 mg total) by mouth 3 (three) times daily as needed for dizziness. 07/24/19   Horton, Barbette Hair, MD  methocarbamol (ROBAXIN) 500 MG tablet Take 1 tablet (500 mg total) by mouth 3 (three) times daily. 11/16/17   Mayo, Darla Lesches, PA-C  ondansetron (ZOFRAN ODT) 4 MG disintegrating tablet Take 1 tablet (4 mg total) by mouth every 8 (eight) hours as needed for nausea or vomiting. 07/24/19   Horton, Barbette Hair, MD  POTASSIUM PO Take 1 tablet by mouth daily.    [provider]  sertraline (ZOLOFT) 25 MG tablet Take 25 mg by mouth daily. 08/17/17   [provider]  vitamin C (ASCORBIC ACID) 500 MG tablet Take 500 mg by mouth daily.     [provider]    Allergies    Patient has no known allergies.  Review of Systems   Review of Systems  Neurological: Positive for dizziness.  All other systems reviewed and are negative.   Physical Exam Updated Vital Signs BP (!) 144/91   Pulse (!) 110   Temp 98.6 F (37 C) (Oral)   Resp 18   Ht 5\' 10"  (1.778 m)   Wt 80.7 kg   SpO2 99%   BMI 25.54 kg/m   Physical Exam Vitals and nursing note reviewed.  Constitutional:      General: He is not in acute distress.    Appearance: He is well-developed.  HENT:     Head: Normocephalic and atraumatic.  Eyes:     Extraocular Movements: Extraocular movements intact.     Conjunctiva/sclera: Conjunctivae normal.     Pupils: Pupils are equal, round, and reactive to light.     Comments: No nystagmus.   Cardiovascular:     Rate and Rhythm: Tachycardia present.     Pulses: Normal pulses.     Heart sounds: Normal heart sounds.  Pulmonary:     Effort: Pulmonary effort is normal.     Breath sounds: Normal breath sounds. No wheezing, rhonchi or rales.  Abdominal:     Palpations: Abdomen is soft.     Tenderness: There is no abdominal tenderness.  Musculoskeletal:        General: Tenderness (R hip: tenderness to lateral hip, increasing pain with hip flexion.  no deformity or bruising noted. ) present.     Cervical back: Neck supple.  Skin:    Findings: No rash.  Neurological:     Mental Status: He is alert and oriented to person, place, and time.     GCS: GCS eye subscore is 4. GCS verbal  subscore is 5. GCS motor subscore is 6.     Cranial Nerves: Cranial nerves are intact.     Sensory: Sensation is intact.     Motor: Motor function is intact. No weakness.     Coordination: Coordination is intact.     Comments: Gait not tested due to R hip pain.      ED Results / Procedures /  Treatments   Labs (all labs ordered are listed, but only abnormal results are displayed) Labs Reviewed  CBC WITH DIFFERENTIAL/PLATELET - Abnormal; Notable for the following components:      Result Value   WBC 12.1 (*)    RBC 3.85 (*)    MCV 102.6 (*)    MCH 34.8 (*)    Neutro Abs 9.6 (*)    Monocytes Absolute 1.3 (*)    All other components within normal limits  URINALYSIS, ROUTINE W REFLEX MICROSCOPIC - Abnormal; Notable for the following components:   Hgb urine dipstick SMALL (*)    All other components within normal limits  D-DIMER, QUANTITATIVE (NOT AT [facility]) - Abnormal; Notable for the following components:   D-Dimer, Quant 2.11 (*)    All other components within normal limits  I-STAT CHEM 8, ED - Abnormal; Notable for the following components:   Chloride 97 (*)    Glucose, Bld 105 (*)    Calcium, Ion 1.08 (*)    All other components within normal limits  SARS CORONAVIRUS 2 (TAT 6-24 HRS)    EKG EKG Interpretation  Date/Time:  Friday January 11 2020 20:27:48 EDT Ventricular Rate:  82 PR Interval:    QRS Duration: 80 QT Interval:  372 QTC Calculation: 435 R Axis:   34 Text Interpretation: Sinus rhythm Consider left atrial enlargement Abnormal R-wave progression, early transition No significant change since last tracing Confirmed by Jacalyn Lefevre [phone]) on 01/11/2020 8:31:05 PM   Radiology CT Head Wo Contrast  Result Date: 01/11/2020 CLINICAL DATA:  Posttraumatic headache after fall. EXAM: CT HEAD WITHOUT CONTRAST CT CERVICAL SPINE WITHOUT CONTRAST TECHNIQUE: Multidetector CT imaging of the head and cervical spine was performed following the standard protocol without  intravenous contrast. Multiplanar CT image reconstructions of the cervical spine were also generated. COMPARISON:  June 18, 2016. FINDINGS: CT HEAD FINDINGS Brain: No evidence of acute infarction, hemorrhage, hydrocephalus, extra-axial collection or mass lesion/mass effect. Vascular: No hyperdense vessel or unexpected calcification. Skull: Normal. Negative for fracture or focal lesion. Sinuses/Orbits: No acute finding. Other: None. CT CERVICAL SPINE FINDINGS Alignment: Normal. Skull base and vertebrae: No acute fracture. No primary bone lesion or focal pathologic process. Soft tissues and spinal canal: No prevertebral fluid or swelling. No visible canal hematoma. Disc levels: Status post surgical anterior fusion of C3-4 and C4-5. Severe degenerative disc disease is noted at C5-6 and C6-7 with anterior osteophyte formation. Upper chest: Negative. Other: None. IMPRESSION: 1. Normal head CT. 2. Postsurgical and degenerative changes as described above. No acute abnormality seen in the cervical spine. Electronically Signed   By: Lupita Raider M.D.   On: 01/11/2020 18:41   CT Angio Chest PE W and/or Wo Contrast  Result Date: 01/11/2020 CLINICAL DATA:  Elevated D-dimer. Syncopal episode. Dyspnea. EXAM: CT ANGIOGRAPHY CHEST WITH CONTRAST TECHNIQUE: Multidetector CT imaging of the chest was performed using the standard protocol during bolus administration of intravenous contrast. Multiplanar CT image reconstructions and MIPs were obtained to evaluate the vascular anatomy. Automatic exposure control utilized. CONTRAST:  OMNIPAQUE IOHEXOL 350 MG/ML SOLN COMPARISON:  None. FINDINGS: Cardiovascular: Satisfactory opacification of the pulmonary arteries to the segmental level. No evidence of pulmonary embolism. Normal heart size. No pericardial effusion. Mild LAD calcification. Thoracic aorta calcified atherosclerosis without aneurysmal dilatation. Mediastinum/Nodes: No adenopathy. Normal decompressed esophagus.  Lungs/Pleura: No pneumonia, pulmonary edema, pleural effusion or pneumothorax. Respiratory motion artifact which degrades image quality. Upper Abdomen: Mild hepatomegaly and fatty infiltration of the liver with probable focal  fatty sparing at the gallbladder fossa. A couple small noncalcified stones or sludge in the dependent gallbladder neck with an elongated 11 cm gallbladder length, and borderline 4.2 cm gallbladder dilatation, but without gallbladder wall thickening or para cholecystic fluid or edema. Splenule. 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 Musculoskeletal: Demineralization. Benign left humeral head bone island. Moderate degenerative changes of the lower cervical and lumbar spine. Review of the MIP images confirms the above findings. IMPRESSION: No pulmonary embolism. Mild LAD coronary calcification. A couple small noncalcified stones or sludge in the dependent gallbladder neck with an elongated 11 cm gallbladder length, and borderline 4.2 cm gallbladder dilatation, but without gallbladder wall thickening or paracholecystic fluid or edema. If there is concern for acute or chronic cholecystitis, hepatic biliary scintigraphy could be considered. Aortic Atherosclerosis (ICD10-I70.0). Electronically Signed   By: Laurence Ferrariachel  [place]   On: 01/11/2020 23:09   CT Cervical Spine Wo Contrast  Result Date: 01/11/2020 CLINICAL DATA:  Posttraumatic headache after fall. EXAM: CT HEAD WITHOUT CONTRAST CT CERVICAL SPINE WITHOUT CONTRAST TECHNIQUE: Multidetector CT imaging of the head and cervical spine was performed following the standard protocol without intravenous contrast. Multiplanar CT image reconstructions of the cervical spine were also generated. COMPARISON:  June 18, 2016. FINDINGS: CT HEAD FINDINGS Brain: No evidence of acute infarction, hemorrhage, hydrocephalus, extra-axial collection or mass lesion/mass effect. Vascular: No hyperdense vessel or unexpected calcification. Skull:  Normal. Negative for fracture or focal lesion. Sinuses/Orbits: No acute finding. Other: None. CT CERVICAL SPINE FINDINGS Alignment: Normal. Skull base and vertebrae: No acute fracture. No primary bone lesion or focal pathologic process. Soft tissues and spinal canal: No prevertebral fluid or swelling. No visible canal hematoma. Disc levels: Status post surgical anterior fusion of C3-4 and C4-5. Severe degenerative disc disease is noted at C5-6 and C6-7 with anterior osteophyte formation. Upper chest: Negative. Other: None. IMPRESSION: 1. Normal head CT. 2. Postsurgical and degenerative changes as described above. No acute abnormality seen in the cervical spine. Electronically Signed   By: Lupita RaiderJames  Green Jr M.D.   On: 01/11/2020 18:41   MR BRAIN WO CONTRAST  Result Date: 01/11/2020 CLINICAL DATA:  Dizziness EXAM: MRI HEAD WITHOUT CONTRAST TECHNIQUE: Multiplanar, multiecho pulse sequences of the brain and surrounding structures were obtained without intravenous contrast. COMPARISON:  None. FINDINGS: Brain: There is no acute infarction or intracranial hemorrhage. There is no intracranial mass, mass effect, or edema. There is no hydrocephalus or extra-axial fluid collection. Ventricles and sulci are normal in size and configuration. Minimal small foci of T2 hyperintensity in the supratentorial white matter are nonspecific but may reflect minor chronic microvascular ischemic changes or other etiologies of gliosis/demyelination. Vascular: Major vessel flow voids at the skull base are preserved. Skull and upper cervical spine: Partially imaged anterior few extending inferiorly from C3. Normal marrow signal is preserved. Sinuses/Orbits: Mild mucosal thickening.  Orbits are unremarkable. Other: Sella is unremarkable.  Mastoid air cells are clear. IMPRESSION: No significant abnormality. Electronically Signed   By: Guadlupe SpanishPraneil  Patel M.D.   On: 01/11/2020 21:47   CT Hip Right Wo Contrast  Result Date: 01/11/2020 CLINICAL  DATA:  Pelvic trauma right hip pain after fall EXAM: CT OF THE RIGHT HIP WITHOUT CONTRAST TECHNIQUE: Multidetector CT imaging of the right hip was performed according to the standard protocol. Multiplanar CT image reconstructions were also generated. COMPARISON:  Radiographs 01/11/2020, CT 01/25/2017 FINDINGS: Bones/Joint/Cartilage No dislocation. Acute nondisplaced fracture best seen on coronal and sagittal reconstructions that involves the right superior pubic ramus and  extends to the anterior articular surface of the hip joint and anterior wall of the acetabulum. Small hip effusion is present. Ligaments Suboptimally assessed by CT. Muscles and Tendons No intramuscular fluid collections.  No significant atrophy. Soft tissues Mild soft tissue edema.  Right femoral vascular calcification. IMPRESSION: Acute nondisplaced fracture that involves the right superior pubic ramus and extends to involve the right anterior wall of the acetabulum and anterior articular surface of the hip joint. Electronically Signed   By: Jasmine Pang M.D.   On: 01/11/2020 22:07   DG Hip Unilat W or Wo Pelvis 2-3 Views Right  Result Date: 01/11/2020 CLINICAL DATA:  Pain status post fall.  Right posterior hip pain. EXAM: DG HIP (WITH OR WITHOUT PELVIS) 2-3V RIGHT COMPARISON:  None. FINDINGS: There is mild osteopenia. Mild-to-moderate degenerative changes are noted of both hips. There is no acute displaced fracture or dislocation. IMPRESSION: Negative. Electronically Signed   By: Katherine Mantle M.D.   On: 01/11/2020 18:05    Procedures Procedures (including critical care time)  Medications Ordered in ED Medications  morphine 4 MG/ML injection 4 mg (has no administration in time range)  meclizine (ANTIVERT) tablet 25 mg (25 mg Oral Given 01/11/20 2018)  sodium chloride 0.9 % bolus 1,000 mL (1,000 mLs Intravenous New Bag/Given 01/11/20 2014)  morphine 4 MG/ML injection 4 mg (4 mg Intravenous Given 01/11/20 2015)  ondansetron  (ZOFRAN) injection 4 mg (4 mg Intravenous Given 01/11/20 2015)  iohexol (OMNIPAQUE) 350 MG/ML injection 100 mL (100 mLs Intravenous Contrast Given 01/11/20 2233)    ED Course  I have reviewed the triage vital signs and the nursing notes.  Pertinent labs & imaging results that were available during my care of the patient were reviewed by me and considered in my medical decision making (see chart for details).    MDM Rules/Calculators/A&P                      BP (!) 149/85   Pulse 81   Temp 98.6 F (37 C) (Oral)   Resp 18   Ht 5\' 10"  (1.778 m)   Wt 80.7 kg   SpO2 99%   BMI 25.54 kg/m   Final Clinical Impression(s) / ED Diagnoses Final diagnoses:  Closed right hip fracture, initial encounter (HCC)  Peripheral vertigo, unspecified laterality    Rx / DC Orders ED Discharge Orders    None     Pt report dizziness and fallen twice yesterday morning.  R hip pain, and unable to ambulate due to pain.   xray of R hip unremarkable. Report hitting head and syncope.  Head and cervical spine CT without acute finding.  Dizziness is room spinning sensation.  May consider brain MRI.  Work up initiated.   11:27 PM Since pt was unable to ambulate despite normal R hip xray, CT scan of R hip was obtained and showed acute nondisplaced fx involves the right superior pubic ramus and extends to involve the right anterior wall of the acetabulum and anterior articular surface of the hip joint.  Will consult ortho and will admit pt for further care.   Furthermore, brain MRI obtained to r/o posterior circulation stroke as a potential cause of his vertigo.  MRI of brain is unremarkable.    Due to syncope, and prior hx of DVT I ordered d-dimer which came back elevated at 2.11.  Chest CTA obtained showing no evidence of PE.  Incidentally pt has evidence of cholelithiasis without evidence of  cholecystitis.  No abd pain to suggest gallbladder etiology.  Care discussed with dr. Particia Nearing. Screening covid-19 test  ordered.   11:37 PM Appreciate consultation from oncall orthopedist Dr. Ranell Patrick who will be available for consultation.   12:00 AM I have consulted [facility] Dr. Antionette Char who agrees to see and admit pt for further care.   SKYLIER KRETSCHMER was evaluated in Emergency Department on 01/11/2020 for the symptoms described in the history of present illness. He was evaluated in the context of the global COVID-19 pandemic, which necessitated consideration that the patient might be at risk for infection with the SARS-CoV-2 virus that causes COVID-19. Institutional protocols and algorithms that pertain to the evaluation of patients at risk for COVID-19 are in a state of rapid change based on information released by regulatory bodies including the CDC and federal and state organizations. These policies and algorithms were followed during the patient's care in the ED.    Fayrene Helper, PA-C 01/12/20 0000    Jacalyn Lefevre, MD 01/12/20 [phone]

## 2020-01-11 NOTE — ED Notes (Signed)
This writer attempted orthostatic vital signs on patient. After explaining how I would be obtaining vital signs, patient stated "I feel extremely dizzy".  Told patient that it I would attempt orthostatic vital signs at a later time.

## 2020-01-11 NOTE — ED Triage Notes (Addendum)
Pt arrived to the ED with c/o of dizziness, fall, right hip pain and headache. Pt reports yesterday morning he woke up, started ambulating to the bathroom, suddenly his vision became blurry and pt passed out and fell onto right hip. Pt stated he knows he stood up and fell again and believes he hit his head on the door frame.   Pt reports that his vision is back to normal and has just experience nausea and vomiting since the event. Pt also reports he is unable to walk due to pain on his right hip. Pt reports he is more comfortable in the sitting position. Pt is not on blood thinners, however, was on Eliquis in August for a DVT. Pt is A&ox4.

## 2020-01-12 ENCOUNTER — Inpatient Hospital Stay (HOSPITAL_COMMUNITY): Payer: Medicaid Other

## 2020-01-12 ENCOUNTER — Encounter (HOSPITAL_COMMUNITY): Payer: Self-pay | Admitting: Family Medicine

## 2020-01-12 DIAGNOSIS — S32591A Other specified fracture of right pubis, initial encounter for closed fracture: Secondary | ICD-10-CM | POA: Diagnosis present

## 2020-01-12 DIAGNOSIS — I10 Essential (primary) hypertension: Secondary | ICD-10-CM | POA: Diagnosis present

## 2020-01-12 DIAGNOSIS — R7989 Other specified abnormal findings of blood chemistry: Secondary | ICD-10-CM

## 2020-01-12 DIAGNOSIS — M7989 Other specified soft tissue disorders: Secondary | ICD-10-CM | POA: Diagnosis not present

## 2020-01-12 DIAGNOSIS — M1990 Unspecified osteoarthritis, unspecified site: Secondary | ICD-10-CM | POA: Diagnosis present

## 2020-01-12 DIAGNOSIS — H81399 Other peripheral vertigo, unspecified ear: Secondary | ICD-10-CM | POA: Diagnosis present

## 2020-01-12 DIAGNOSIS — S72001A Fracture of unspecified part of neck of right femur, initial encounter for closed fracture: Secondary | ICD-10-CM | POA: Diagnosis not present

## 2020-01-12 DIAGNOSIS — Z833 Family history of diabetes mellitus: Secondary | ICD-10-CM | POA: Diagnosis not present

## 2020-01-12 DIAGNOSIS — Z8249 Family history of ischemic heart disease and other diseases of the circulatory system: Secondary | ICD-10-CM | POA: Diagnosis not present

## 2020-01-12 DIAGNOSIS — S32414A Nondisplaced fracture of anterior wall of right acetabulum, initial encounter for closed fracture: Secondary | ICD-10-CM

## 2020-01-12 DIAGNOSIS — G8929 Other chronic pain: Secondary | ICD-10-CM | POA: Diagnosis present

## 2020-01-12 DIAGNOSIS — D7589 Other specified diseases of blood and blood-forming organs: Secondary | ICD-10-CM | POA: Diagnosis present

## 2020-01-12 DIAGNOSIS — F419 Anxiety disorder, unspecified: Secondary | ICD-10-CM | POA: Diagnosis present

## 2020-01-12 DIAGNOSIS — F1010 Alcohol abuse, uncomplicated: Secondary | ICD-10-CM | POA: Diagnosis present

## 2020-01-12 DIAGNOSIS — Z79899 Other long term (current) drug therapy: Secondary | ICD-10-CM | POA: Diagnosis not present

## 2020-01-12 DIAGNOSIS — Z86718 Personal history of other venous thrombosis and embolism: Secondary | ICD-10-CM | POA: Diagnosis not present

## 2020-01-12 DIAGNOSIS — R42 Dizziness and giddiness: Secondary | ICD-10-CM

## 2020-01-12 DIAGNOSIS — W1830XA Fall on same level, unspecified, initial encounter: Secondary | ICD-10-CM | POA: Diagnosis present

## 2020-01-12 DIAGNOSIS — Z87891 Personal history of nicotine dependence: Secondary | ICD-10-CM | POA: Diagnosis not present

## 2020-01-12 DIAGNOSIS — Y92013 Bedroom of single-family (private) house as the place of occurrence of the external cause: Secondary | ICD-10-CM | POA: Diagnosis not present

## 2020-01-12 DIAGNOSIS — Z981 Arthrodesis status: Secondary | ICD-10-CM | POA: Diagnosis not present

## 2020-01-12 DIAGNOSIS — Z20822 Contact with and (suspected) exposure to covid-19: Secondary | ICD-10-CM | POA: Diagnosis present

## 2020-01-12 DIAGNOSIS — F988 Other specified behavioral and emotional disorders with onset usually occurring in childhood and adolescence: Secondary | ICD-10-CM | POA: Diagnosis present

## 2020-01-12 DIAGNOSIS — F329 Major depressive disorder, single episode, unspecified: Secondary | ICD-10-CM | POA: Diagnosis present

## 2020-01-12 DIAGNOSIS — R000 Tachycardia, unspecified: Secondary | ICD-10-CM | POA: Diagnosis present

## 2020-01-12 DIAGNOSIS — R55 Syncope and collapse: Secondary | ICD-10-CM | POA: Diagnosis present

## 2020-01-12 LAB — SARS CORONAVIRUS 2 (TAT 6-24 HRS): SARS Coronavirus 2: NEGATIVE

## 2020-01-12 LAB — BASIC METABOLIC PANEL
Anion gap: 10 (ref 5–15)
BUN: 13 mg/dL (ref 6–20)
CO2: 28 mmol/L (ref 22–32)
Calcium: 8.3 mg/dL — ABNORMAL LOW (ref 8.9–10.3)
Chloride: 99 mmol/L (ref 98–111)
Creatinine, Ser: 0.99 mg/dL (ref 0.61–1.24)
GFR calc Af Amer: >60 mL/min (ref >60)
GFR calc non Af Amer: >60 mL/min (ref >60)
Glucose, Bld: 94 mg/dL (ref 70–99)
Potassium: 3.5 mmol/L (ref 3.5–5.1)
Sodium: 137 mmol/L (ref 135–145)

## 2020-01-12 LAB — CBC
HCT: 36.6 % — ABNORMAL LOW (ref 39.0–52.0)
Hemoglobin: 12 g/dL — ABNORMAL LOW (ref 13.0–17.0)
MCH: 34.9 pg — ABNORMAL HIGH (ref 26.0–34.0)
MCHC: 32.8 g/dL (ref 30.0–36.0)
MCV: 106.4 fL — ABNORMAL HIGH (ref 80.0–100.0)
Platelets: 147 10*3/uL — ABNORMAL LOW (ref 150–400)
RBC: 3.44 MIL/uL — ABNORMAL LOW (ref 4.22–5.81)
RDW: 12 % (ref 11.5–15.5)
WBC: 8.5 10*3/uL (ref 4.0–10.5)
nRBC: 0 % (ref 0.0–0.2)

## 2020-01-12 LAB — HIV ANTIBODY (ROUTINE TESTING W REFLEX): HIV Screen 4th Generation wRfx: NONREACTIVE

## 2020-01-12 MED ORDER — PANTOPRAZOLE SODIUM 40 MG PO TBEC
40.0000 mg | DELAYED_RELEASE_TABLET | Freq: Every day | ORAL | Status: DC
  Administered 2020-01-12 – 2020-01-14 (×3): 40 mg via ORAL
  Filled 2020-01-12 (×3): qty 1

## 2020-01-12 MED ORDER — OXYCODONE-ACETAMINOPHEN 10-325 MG PO TABS: 1.0000 | ORAL_TABLET | Freq: Four times a day (QID) | ORAL | Status: DC

## 2020-01-12 MED ORDER — SODIUM CHLORIDE 0.9% FLUSH
3.0000 mL | Freq: Two times a day (BID) | INTRAVENOUS | Status: DC
  Administered 2020-01-13 – 2020-01-14 (×2): 3 mL via INTRAVENOUS

## 2020-01-12 MED ORDER — ENOXAPARIN SODIUM 40 MG/0.4ML ~~LOC~~ SOLN
40.0000 mg | Freq: Every day | SUBCUTANEOUS | Status: DC
  Administered 2020-01-12 – 2020-01-14 (×3): 40 mg via SUBCUTANEOUS
  Filled 2020-01-12 (×3): qty 0.4

## 2020-01-12 MED ORDER — BISACODYL 5 MG PO TBEC: 5.0000 mg | DELAYED_RELEASE_TABLET | Freq: Every day | ORAL | Status: DC | PRN

## 2020-01-12 MED ORDER — ACETAMINOPHEN 650 MG RE SUPP: 650.0000 mg | Freq: Four times a day (QID) | RECTAL | Status: DC | PRN

## 2020-01-12 MED ORDER — POLYETHYLENE GLYCOL 3350 17 G PO PACK: 17.0000 g | PACK | Freq: Every day | ORAL | Status: DC | PRN

## 2020-01-12 MED ORDER — HYDROCHLOROTHIAZIDE 12.5 MG PO CAPS
12.5000 mg | ORAL_CAPSULE | Freq: Every day | ORAL | Status: DC
  Administered 2020-01-12 – 2020-01-14 (×3): 12.5 mg via ORAL
  Filled 2020-01-12 (×3): qty 1

## 2020-01-12 MED ORDER — BUPRENORPHINE HCL 450 MCG BU FILM: 450.0000 ug | ORAL_FILM | Freq: Two times a day (BID) | BUCCAL | Status: DC

## 2020-01-12 MED ORDER — DICLOFENAC SODIUM 50 MG PO TBEC
50.0000 mg | DELAYED_RELEASE_TABLET | Freq: Two times a day (BID) | ORAL | Status: DC | PRN
  Filled 2020-01-12: qty 1

## 2020-01-12 MED ORDER — ONDANSETRON HCL 4 MG PO TABS: 4.0000 mg | ORAL_TABLET | Freq: Four times a day (QID) | ORAL | Status: DC | PRN

## 2020-01-12 MED ORDER — METHOCARBAMOL 500 MG PO TABS: 500.0000 mg | ORAL_TABLET | Freq: Three times a day (TID) | ORAL | Status: DC | PRN

## 2020-01-12 MED ORDER — MORPHINE SULFATE (PF) 4 MG/ML IV SOLN
4.0000 mg | INTRAVENOUS | Status: DC | PRN
  Administered 2020-01-12 – 2020-01-14 (×9): 4 mg via INTRAVENOUS
  Filled 2020-01-12 (×9): qty 1

## 2020-01-12 MED ORDER — VILAZODONE HCL 20 MG PO TABS
20.0000 mg | ORAL_TABLET | Freq: Every day | ORAL | Status: DC
  Administered 2020-01-12 – 2020-01-13 (×2): 20 mg via ORAL
  Filled 2020-01-12 (×4): qty 1

## 2020-01-12 MED ORDER — OXYCODONE HCL 5 MG PO TABS
5.0000 mg | ORAL_TABLET | Freq: Three times a day (TID) | ORAL | Status: DC
  Administered 2020-01-12 – 2020-01-14 (×9): 5 mg via ORAL
  Filled 2020-01-12 (×9): qty 1

## 2020-01-12 MED ORDER — CLONAZEPAM 0.5 MG PO TABS
0.5000 mg | ORAL_TABLET | Freq: Two times a day (BID) | ORAL | Status: DC | PRN
  Administered 2020-01-12 – 2020-01-13 (×5): 0.5 mg via ORAL
  Filled 2020-01-12 (×5): qty 1

## 2020-01-12 MED ORDER — ONDANSETRON HCL 4 MG/2ML IJ SOLN
4.0000 mg | Freq: Four times a day (QID) | INTRAMUSCULAR | Status: DC | PRN
  Administered 2020-01-12: 03:00:00 4 mg via INTRAVENOUS
  Filled 2020-01-12: qty 2

## 2020-01-12 MED ORDER — ACETAMINOPHEN 325 MG PO TABS: 650.0000 mg | ORAL_TABLET | Freq: Four times a day (QID) | ORAL | Status: DC | PRN

## 2020-01-12 MED ORDER — SODIUM CHLORIDE 0.9 % IV SOLN: INTRAVENOUS | Status: AC

## 2020-01-12 MED ORDER — LISINOPRIL 20 MG PO TABS
20.0000 mg | ORAL_TABLET | Freq: Every day | ORAL | Status: DC
  Administered 2020-01-12 – 2020-01-14 (×3): 20 mg via ORAL
  Filled 2020-01-12 (×3): qty 1

## 2020-01-12 MED ORDER — LISINOPRIL-HYDROCHLOROTHIAZIDE 20-12.5 MG PO TABS: 1.0000 | ORAL_TABLET | Freq: Every day | ORAL | Status: DC

## 2020-01-12 MED ORDER — OXYCODONE-ACETAMINOPHEN 5-325 MG PO TABS
1.0000 | ORAL_TABLET | Freq: Three times a day (TID) | ORAL | Status: DC
  Administered 2020-01-12 – 2020-01-14 (×9): 1 via ORAL
  Filled 2020-01-12 (×9): qty 1

## 2020-01-12 MED ORDER — MECLIZINE HCL 25 MG PO TABS
25.0000 mg | ORAL_TABLET | Freq: Three times a day (TID) | ORAL | Status: DC | PRN
  Administered 2020-01-12 – 2020-01-14 (×3): 25 mg via ORAL
  Filled 2020-01-12 (×3): qty 1

## 2020-01-12 NOTE — H&P (Signed)
History and Physical    RANULFO KALL EHM:[id number] DOB: January 09, 1966 DOA: 01/11/2020  PCP: Katherina Mires, MD   Patient coming from: Home   Chief Complaint: Vertigo, fall with severe right hip pain   HPI: Christopher Underwood is a 54 y.o. 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  Patient reports that he developed recurrent vertigo symptoms yesterday morning, suffered a fall secondary to this, and has been experiencing severe right hip pain since then.  He took some meclizine at home with improvement in his vertigo, has been able to bear weight, but is unable to ambulate due to severe pain at the right hip.  He denies any recent fevers, chills, chest pain, cough, shortness of breath, or leg swelling or tenderness.  ED Course: Upon arrival to the ED, patient is found to be afebrile, saturating 100% on room air, slightly tachycardic, and with stable blood pressure.  EKG features sinus rhythm with normal rate.  Chemistry panel unremarkable and CBC features a macrocytosis without anemia.  D-dimer is elevated to 2.11.  Chest x-ray and radiographs of the right hip are negative.  No acute abnormality on cervical spine CT and head CT is normal.  CT of the right hip is concerning for acute nondisplaced fracture of the right superior pubic ramus involving the right anterior wall of the acetabulum.  CTA chest is negative for PE.  Patient was given a liter of saline, meclizine, morphine, and Zofran in the emergency department.  Orthopedic surgery was consulted by the ED physician.  Review of Systems:  All other systems reviewed and apart from HPI, are negative.  Past Medical History:  Diagnosis Date   Arthritis    Attention deficit disorder    Depression    Hypertension     Past Surgical History:  Procedure Laterality Date   cervical fusion with plating x 2   2000   left knee arthroscopy  1996   x 2   lower back surgery  2011   LUMBAR LAMINECTOMY/DECOMPRESSION MICRODISCECTOMY Left 11/16/2017   Procedure: LUMBAR LAMINECTOMY/DECOMPRESSION MICRODISCECTOMY;  Surgeon: Melina Schools, MD;  Location: [facility];  Service: Orthopedics;  Laterality: Left;   right arm surgery for donor skin graft  2000   right foot surgery to clean out arthritis  2011   right heel reconstruction  2000   right heel surgery  to remove screw  2000   right heel surgery for fx  2010   right heel wound I and D for infection   2011     reports that he quit smoking about 36 years ago. His smoking use included cigarettes. He has a 0.25 pack-year smoking history. He has never used smokeless tobacco. He reports current alcohol use. He reports that he does not use drugs.  No Known Allergies  Family History  Problem Relation Age of Onset   Diabetes Other    Hypertension Other    Heart attack Other      Prior to Admission medications   Medication Sig Start Date End Date Taking? Authorizing Provider  ADDERALL XR 20 MG 24 hr capsule Take 20 mg by mouth every morning. 12/21/19  Yes [provider]  BELBUCA 450 [place] 450 mg under the tongue 2 (two) times daily. 12/25/19  Yes [provider]  cholecalciferol (VITAMIN D) 1000 units tablet Take 1,000 Units by mouth daily.  Yes [provider]  clonazePAM (KLONOPIN) 0.5 MG tablet Take 0.5 mg by mouth 2 (two) times daily as needed for anxiety. 12/21/19  Yes [provider]  hydrOXYzine (VISTARIL) 25 MG capsule Take 25 mg by mouth 2 (two) times daily as needed. 11/18/19  Yes [provider]  lisinopril-hydrochlorothiazide (PRINZIDE,ZESTORETIC) 20-12.5 MG tablet Take 1 tablet by mouth daily.   Yes [provider]  meclizine (ANTIVERT) 25 MG tablet Take 1 tablet (25 mg total) by mouth 3 (three) times daily as needed for dizziness. 07/24/19  Yes Horton, Mayer Masker, MD    Multiple Vitamin (MULTIVITAMIN WITH MINERALS) TABS tablet Take 1 tablet by mouth daily.   Yes [provider]  omeprazole (PRILOSEC) 20 MG capsule Take 20 mg by mouth daily. 11/26/19  Yes [provider]  ondansetron (ZOFRAN ODT) 4 MG disintegrating tablet Take 1 tablet (4 mg total) by mouth every 8 (eight) hours as needed for nausea or vomiting. 07/24/19  Yes Horton, Mayer Masker, MD  oxyCODONE-acetaminophen (PERCOCET) 10-325 MG tablet Take 1 tablet by mouth 4 (four) times daily. 12/25/19  Yes [provider]  VIIBRYD 20 MG TABS Take 20 tablets by mouth daily. 11/22/19  Yes [provider]  methocarbamol (ROBAXIN) 500 MG tablet Take 1 tablet (500 mg total) by mouth 3 (three) times daily. Patient not taking: Reported on 01/11/2020 11/16/17   Kirt Boys, [place]    Physical Exam: Vitals:   01/11/20 2330 01/12/20 0030 01/12/20 0100 01/12/20 0150  BP: (!) 140/93 (!) 149/97 (!) 141/90 (!) 158/91  Pulse: 72 68 68   Resp:   17 12  Temp:    98.1 F (36.7 C)  TempSrc:    Oral  SpO2: 100% 100% 100% 100%  Weight:      Height:        Constitutional: NAD, calm  Eyes: PERTLA, lids and conjunctivae normal ENMT: Mucous membranes are moist. Posterior pharynx clear of any exudate or lesions.   Neck: normal, supple, no masses, no thyromegaly Respiratory: clear to auscultation bilaterally, no wheezing, no crackles. No accessory muscle use.  Cardiovascular: S1 & S2 heard, regular rate and rhythm. No extremity edema.   Abdomen: No distension, no tenderness, soft. Bowel sounds active.  Musculoskeletal: no clubbing / cyanosis. Right hip tender with palpation or ROM.   Skin: no significant rashes, lesions, ulcers. Warm, dry, well-perfused. Neurologic: CN 2-12 grossly intact. Sensation intact, DTR normal. Strength 5/5 in all 4 limbs.  Psychiatric: Alert and oriented x 3. Pleasant and cooperative.    Labs and Imaging on Admission: I have personally reviewed following  labs and imaging studies  CBC: Recent Labs  Lab 01/11/20 2010 01/11/20 2036  WBC 12.1*  --   NEUTROABS 9.6*  --   HGB 13.4 14.3  HCT 39.5 42.0  MCV 102.6*  --   PLT 151  --    Basic Metabolic Panel: Recent Labs  Lab 01/11/20 2036  NA 135  K 3.7  CL 97*  GLUCOSE 105*  BUN 17  CREATININE 1.00   GFR: Estimated Creatinine Clearance: 88.2 mL/min (by C-G formula based on SCr of 1 mg/dL). Liver Function Tests: No results for input(s): AST, ALT, ALKPHOS, BILITOT, PROT, ALBUMIN in the last 168 hours. No results for input(s): LIPASE, AMYLASE in the last 168 hours. No results for input(s): AMMONIA in the last 168 hours. Coagulation Profile: No results for input(s): INR, PROTIME in the last 168 hours. Cardiac Enzymes: No results for input(s): CKTOTAL, CKMB, CKMBINDEX,  TROPONINI in the last 168 hours. BNP (last 3 results) No results for input(s): PROBNP in the last 8760 hours. HbA1C: No results for input(s): HGBA1C in the last 72 hours. CBG: No results for input(s): GLUCAP in the last 168 hours. Lipid Profile: No results for input(s): CHOL, HDL, LDLCALC, TRIG, CHOLHDL, LDLDIRECT in the last 72 hours. Thyroid Function Tests: No results for input(s): TSH, T4TOTAL, FREET4, T3FREE, THYROIDAB in the last 72 hours. Anemia Panel: No results for input(s): VITAMINB12, FOLATE, FERRITIN, TIBC, IRON, RETICCTPCT in the last 72 hours. Urine analysis:    Component Value Date/Time   COLORURINE YELLOW 01/11/2020 2010   APPEARANCEUR CLEAR 01/11/2020 2010   LABSPEC 1.008 01/11/2020 2010   PHURINE 7.0 01/11/2020 2010   GLUCOSEU NEGATIVE 01/11/2020 2010   HGBUR SMALL (A) 01/11/2020 2010   BILIRUBINUR NEGATIVE 01/11/2020 2010   KETONESUR NEGATIVE 01/11/2020 2010   PROTEINUR NEGATIVE 01/11/2020 2010   UROBILINOGEN 0.2 05/30/2012 1350   NITRITE NEGATIVE 01/11/2020 2010   LEUKOCYTESUR NEGATIVE 01/11/2020 2010   Sepsis Labs: @LABRCNTIP (procalcitonin:4,lacticidven:4) )No results found for  this or any previous visit (from the past 240 hour(s)).   Radiological Exams on Admission: CT Head Wo Contrast  Result Date: 01/11/2020 CLINICAL DATA:  Posttraumatic headache after fall. EXAM: CT HEAD WITHOUT CONTRAST CT CERVICAL SPINE WITHOUT CONTRAST TECHNIQUE: Multidetector CT imaging of the head and cervical spine was performed following the standard protocol without intravenous contrast. Multiplanar CT image reconstructions of the cervical spine were also generated. COMPARISON:  June 18, 2016. FINDINGS: CT HEAD FINDINGS Brain: No evidence of acute infarction, hemorrhage, hydrocephalus, extra-axial collection or mass lesion/mass effect. Vascular: No hyperdense vessel or unexpected calcification. Skull: Normal. Negative for fracture or focal lesion. Sinuses/Orbits: No acute finding. Other: None. CT CERVICAL SPINE FINDINGS Alignment: Normal. Skull base and vertebrae: No acute fracture. No primary bone lesion or focal pathologic process. Soft tissues and spinal canal: No prevertebral fluid or swelling. No visible canal hematoma. Disc levels: Status post surgical anterior fusion of C3-4 and C4-5. Severe degenerative disc disease is noted at C5-6 and C6-7 with anterior osteophyte formation. Upper chest: Negative. Other: None. IMPRESSION: 1. Normal head CT. 2. Postsurgical and degenerative changes as described above. No acute abnormality seen in the cervical spine. Electronically Signed   By: June 20, 2016 M.D.   On: 01/11/2020 18:41   CT Angio Chest PE W and/or Wo Contrast  Result Date: 01/11/2020 CLINICAL DATA:  Elevated D-dimer. Syncopal episode. Dyspnea. EXAM: CT ANGIOGRAPHY CHEST WITH CONTRAST TECHNIQUE: Multidetector CT imaging of the chest was performed using the standard protocol during bolus administration of intravenous contrast. Multiplanar CT image reconstructions and MIPs were obtained to evaluate the vascular anatomy. Automatic exposure control utilized. CONTRAST:  01/13/2020 OMNIPAQUE IOHEXOL  350 MG/ML SOLN COMPARISON:  None. FINDINGS: Cardiovascular: Satisfactory opacification of the pulmonary arteries to the segmental level. No evidence of pulmonary embolism. Normal heart size. No pericardial effusion. Mild LAD calcification. Thoracic aorta calcified atherosclerosis without aneurysmal dilatation. Mediastinum/Nodes: No adenopathy. Normal decompressed esophagus. Lungs/Pleura: No pneumonia, pulmonary edema, pleural effusion or pneumothorax. Respiratory motion artifact which degrades image quality. Upper Abdomen: Mild hepatomegaly and fatty infiltration of the liver with probable focal fatty sparing at the gallbladder fossa. A couple small noncalcified stones or sludge in the dependent gallbladder neck with an elongated 11 cm gallbladder length, and borderline 4.2 cm gallbladder dilatation, but without gallbladder wall thickening or para cholecystic fluid or edema. Splenule. 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 Musculoskeletal: Demineralization. Benign left humeral head  bone island. Moderate degenerative changes of the lower cervical and lumbar spine. Review of the MIP images confirms the above findings. IMPRESSION: No pulmonary embolism. Mild LAD coronary calcification. A couple small noncalcified stones or sludge in the dependent gallbladder neck with an elongated 11 cm gallbladder length, and borderline 4.2 cm gallbladder dilatation, but without gallbladder wall thickening or paracholecystic fluid or edema. If there is concern for acute or chronic cholecystitis, hepatic biliary scintigraphy could be considered. Aortic Atherosclerosis (ICD10-I70.0). Electronically Signed   By: Laurence Ferrari   On: 01/11/2020 23:09   CT Cervical Spine Wo Contrast  Result Date: 01/11/2020 CLINICAL DATA:  Posttraumatic headache after fall. EXAM: CT HEAD WITHOUT CONTRAST CT CERVICAL SPINE WITHOUT CONTRAST TECHNIQUE: Multidetector CT imaging of the head and cervical spine was performed following  the standard protocol without intravenous contrast. Multiplanar CT image reconstructions of the cervical spine were also generated. COMPARISON:  June 18, 2016. FINDINGS: CT HEAD FINDINGS Brain: No evidence of acute infarction, hemorrhage, hydrocephalus, extra-axial collection or mass lesion/mass effect. Vascular: No hyperdense vessel or unexpected calcification. Skull: Normal. Negative for fracture or focal lesion. Sinuses/Orbits: No acute finding. Other: None. CT CERVICAL SPINE FINDINGS Alignment: Normal. Skull base and vertebrae: No acute fracture. No primary bone lesion or focal pathologic process. Soft tissues and spinal canal: No prevertebral fluid or swelling. No visible canal hematoma. Disc levels: Status post surgical anterior fusion of C3-4 and C4-5. Severe degenerative disc disease is noted at C5-6 and C6-7 with anterior osteophyte formation. Upper chest: Negative. Other: None. IMPRESSION: 1. Normal head CT. 2. Postsurgical and degenerative changes as described above. No acute abnormality seen in the cervical spine. Electronically Signed   By: Lupita Raider M.D.   On: 01/11/2020 18:41   MR BRAIN WO CONTRAST  Result Date: 01/11/2020 CLINICAL DATA:  Dizziness EXAM: MRI HEAD WITHOUT CONTRAST TECHNIQUE: Multiplanar, multiecho pulse sequences of the brain and surrounding structures were obtained without intravenous contrast. COMPARISON:  None. FINDINGS: Brain: There is no acute infarction or intracranial hemorrhage. There is no intracranial mass, mass effect, or edema. There is no hydrocephalus or extra-axial fluid collection. Ventricles and sulci are normal in size and configuration. Minimal small foci of T2 hyperintensity in the supratentorial white matter are nonspecific but may reflect minor chronic microvascular ischemic changes or other etiologies of gliosis/demyelination. Vascular: Major vessel flow voids at the skull base are preserved. Skull and upper cervical spine: Partially imaged anterior  few extending inferiorly from C3. Normal marrow signal is preserved. Sinuses/Orbits: Mild mucosal thickening.  Orbits are unremarkable. Other: Sella is unremarkable.  Mastoid air cells are clear. IMPRESSION: No significant abnormality. Electronically Signed   By: Guadlupe Spanish M.D.   On: 01/11/2020 21:47   CT Hip Right Wo Contrast  Result Date: 01/11/2020 CLINICAL DATA:  Pelvic trauma right hip pain after fall EXAM: CT OF THE RIGHT HIP WITHOUT CONTRAST TECHNIQUE: Multidetector CT imaging of the right hip was performed according to the standard protocol. Multiplanar CT image reconstructions were also generated. COMPARISON:  Radiographs 01/11/2020, CT 01/25/2017 FINDINGS: Bones/Joint/Cartilage No dislocation. Acute nondisplaced fracture best seen on coronal and sagittal reconstructions that involves the right superior pubic ramus and extends to the anterior articular surface of the hip joint and anterior wall of the acetabulum. Small hip effusion is present. Ligaments Suboptimally assessed by CT. Muscles and Tendons No intramuscular fluid collections.  No significant atrophy. Soft tissues Mild soft tissue edema.  Right femoral vascular calcification. IMPRESSION: Acute nondisplaced fracture that involves the right  superior pubic ramus and extends to involve the right anterior wall of the acetabulum and anterior articular surface of the hip joint. Electronically Signed   By: Jasmine PangKim  Fujinaga M.D.   On: 01/11/2020 22:07   DG Hip Unilat W or Wo Pelvis 2-3 Views Right  Result Date: 01/11/2020 CLINICAL DATA:  Pain status post fall.  Right posterior hip pain. EXAM: DG HIP (WITH OR WITHOUT PELVIS) 2-3V RIGHT COMPARISON:  None. FINDINGS: There is mild osteopenia. Mild-to-moderate degenerative changes are noted of both hips. There is no acute displaced fracture or dislocation. IMPRESSION: Negative. Electronically Signed   By: Katherine Mantlehristopher  Green M.D.   On: 01/11/2020 18:05    EKG: Independently reviewed. Sinus rhythm.     Assessment/Plan   1. Right pubic ramus and acetabular fracture  - Patient developed recurrent vertigo, fell due to this, presents with severe right hip pain, and is found to have right pubic ramus fracture that extends to involve acetabulum   - Appreciate orthopedic surgery, recommending non-wt bearing for 6 wks followed by therapy  - Continue pain-control, non-wt bearing    2. Vertigo  - Presents with recurrent vertigo that led to fall with hip injury  - MRI negative for CVA, sxs improved with meclizine    3. Hypertension  - BP at goal  - Continue lisinopril-HCTZ    4. Chronic pain  - Continue home regimen, will add short course IV analgesic for acute fracture    5. Depression, anxiety  - Continue vilazodone and as-needed Klonopin    DVT prophylaxis: Lovenox  Code Status: Full  Family Communication: Discussed with patient  Disposition Plan: Likely back home in 24-48 hours once pain is controlled on oral medications  Consults called: Orthopedic surgery  Admission status: Observation     Briscoe Deutscherimothy S Quinterius Gaida, MD [facility] Pager: See [email or web address]  If 7AM-7PM, please contact the daytime attending [email or web address]  01/12/2020, 2:30 AM

## 2020-01-12 NOTE — Consult Note (Signed)
Reason for Consult: right acetabular fracture Referring Physician: EDP  Christopher Underwood is an 54 y.o. male.  HPI: 54 yo male who reports injuring his right hip when he became dizzy and fell going to the bathroom. He complains of right hip pain and is unable to ambulate due to hip pain. No other complaints.   Past Medical History:  Diagnosis Date  . Arthritis   . Attention deficit disorder   . Depression   . Hypertension     Past Surgical History:  Procedure Laterality Date  . cervical fusion with plating x 2  2000  . left knee arthroscopy  1996   x 2  . lower back surgery  2011  . LUMBAR LAMINECTOMY/DECOMPRESSION MICRODISCECTOMY Left 11/16/2017   Procedure: LUMBAR LAMINECTOMY/DECOMPRESSION MICRODISCECTOMY;  Surgeon: Venita Lick, MD;  Location: [facility] OR;  Service: Orthopedics;  Laterality: Left;  . right arm surgery for donor skin graft  2000  . right foot surgery to clean out arthritis  2011  . right heel reconstruction  2000  . right heel surgery  to remove screw  2000  . right heel surgery for fx  2010  . right heel wound I and D for infection   2011    Family History  Problem Relation Age of Onset  . Diabetes Other   . Hypertension Other   . Heart attack Other     Social History:  reports that he quit smoking about 36 years ago. His smoking use included cigarettes. He has a 0.25 pack-year smoking history. He has never used smokeless tobacco. He reports current alcohol use. He reports that he does not use drugs.  Allergies: No Known Allergies  Medications: I have reviewed the patient's current medications.  Results for orders placed or performed during the hospital encounter of 01/11/20 (from the past 48 hour(s))  CBC with Differential     Status: Abnormal   Collection Time: 01/11/20  8:10 PM  Result Value Ref Range   WBC 12.1 (H) 4.0 - 10.5 K/uL   RBC 3.85 (L) 4.22 - 5.81 MIL/uL   Hemoglobin 13.4 13.0 - 17.0 g/dL   HCT 16.1 09.6 - 04.5 %   MCV 102.6 (H) 80.0 - 100.0  fL   MCH 34.8 (H) 26.0 - 34.0 pg   MCHC 33.9 30.0 - 36.0 g/dL   RDW 40.9 81.1 - 91.4 %   Platelets 151 150 - 400 K/uL   nRBC 0.0 0.0 - 0.2 %   Neutrophils Relative % 80 %   Neutro Abs 9.6 (H) 1.7 - 7.7 K/uL   Lymphocytes Relative 10 %   Lymphs Abs 1.2 0.7 - 4.0 K/uL   Monocytes Relative 10 %   Monocytes Absolute 1.3 (H) 0.1 - 1.0 K/uL   Eosinophils Relative 0 %   Eosinophils Absolute 0.0 0.0 - 0.5 K/uL   Basophils Relative 0 %   Basophils Absolute 0.0 0.0 - 0.1 K/uL   Immature Granulocytes 0 %   Abs Immature Granulocytes 0.05 0.00 - 0.07 K/uL    Comment: Performed at [facility], [address] [place], [place] [address]  Urinalysis, Routine w reflex microscopic     Status: Abnormal   Collection Time: 01/11/20  8:10 PM  Result Value Ref Range   Color, Urine YELLOW YELLOW   APPearance CLEAR CLEAR   Specific Gravity, Urine 1.008 1.005 - 1.030   pH 7.0 5.0 - 8.0   Glucose, UA NEGATIVE NEGATIVE mg/dL   Hgb urine dipstick SMALL (  A) NEGATIVE   Bilirubin Urine NEGATIVE NEGATIVE   Ketones, ur NEGATIVE NEGATIVE mg/dL   Protein, ur NEGATIVE NEGATIVE mg/dL   Nitrite NEGATIVE NEGATIVE   Leukocytes,Ua NEGATIVE NEGATIVE   RBC / HPF 6-10 0 - 5 RBC/hpf   WBC, UA 0-5 0 - 5 WBC/hpf   Bacteria, UA NONE SEEN NONE SEEN   Mucus PRESENT    Hyaline Casts, UA PRESENT     Comment: Performed at [facility], [place] [address] [place], [place] [address]  D-dimer, quantitative (not at [facility])     Status: Abnormal   Collection Time: 01/11/20  8:10 PM  Result Value Ref Range   D-Dimer, Quant 2.11 (H) 0.00 - 0.50 ug/mL-FEU    Comment: (NOTE) At the manufacturer cut-off of 0.50 ug/mL FEU, this assay has been documented to exclude PE with a sensitivity and negative predictive value of 97 to 99%.  At this time, this assay has not been approved by the FDA to exclude DVT/VTE. Results should be correlated with clinical presentation. Performed at [facility], [place] [address], [place], [place] [address]   I-stat chem 8, ED (not at [facility] or [facility])     Status: Abnormal   Collection Time: 01/11/20  8:36 PM  Result Value Ref Range   Sodium 135 135 - 145 mmol/L   Potassium 3.7 3.5 - 5.1 mmol/L   Chloride 97 (L) 98 - 111 mmol/L   BUN 17 6 - 20 mg/dL   Creatinine, Ser 1.00 0.61 - 1.24 mg/dL   Glucose, Bld 105 (H) 70 - 99 mg/dL    Comment: Glucose reference range applies only to samples taken after fasting for at least 8 hours.   Calcium, Ion 1.08 (L) 1.15 - 1.40 mmol/L   TCO2 30 22 - 32 mmol/L   Hemoglobin 14.3 13.0 - 17.0 g/dL   HCT 42.0 39.0 - 52.0 %    CT Head Wo Contrast  Result Date: 01/11/2020 CLINICAL DATA:  Posttraumatic headache after fall. EXAM: CT HEAD WITHOUT CONTRAST CT CERVICAL SPINE WITHOUT CONTRAST TECHNIQUE: Multidetector CT imaging of the head and cervical spine was performed following the standard protocol without intravenous contrast. Multiplanar CT image reconstructions of the cervical spine were also generated. COMPARISON:  June 18, 2016. FINDINGS: CT HEAD FINDINGS Brain: No evidence of acute infarction, hemorrhage, hydrocephalus, extra-axial collection or mass lesion/mass effect. Vascular: No hyperdense vessel or unexpected calcification. Skull: Normal. Negative for fracture or focal lesion. Sinuses/Orbits: No acute finding. Other: None. CT CERVICAL SPINE FINDINGS Alignment: Normal. Skull base and vertebrae: No acute fracture. No primary bone lesion or focal pathologic process. Soft tissues and spinal canal: No prevertebral fluid or swelling. No visible canal hematoma. Disc levels: Status post surgical anterior fusion of C3-4 and C4-5. Severe degenerative disc disease is noted at C5-6 and C6-7 with anterior osteophyte formation. Upper chest: Negative. Other: None. IMPRESSION: 1. Normal head CT. 2. Postsurgical and degenerative changes as described above. No acute abnormality seen in the cervical spine. Electronically Signed    By: Marijo Conception M.D.   On: 01/11/2020 18:41   CT Angio Chest PE W and/or Wo Contrast  Result Date: 01/11/2020 CLINICAL DATA:  Elevated D-dimer. Syncopal episode. Dyspnea. EXAM: CT ANGIOGRAPHY CHEST WITH CONTRAST TECHNIQUE: Multidetector CT imaging of the chest was performed using the standard protocol during bolus administration of intravenous contrast. Multiplanar CT image reconstructions and MIPs were obtained to evaluate the vascular anatomy. Automatic exposure control utilized. CONTRAST:  154mL OMNIPAQUE IOHEXOL  350 MG/ML SOLN COMPARISON:  None. FINDINGS: Cardiovascular: Satisfactory opacification of the pulmonary arteries to the segmental level. No evidence of pulmonary embolism. Normal heart size. No pericardial effusion. Mild LAD calcification. Thoracic aorta calcified atherosclerosis without aneurysmal dilatation. Mediastinum/Nodes: No adenopathy. Normal decompressed esophagus. Lungs/Pleura: No pneumonia, pulmonary edema, pleural effusion or pneumothorax. Respiratory motion artifact which degrades image quality. Upper Abdomen: Mild hepatomegaly and fatty infiltration of the liver with probable focal fatty sparing at the gallbladder fossa. A couple small noncalcified stones or sludge in the dependent gallbladder neck with an elongated 11 cm gallbladder length, and borderline 4.2 cm gallbladder dilatation, but without gallbladder wall thickening or para cholecystic fluid or edema. Splenule. 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 Musculoskeletal: Demineralization. Benign left humeral head bone island. Moderate degenerative changes of the lower cervical and lumbar spine. Review of the MIP images confirms the above findings. IMPRESSION: No pulmonary embolism. Mild LAD coronary calcification. A couple small noncalcified stones or sludge in the dependent gallbladder neck with an elongated 11 cm gallbladder length, and borderline 4.2 cm gallbladder dilatation, but without  gallbladder wall thickening or paracholecystic fluid or edema. If there is concern for acute or chronic cholecystitis, hepatic biliary scintigraphy could be considered. Aortic Atherosclerosis (ICD10-I70.0). Electronically Signed   By: Laurence Ferrari   On: 01/11/2020 23:09   CT Cervical Spine Wo Contrast  Result Date: 01/11/2020 CLINICAL DATA:  Posttraumatic headache after fall. EXAM: CT HEAD WITHOUT CONTRAST CT CERVICAL SPINE WITHOUT CONTRAST TECHNIQUE: Multidetector CT imaging of the head and cervical spine was performed following the standard protocol without intravenous contrast. Multiplanar CT image reconstructions of the cervical spine were also generated. COMPARISON:  June 18, 2016. FINDINGS: CT HEAD FINDINGS Brain: No evidence of acute infarction, hemorrhage, hydrocephalus, extra-axial collection or mass lesion/mass effect. Vascular: No hyperdense vessel or unexpected calcification. Skull: Normal. Negative for fracture or focal lesion. Sinuses/Orbits: No acute finding. Other: None. CT CERVICAL SPINE FINDINGS Alignment: Normal. Skull base and vertebrae: No acute fracture. No primary bone lesion or focal pathologic process. Soft tissues and spinal canal: No prevertebral fluid or swelling. No visible canal hematoma. Disc levels: Status post surgical anterior fusion of C3-4 and C4-5. Severe degenerative disc disease is noted at C5-6 and C6-7 with anterior osteophyte formation. Upper chest: Negative. Other: None. IMPRESSION: 1. Normal head CT. 2. Postsurgical and degenerative changes as described above. No acute abnormality seen in the cervical spine. Electronically Signed   By: Lupita Raider M.D.   On: 01/11/2020 18:41   MR BRAIN WO CONTRAST  Result Date: 01/11/2020 CLINICAL DATA:  Dizziness EXAM: MRI HEAD WITHOUT CONTRAST TECHNIQUE: Multiplanar, multiecho pulse sequences of the brain and surrounding structures were obtained without intravenous contrast. COMPARISON:  None. FINDINGS: Brain: There is no  acute infarction or intracranial hemorrhage. There is no intracranial mass, mass effect, or edema. There is no hydrocephalus or extra-axial fluid collection. Ventricles and sulci are normal in size and configuration. Minimal small foci of T2 hyperintensity in the supratentorial white matter are nonspecific but may reflect minor chronic microvascular ischemic changes or other etiologies of gliosis/demyelination. Vascular: Major vessel flow voids at the skull base are preserved. Skull and upper cervical spine: Partially imaged anterior few extending inferiorly from C3. Normal marrow signal is preserved. Sinuses/Orbits: Mild mucosal thickening.  Orbits are unremarkable. Other: Sella is unremarkable.  Mastoid air cells are clear. IMPRESSION: No significant abnormality. Electronically Signed   By: Guadlupe Spanish M.D.   On: 01/11/2020 21:47   CT Hip Right  Wo Contrast  Result Date: 01/11/2020 CLINICAL DATA:  Pelvic trauma right hip pain after fall EXAM: CT OF THE RIGHT HIP WITHOUT CONTRAST TECHNIQUE: Multidetector CT imaging of the right hip was performed according to the standard protocol. Multiplanar CT image reconstructions were also generated. COMPARISON:  Radiographs 01/11/2020, CT 01/25/2017 FINDINGS: Bones/Joint/Cartilage No dislocation. Acute nondisplaced fracture best seen on coronal and sagittal reconstructions that involves the right superior pubic ramus and extends to the anterior articular surface of the hip joint and anterior wall of the acetabulum. Small hip effusion is present. Ligaments Suboptimally assessed by CT. Muscles and Tendons No intramuscular fluid collections.  No significant atrophy. Soft tissues Mild soft tissue edema.  Right femoral vascular calcification. IMPRESSION: Acute nondisplaced fracture that involves the right superior pubic ramus and extends to involve the right anterior wall of the acetabulum and anterior articular surface of the hip joint. Electronically Signed   By: Jasmine Pang M.D.   On: 01/11/2020 22:07   DG Hip Unilat W or Wo Pelvis 2-3 Views Right  Result Date: 01/11/2020 CLINICAL DATA:  Pain status post fall.  Right posterior hip pain. EXAM: DG HIP (WITH OR WITHOUT PELVIS) 2-3V RIGHT COMPARISON:  None. FINDINGS: There is mild osteopenia. Mild-to-moderate degenerative changes are noted of both hips. There is no acute displaced fracture or dislocation. IMPRESSION: Negative. Electronically Signed   By: Katherine Mantle M.D.   On: 01/11/2020 18:05    Review of Systems Blood pressure (!) 141/90, pulse 68, temperature 98.6 F (37 C), temperature source Oral, resp. rate 17, height 5\' 10"  (1.778 m), weight 80.7 kg, SpO2 100 %. Physical Exam AAO/ NAD, neck non tender with pain free AROM, bilateral clavicles nontender, bilateral shoulders, elbows, and wrists with pain free AROM, normal strength, sensation intact Right LE with painful AROM of the hip, minimal pain with logroll, left hip knee and ankle ROM pain free NVI distally  Assessment/Plan: Right non-displaced anterior wall acetabular fracture after ground level fall.  Recommend Non Weight bearing Right LE for 6 weeks and therapy for mobilzation.  No surgery indicated for this injury.  DVT prophylaxis - has history of prior DVT  01/12/2020, 1:28 AM

## 2020-01-12 NOTE — Progress Notes (Signed)
PROGRESS NOTE    Christopher Underwood  AVW:[id number] DOB: July 06, 1966 DOA: 01/11/2020 PCP: Macy Mis, MD   Brief Narrative: Christopher Underwood is a 54 y.o. male with medical history significant for depression, anxiety, ADD, history of DVT no longer anticoagulated, and vertigo. Patient presented secondary to severe hip pain after a fall and found to have a right pubic ramus/non-displaced anterior wall acetabular fractures. Orthopedic surgery evaluated and recommended NWB for 6 weeks.   Assessment & Plan:   Principal Problem:   Pubic ramus fracture, right, closed, initial encounter (HCC) Active Problems:   Vertigo   Positive D dimer   Hypertension   Depression with anxiety   Chronic pain   Closed right hip fracture, initial encounter (HCC)   Peripheral vertigo   Pubic ramus fracture Right non-displaced anterior wall acetabular fracture Orthopedic surgery consulted and recommending non-operative care. Recommendations for NWB of right LE for at least 6 weeks -Orthopedic surgery recommendations: outpatient follow-up -PT, OT eval  Essential hypertension -Continue lisinopril and hydrochlorothiazide   Depression Anxiety -Continue vilazodone  Peripheral vertigo MRI negative for abnormalities. Patient is scheduled to follow-up with neurology as an outpatient -Continue meclizine prn  Elevated d-dimer Patient with a history of left DVT. No signs/symptoms for PE -LE venous duplex   DVT prophylaxis: Lovenox Code Status:   Code Status: Full Code Family Communication: None at bedside Disposition Plan: Discharge pending PT/OT recommendations   Consultants:   Orthopedic surgery  Procedures:   None  Antimicrobials:  None    Subjective: Some right hip pain. No other issues  Objective: Vitals:   01/12/20 0100 01/12/20 0150 01/12/20 0548 01/12/20 0937  BP: (!) 141/90 (!) 158/91 132/79 127/83  Pulse: 68  78 90  Resp: 17 12 16 16   Temp:  98.1 F (36.7 C) 99 F (37.2 C) 98.5  F (36.9 C)  TempSrc:  Oral Oral Oral  SpO2: 100% 100% 99% 98%  Weight:      Height:        Intake/Output Summary (Last 24 hours) at 01/12/2020 1328 Last data filed at 01/12/2020 1232 Gross per 24 hour  Intake 999 ml  Output 1100 ml  Net -101 ml   Filed Weights   01/11/20 1730  Weight: 80.7 kg    Examination:  General exam: Appears calm and comfortable Respiratory system: Clear to auscultation. Respiratory effort normal. Cardiovascular system: S1 & S2 heard, RRR. No murmurs, rubs, gallops or clicks. Gastrointestinal system: Abdomen is nondistended, soft and nontender. No organomegaly or masses felt. Normal bowel sounds heard. Central nervous system: Alert and oriented. No focal neurological deficits. Extremities: No edema. No calf tenderness Skin: No cyanosis. No rashes Psychiatry: Judgement and insight appear normal. Mood & affect appropriate.     Data Reviewed: I have personally reviewed following labs and imaging studies  CBC: Recent Labs  Lab 01/11/20 2010 01/11/20 2036 01/12/20 0443  WBC 12.1*  --  8.5  NEUTROABS 9.6*  --   --   HGB 13.4 14.3 12.0*  HCT 39.5 42.0 36.6*  MCV 102.6*  --  106.4*  PLT 151  --  147*   Basic Metabolic Panel: Recent Labs  Lab 01/11/20 2036 01/12/20 0443  NA 135 137  K 3.7 3.5  CL 97* 99  CO2  --  28  GLUCOSE 105* 94  BUN 17 13  CREATININE 1.00 0.99  CALCIUM  --  8.3*   GFR: Estimated Creatinine Clearance: 89.1 mL/min (by C-G formula based on SCr of 0.99  mg/dL). Liver Function Tests: No results for input(s): AST, ALT, ALKPHOS, BILITOT, PROT, ALBUMIN in the last 168 hours. No results for input(s): LIPASE, AMYLASE in the last 168 hours. No results for input(s): AMMONIA in the last 168 hours. Coagulation Profile: No results for input(s): INR, PROTIME in the last 168 hours. Cardiac Enzymes: No results for input(s): CKTOTAL, CKMB, CKMBINDEX, TROPONINI in the last 168 hours. BNP (last 3 results) No results for input(s):  PROBNP in the last 8760 hours. HbA1C: No results for input(s): HGBA1C in the last 72 hours. CBG: No results for input(s): GLUCAP in the last 168 hours. Lipid Profile: No results for input(s): CHOL, HDL, LDLCALC, TRIG, CHOLHDL, LDLDIRECT in the last 72 hours. Thyroid Function Tests: No results for input(s): TSH, T4TOTAL, FREET4, T3FREE, THYROIDAB in the last 72 hours. Anemia Panel: No results for input(s): VITAMINB12, FOLATE, FERRITIN, TIBC, IRON, RETICCTPCT in the last 72 hours. Sepsis Labs: No results for input(s): PROCALCITON, LATICACIDVEN in the last 168 hours.  Recent Results (from the past 240 hour(s))  SARS CORONAVIRUS 2 (TAT 6-24 HRS) Nasopharyngeal Nasopharyngeal Swab     Status: None   Collection Time: 01/11/20 11:23 PM   Specimen: Nasopharyngeal Swab  Result Value Ref Range Status   SARS Coronavirus 2 NEGATIVE NEGATIVE Final    Comment: (NOTE) SARS-CoV-2 target nucleic acids are NOT DETECTED. The SARS-CoV-2 RNA is generally detectable in upper and lower respiratory specimens during the acute phase of infection. Negative results do not preclude SARS-CoV-2 infection, do not rule out co-infections with other pathogens, and should not be used as the sole basis for treatment or other patient management decisions. Negative results must be combined with clinical observations, patient history, and epidemiological information. The expected result is Negative. Fact Sheet for Patients: HairSlick.no Fact Sheet for Healthcare Providers: quierodirigir.com This test is not yet approved or cleared by the [place] FDA and  has been authorized for detection and/or diagnosis of SARS-CoV-2 by FDA under an Emergency Use Authorization (EUA). This EUA will remain  in effect (meaning this test can be used) for the duration of the COVID-19 declaration under Section 56 4(b)(1) of the Act, 21 U.S.C. section 360bbb-3(b)(1), unless the  authorization is terminated or revoked sooner. Performed at [facility], [address], [place], [place] [address]          Radiology Studies: CT Head Wo Contrast  Result Date: 01/11/2020 CLINICAL DATA:  Posttraumatic headache after fall. EXAM: CT HEAD WITHOUT CONTRAST CT CERVICAL SPINE WITHOUT CONTRAST TECHNIQUE: Multidetector CT imaging of the head and cervical spine was performed following the standard protocol without intravenous contrast. Multiplanar CT image reconstructions of the cervical spine were also generated. COMPARISON:  June 18, 2016. FINDINGS: CT HEAD FINDINGS Brain: No evidence of acute infarction, hemorrhage, hydrocephalus, extra-axial collection or mass lesion/mass effect. Vascular: No hyperdense vessel or unexpected calcification. Skull: Normal. Negative for fracture or focal lesion. Sinuses/Orbits: No acute finding. Other: None. CT CERVICAL SPINE FINDINGS Alignment: Normal. Skull base and vertebrae: No acute fracture. No primary bone lesion or focal pathologic process. Soft tissues and spinal canal: No prevertebral fluid or swelling. No visible canal hematoma. Disc levels: Status post surgical anterior fusion of C3-4 and C4-5. Severe degenerative disc disease is noted at C5-6 and C6-7 with anterior osteophyte formation. Upper chest: Negative. Other: None. IMPRESSION: 1. Normal head CT. 2. Postsurgical and degenerative changes as described above. No acute abnormality seen in the cervical spine. Electronically Signed   By: Zenda Alpers.D.  On: 01/11/2020 18:41   CT Angio Chest PE W and/or Wo Contrast  Result Date: 01/11/2020 CLINICAL DATA:  Elevated D-dimer. Syncopal episode. Dyspnea. EXAM: CT ANGIOGRAPHY CHEST WITH CONTRAST TECHNIQUE: Multidetector CT imaging of the chest was performed using the standard protocol during bolus administration of intravenous contrast. Multiplanar CT image reconstructions and MIPs were obtained to evaluate the vascular anatomy.  Automatic exposure control utilized. CONTRAST:  OMNIPAQUE IOHEXOL 350 MG/ML SOLN COMPARISON:  None. FINDINGS: Cardiovascular: Satisfactory opacification of the pulmonary arteries to the segmental level. No evidence of pulmonary embolism. Normal heart size. No pericardial effusion. Mild LAD calcification. Thoracic aorta calcified atherosclerosis without aneurysmal dilatation. Mediastinum/Nodes: No adenopathy. Normal decompressed esophagus. Lungs/Pleura: No pneumonia, pulmonary edema, pleural effusion or pneumothorax. Respiratory motion artifact which degrades image quality. Upper Abdomen: Mild hepatomegaly and fatty infiltration of the liver with probable focal fatty sparing at the gallbladder fossa. A couple small noncalcified stones or sludge in the dependent gallbladder neck with an elongated 11 cm gallbladder length, and borderline 4.2 cm gallbladder dilatation, but without gallbladder wall thickening or para cholecystic fluid or edema. Splenule. 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 Musculoskeletal: Demineralization. Benign left humeral head bone island. Moderate degenerative changes of the lower cervical and lumbar spine. Review of the MIP images confirms the above findings. IMPRESSION: No pulmonary embolism. Mild LAD coronary calcification. A couple small noncalcified stones or sludge in the dependent gallbladder neck with an elongated 11 cm gallbladder length, and borderline 4.2 cm gallbladder dilatation, but without gallbladder wall thickening or paracholecystic fluid or edema. If there is concern for acute or chronic cholecystitis, hepatic biliary scintigraphy could be considered. Aortic Atherosclerosis (ICD10-I70.0). Electronically Signed   By: Laurence Ferrari   On: 01/11/2020 23:09   CT Cervical Spine Wo Contrast  Result Date: 01/11/2020 CLINICAL DATA:  Posttraumatic headache after fall. EXAM: CT HEAD WITHOUT CONTRAST CT CERVICAL SPINE WITHOUT CONTRAST TECHNIQUE:  Multidetector CT imaging of the head and cervical spine was performed following the standard protocol without intravenous contrast. Multiplanar CT image reconstructions of the cervical spine were also generated. COMPARISON:  June 18, 2016. FINDINGS: CT HEAD FINDINGS Brain: No evidence of acute infarction, hemorrhage, hydrocephalus, extra-axial collection or mass lesion/mass effect. Vascular: No hyperdense vessel or unexpected calcification. Skull: Normal. Negative for fracture or focal lesion. Sinuses/Orbits: No acute finding. Other: None. CT CERVICAL SPINE FINDINGS Alignment: Normal. Skull base and vertebrae: No acute fracture. No primary bone lesion or focal pathologic process. Soft tissues and spinal canal: No prevertebral fluid or swelling. No visible canal hematoma. Disc levels: Status post surgical anterior fusion of C3-4 and C4-5. Severe degenerative disc disease is noted at C5-6 and C6-7 with anterior osteophyte formation. Upper chest: Negative. Other: None. IMPRESSION: 1. Normal head CT. 2. Postsurgical and degenerative changes as described above. No acute abnormality seen in the cervical spine. Electronically Signed   By: Lupita Raider M.D.   On: 01/11/2020 18:41   MR BRAIN WO CONTRAST  Result Date: 01/11/2020 CLINICAL DATA:  Dizziness EXAM: MRI HEAD WITHOUT CONTRAST TECHNIQUE: Multiplanar, multiecho pulse sequences of the brain and surrounding structures were obtained without intravenous contrast. COMPARISON:  None. FINDINGS: Brain: There is no acute infarction or intracranial hemorrhage. There is no intracranial mass, mass effect, or edema. There is no hydrocephalus or extra-axial fluid collection. Ventricles and sulci are normal in size and configuration. Minimal small foci of T2 hyperintensity in the supratentorial white matter are nonspecific but may reflect minor chronic microvascular ischemic changes or other etiologies  of gliosis/demyelination. Vascular: Major vessel flow voids at the skull  base are preserved. Skull and upper cervical spine: Partially imaged anterior few extending inferiorly from C3. Normal marrow signal is preserved. Sinuses/Orbits: Mild mucosal thickening.  Orbits are unremarkable. Other: Sella is unremarkable.  Mastoid air cells are clear. IMPRESSION: No significant abnormality. Electronically Signed   By: Macy Mis M.D.   On: 01/11/2020 21:47   CT Hip Right Wo Contrast  Result Date: 01/11/2020 CLINICAL DATA:  Pelvic trauma right hip pain after fall EXAM: CT OF THE RIGHT HIP WITHOUT CONTRAST TECHNIQUE: Multidetector CT imaging of the right hip was performed according to the standard protocol. Multiplanar CT image reconstructions were also generated. COMPARISON:  Radiographs 01/11/2020, CT 01/25/2017 FINDINGS: Bones/Joint/Cartilage No dislocation. Acute nondisplaced fracture best seen on coronal and sagittal reconstructions that involves the right superior pubic ramus and extends to the anterior articular surface of the hip joint and anterior wall of the acetabulum. Small hip effusion is present. Ligaments Suboptimally assessed by CT. Muscles and Tendons No intramuscular fluid collections.  No significant atrophy. Soft tissues Mild soft tissue edema.  Right femoral vascular calcification. IMPRESSION: Acute nondisplaced fracture that involves the right superior pubic ramus and extends to involve the right anterior wall of the acetabulum and anterior articular surface of the hip joint. Electronically Signed   By: Donavan Foil M.D.   On: 01/11/2020 22:07   DG Hip Unilat W or Wo Pelvis 2-3 Views Right  Result Date: 01/11/2020 CLINICAL DATA:  Pain status post fall.  Right posterior hip pain. EXAM: DG HIP (WITH OR WITHOUT PELVIS) 2-3V RIGHT COMPARISON:  None. FINDINGS: There is mild osteopenia. Mild-to-moderate degenerative changes are noted of both hips. There is no acute displaced fracture or dislocation. IMPRESSION: Negative. Electronically Signed   By: Constance Holster  M.D.   On: 01/11/2020 18:05        Scheduled Meds: . Buprenorphine HCl  450 mcg Sublingual BID  . enoxaparin (LOVENOX) injection  40 mg Subcutaneous Daily  . lisinopril  20 mg Oral Daily   And  . hydrochlorothiazide  12.5 mg Oral Daily  . oxyCODONE-acetaminophen  1 tablet Oral TID AC & HS   And  . oxyCODONE  5 mg Oral TID AC & HS  . pantoprazole  40 mg Oral Daily  . sodium chloride flush  3 mL Intravenous Q12H  . Vilazodone HCl  20 mg Oral Daily   Continuous Infusions:   LOS: 0 days     Cordelia Poche, MD [facility] 01/12/2020, 1:28 PM  If 7PM-7AM, please contact night-coverage [email or web address]

## 2020-01-12 NOTE — Progress Notes (Signed)
VASCULAR LAB PRELIMINARY  PRELIMINARY  PRELIMINARY  PRELIMINARY  Left lower extremity venous duplex completed.    Preliminary report:  See CV proc for preliminary results.  Geneviene Tesch, RVT 01/12/2020, 5:24 PM
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
## 2020-01-13 DIAGNOSIS — S32401A Unspecified fracture of right acetabulum, initial encounter for closed fracture: Secondary | ICD-10-CM

## 2020-01-13 MED ORDER — FLUTICASONE PROPIONATE 50 MCG/ACT NA SUSP
2.0000 | Freq: Every day | NASAL | Status: DC | PRN
  Administered 2020-01-13: 2 via NASAL
  Filled 2020-01-13: qty 16

## 2020-01-13 NOTE — Progress Notes (Signed)
PROGRESS NOTE    Christopher Underwood  ZOX:[id number] DOB: [birth year] DOA: 01/11/2020 PCP: Macy Mis, MD   Brief Narrative: Christopher Underwood is a 54 y.o. male with medical history significant for depression, anxiety, ADD, history of DVT no longer anticoagulated, and vertigo. Patient presented secondary to severe hip pain after a fall and found to have a right pubic ramus/non-displaced anterior wall acetabular fractures. Orthopedic surgery evaluated and recommended NWB for 6 weeks.   Assessment & Plan:   Principal Problem:   Pubic ramus fracture, right, closed, initial encounter (HCC) Active Problems:   Vertigo   Positive D dimer   Hypertension   Depression with anxiety   Chronic pain   Closed right hip fracture, initial encounter (HCC)   Peripheral vertigo   Pubic ramus fracture Right non-displaced anterior wall acetabular fracture Orthopedic surgery consulted and recommending non-operative care. Recommendations for NWB of right LE for at least 6 weeks. PT/OT with recommendations -Orthopedic surgery recommendations: outpatient follow-up  Essential hypertension -Continue lisinopril and hydrochlorothiazide   Depression Anxiety -Continue vilazodone  Peripheral vertigo MRI negative for abnormalities. Patient is scheduled to follow-up with neurology as an outpatient -Continue meclizine prn  Elevated d-dimer Patient with a history of left DVT. No signs/symptoms for PE. LLE venous duplex negative for acute DVT   DVT prophylaxis: Lovenox Code Status:   Code Status: Full Code Family Communication: None at bedside Disposition Plan: Discharge tomorrow pending home health equipment   Consultants:   Orthopedic surgery  Procedures:   None  Antimicrobials:  None    Subjective: Pain is controlled with regimen.  Objective: Vitals:   01/12/20 1538 01/12/20 1833 01/12/20 2053 01/13/20 0627  BP: 112/89 (!) 118/91 118/81 129/83  Pulse: (!) 101 88 95 75  Resp:  Temp: 98.1 F (36.7 C) 98.2 F (36.8 C) 98.5 F (36.9 C) 98.4 F (36.9 C)  TempSrc: Oral Oral Oral Oral  SpO2: 98% 98% 99% 99%  Weight:      Height:        Intake/Output Summary (Last 24 hours) at 01/13/2020 1440 Last data filed at 01/13/2020 0630 Gross per 24 hour  Intake --  Output 1575 ml  Net -1575 ml   Filed Weights   01/11/20 1730  Weight: 80.7 kg    Examination:  General exam: Appears calm and comfortable Respiratory system: Clear to auscultation. Respiratory effort normal. Cardiovascular system: S1 & S2 heard, RRR. No murmurs, rubs, gallops or clicks. Gastrointestinal system: Abdomen is nondistended, soft and nontender. No organomegaly or masses felt. Normal bowel sounds heard. Central nervous system: Alert and oriented. No focal neurological deficits. Extremities: No edema. No calf tenderness. No ecchymosis over right hip noted. Skin: No cyanosis. No rashes Psychiatry: Judgement and insight appear normal. Mood & affect appropriate.     Data Reviewed: I have personally reviewed following labs and imaging studies  CBC: Recent Labs  Lab 01/11/20 2010 01/11/20 2036 01/12/20 0443  WBC 12.1*  --  8.5  NEUTROABS 9.6*  --   --   HGB 13.4 14.3 12.0*  HCT 39.5 42.0 36.6*  MCV 102.6*  --  106.4*  PLT 151  --  147*   Basic Metabolic Panel: Recent Labs  Lab 01/11/20 2036 01/12/20 0443  NA 135 137  K 3.7 3.5  CL 97* 99  CO2  --  28  GLUCOSE 105* 94  BUN 17 13  CREATININE 1.00 0.99  CALCIUM  --  8.3*   GFR:  Estimated Creatinine Clearance: 89.1 mL/min (by C-G formula based on SCr of 0.99 mg/dL). Liver Function Tests: No results for input(s): AST, ALT, ALKPHOS, BILITOT, PROT, ALBUMIN in the last 168 hours. No results for input(s): LIPASE, AMYLASE in the last 168 hours. No results for input(s): AMMONIA in the last 168 hours. Coagulation Profile: No results for input(s): INR, PROTIME in the last 168 hours. Cardiac Enzymes: No results for input(s):  CKTOTAL, CKMB, CKMBINDEX, TROPONINI in the last 168 hours. BNP (last 3 results) No results for input(s): PROBNP in the last 8760 hours. HbA1C: No results for input(s): HGBA1C in the last 72 hours. CBG: No results for input(s): GLUCAP in the last 168 hours. Lipid Profile: No results for input(s): CHOL, HDL, LDLCALC, TRIG, CHOLHDL, LDLDIRECT in the last 72 hours. Thyroid Function Tests: No results for input(s): TSH, T4TOTAL, FREET4, T3FREE, THYROIDAB in the last 72 hours. Anemia Panel: No results for input(s): VITAMINB12, FOLATE, FERRITIN, TIBC, IRON, RETICCTPCT in the last 72 hours. Sepsis Labs: No results for input(s): PROCALCITON, LATICACIDVEN in the last 168 hours.  Recent Results (from the past 240 hour(s))  SARS CORONAVIRUS 2 (TAT 6-24 HRS) Nasopharyngeal Nasopharyngeal Swab     Status: None   Collection Time: 01/11/20 11:23 PM   Specimen: Nasopharyngeal Swab  Result Value Ref Range Status   SARS Coronavirus 2 NEGATIVE NEGATIVE Final    Comment: (NOTE) SARS-CoV-2 target nucleic acids are NOT DETECTED. The SARS-CoV-2 RNA is generally detectable in upper and lower respiratory specimens during the acute phase of infection. Negative results do not preclude SARS-CoV-2 infection, do not rule out co-infections with other pathogens, and should not be used as the sole basis for treatment or other patient management decisions. Negative results must be combined with clinical observations, patient history, and epidemiological information. The expected result is Negative. Fact Sheet for Patients: SugarRoll.be Fact Sheet for Healthcare Providers: https://www.woods-mathews.com/ This test is not yet approved or cleared by the [place] FDA and  has been authorized for detection and/or diagnosis of SARS-CoV-2 by FDA under an Emergency Use Authorization (EUA). This EUA will remain  in effect (meaning this test can be used) for the duration of  the COVID-19 declaration under Section 56 4(b)(1) of the Act, 21 U.S.C. section 360bbb-3(b)(1), unless the authorization is terminated or revoked sooner. Performed at [facility], [place] [address], [place], [place] [address]          Radiology Studies: CT Head Wo Contrast  Result Date: 01/11/2020 CLINICAL DATA:  Posttraumatic headache after fall. EXAM: CT HEAD WITHOUT CONTRAST CT CERVICAL SPINE WITHOUT CONTRAST TECHNIQUE: Multidetector CT imaging of the head and cervical spine was performed following the standard protocol without intravenous contrast. Multiplanar CT image reconstructions of the cervical spine were also generated. COMPARISON:  June 18, 2016. FINDINGS: CT HEAD FINDINGS Brain: No evidence of acute infarction, hemorrhage, hydrocephalus, extra-axial collection or mass lesion/mass effect. Vascular: No hyperdense vessel or unexpected calcification. Skull: Normal. Negative for fracture or focal lesion. Sinuses/Orbits: No acute finding. Other: None. CT CERVICAL SPINE FINDINGS Alignment: Normal. Skull base and vertebrae: No acute fracture. No primary bone lesion or focal pathologic process. Soft tissues and spinal canal: No prevertebral fluid or swelling. No visible canal hematoma. Disc levels: Status post surgical anterior fusion of C3-4 and C4-5. Severe degenerative disc disease is noted at C5-6 and C6-7 with anterior osteophyte formation. Upper chest: Negative. Other: None. IMPRESSION: 1. Normal head CT. 2. Postsurgical and degenerative changes as described above. No acute abnormality seen in  the cervical spine. Electronically Signed   By: Lupita Raider M.D.   On: 01/11/2020 18:41   CT Angio Chest PE W and/or Wo Contrast  Result Date: 01/11/2020 CLINICAL DATA:  Elevated D-dimer. Syncopal episode. Dyspnea. EXAM: CT ANGIOGRAPHY CHEST WITH CONTRAST TECHNIQUE: Multidetector CT imaging of the chest was performed using the standard protocol during bolus administration of intravenous  contrast. Multiplanar CT image reconstructions and MIPs were obtained to evaluate the vascular anatomy. Automatic exposure control utilized. CONTRAST:  OMNIPAQUE IOHEXOL 350 MG/ML SOLN COMPARISON:  None. FINDINGS: Cardiovascular: Satisfactory opacification of the pulmonary arteries to the segmental level. No evidence of pulmonary embolism. Normal heart size. No pericardial effusion. Mild LAD calcification. Thoracic aorta calcified atherosclerosis without aneurysmal dilatation. Mediastinum/Nodes: No adenopathy. Normal decompressed esophagus. Lungs/Pleura: No pneumonia, pulmonary edema, pleural effusion or pneumothorax. Respiratory motion artifact which degrades image quality. Upper Abdomen: Mild hepatomegaly and fatty infiltration of the liver with probable focal fatty sparing at the gallbladder fossa. A couple small noncalcified stones or sludge in the dependent gallbladder neck with an elongated 11 cm gallbladder length, and borderline 4.2 cm gallbladder dilatation, but without gallbladder wall thickening or para cholecystic fluid or edema. Splenule. 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 Musculoskeletal: Demineralization. Benign left humeral head bone island. Moderate degenerative changes of the lower cervical and lumbar spine. Review of the MIP images confirms the above findings. IMPRESSION: No pulmonary embolism. Mild LAD coronary calcification. A couple small noncalcified stones or sludge in the dependent gallbladder neck with an elongated 11 cm gallbladder length, and borderline 4.2 cm gallbladder dilatation, but without gallbladder wall thickening or paracholecystic fluid or edema. If there is concern for acute or chronic cholecystitis, hepatic biliary scintigraphy could be considered. Aortic Atherosclerosis (ICD10-I70.0). Electronically Signed   By: Laurence Ferrari   On: 01/11/2020 23:09   CT Cervical Spine Wo Contrast  Result Date: 01/11/2020 CLINICAL DATA:  Posttraumatic  headache after fall. EXAM: CT HEAD WITHOUT CONTRAST CT CERVICAL SPINE WITHOUT CONTRAST TECHNIQUE: Multidetector CT imaging of the head and cervical spine was performed following the standard protocol without intravenous contrast. Multiplanar CT image reconstructions of the cervical spine were also generated. COMPARISON:  June 18, 2016. FINDINGS: CT HEAD FINDINGS Brain: No evidence of acute infarction, hemorrhage, hydrocephalus, extra-axial collection or mass lesion/mass effect. Vascular: No hyperdense vessel or unexpected calcification. Skull: Normal. Negative for fracture or focal lesion. Sinuses/Orbits: No acute finding. Other: None. CT CERVICAL SPINE FINDINGS Alignment: Normal. Skull base and vertebrae: No acute fracture. No primary bone lesion or focal pathologic process. Soft tissues and spinal canal: No prevertebral fluid or swelling. No visible canal hematoma. Disc levels: Status post surgical anterior fusion of C3-4 and C4-5. Severe degenerative disc disease is noted at C5-6 and C6-7 with anterior osteophyte formation. Upper chest: Negative. Other: None. IMPRESSION: 1. Normal head CT. 2. Postsurgical and degenerative changes as described above. No acute abnormality seen in the cervical spine. Electronically Signed   By: Lupita Raider M.D.   On: 01/11/2020 18:41   MR BRAIN WO CONTRAST  Result Date: 01/11/2020 CLINICAL DATA:  Dizziness EXAM: MRI HEAD WITHOUT CONTRAST TECHNIQUE: Multiplanar, multiecho pulse sequences of the brain and surrounding structures were obtained without intravenous contrast. COMPARISON:  None. FINDINGS: Brain: There is no acute infarction or intracranial hemorrhage. There is no intracranial mass, mass effect, or edema. There is no hydrocephalus or extra-axial fluid collection. Ventricles and sulci are normal in size and configuration. Minimal small foci of T2 hyperintensity in the supratentorial  white matter are nonspecific but may reflect minor chronic microvascular ischemic  changes or other etiologies of gliosis/demyelination. Vascular: Major vessel flow voids at the skull base are preserved. Skull and upper cervical spine: Partially imaged anterior few extending inferiorly from C3. Normal marrow signal is preserved. Sinuses/Orbits: Mild mucosal thickening.  Orbits are unremarkable. Other: Sella is unremarkable.  Mastoid air cells are clear. IMPRESSION: No significant abnormality. Electronically Signed   By: Guadlupe Spanish M.D.   On: 01/11/2020 21:47   CT Hip Right Wo Contrast  Result Date: 01/11/2020 CLINICAL DATA:  Pelvic trauma right hip pain after fall EXAM: CT OF THE RIGHT HIP WITHOUT CONTRAST TECHNIQUE: Multidetector CT imaging of the right hip was performed according to the standard protocol. Multiplanar CT image reconstructions were also generated. COMPARISON:  Radiographs 01/11/2020, CT 01/25/2017 FINDINGS: Bones/Joint/Cartilage No dislocation. Acute nondisplaced fracture best seen on coronal and sagittal reconstructions that involves the right superior pubic ramus and extends to the anterior articular surface of the hip joint and anterior wall of the acetabulum. Small hip effusion is present. Ligaments Suboptimally assessed by CT. Muscles and Tendons No intramuscular fluid collections.  No significant atrophy. Soft tissues Mild soft tissue edema.  Right femoral vascular calcification. IMPRESSION: Acute nondisplaced fracture that involves the right superior pubic ramus and extends to involve the right anterior wall of the acetabulum and anterior articular surface of the hip joint. Electronically Signed   By: Jasmine Pang M.D.   On: 01/11/2020 22:07   DG Hip Unilat W or Wo Pelvis 2-3 Views Right  Result Date: 01/11/2020 CLINICAL DATA:  Pain status post fall.  Right posterior hip pain. EXAM: DG HIP (WITH OR WITHOUT PELVIS) 2-3V RIGHT COMPARISON:  None. FINDINGS: There is mild osteopenia. Mild-to-moderate degenerative changes are noted of both hips. There is no acute  displaced fracture or dislocation. IMPRESSION: Negative. Electronically Signed   By: Katherine Mantle M.D.   On: 01/11/2020 18:05   VAS [place] LOWER EXTREMITY VENOUS (DVT)  Result Date: 01/13/2020  Lower Venous DVTStudy Indications: Elevated D-Dimer.  Risk Factors: Peroneal history of DVT May 2020, no test on file acute nondisplaced fracture of the right superior pubic ramus involving the right anterior wall of the acetabulum. Limitations: Clothing, right pubic ramus fracture. Comparison Study: No prior study on file for comparison. Performing Technologist: Sherren Kerns RVS  Examination Guidelines: A complete evaluation includes B-mode imaging, spectral Doppler, color Doppler, and power Doppler as needed of all accessible portions of each vessel. Bilateral testing is considered an integral part of a complete examination. Limited examinations for reoccurring indications may be performed as noted. The reflux portion of the exam is performed with the patient in reverse Trendelenburg.  Right Technical Findings: Right leg not evaluated.  +---------+---------------+---------+-----------+----------+-------------------+ LEFT     CompressibilityPhasicitySpontaneityPropertiesThrombus Aging      +---------+---------------+---------+-----------+----------+-------------------+ CFV                     Yes      Yes                  patent by color and                                                       Doppler             +---------+---------------+---------+-----------+----------+-------------------+  SFJ                     Yes      Yes                  patent by color     +---------+---------------+---------+-----------+----------+-------------------+ FV Prox  Full                                                             +---------+---------------+---------+-----------+----------+-------------------+ FV Mid   Full                                                              +---------+---------------+---------+-----------+----------+-------------------+ FV DistalFull                                                             +---------+---------------+---------+-----------+----------+-------------------+ PFV      Full                                                             +---------+---------------+---------+-----------+----------+-------------------+ POP      Full           Yes      Yes                                      +---------+---------------+---------+-----------+----------+-------------------+ PTV      Full                                                             +---------+---------------+---------+-----------+----------+-------------------+ PERO     Full                                                             +---------+---------------+---------+-----------+----------+-------------------+     Summary: LEFT: - There is no evidence of deep vein thrombosis in the lower extremity. However, portions of this examination were limited- see technologist comments above.  *See table(s) above for measurements and observations. Electronically signed by Fabienne Brunsharles Fields MD on 01/13/2020 at 12:32:19 PM.    Final         Scheduled Meds: . Buprenorphine HCl  450 mcg Sublingual BID  . enoxaparin (LOVENOX) injection  40 mg Subcutaneous Daily  . lisinopril  20 mg  Oral Daily   And  . hydrochlorothiazide  12.5 mg Oral Daily  . oxyCODONE-acetaminophen  1 tablet Oral TID AC & HS   And  . oxyCODONE  5 mg Oral TID AC & HS  . pantoprazole  40 mg Oral Daily  . sodium chloride flush  3 mL Intravenous Q12H  . Vilazodone HCl  20 mg Oral Daily   Continuous Infusions:   LOS: 1 day     Jacquelin Hawking, MD [facility] 01/13/2020, 2:40 PM  If 7PM-7AM, please contact night-coverage [email or web address]

## 2020-01-13 NOTE — Progress Notes (Signed)
    Durable Medical Equipment  (From admission, onward)         Start     Ordered   01/13/20 1432  For home use only DME lightweight manual wheelchair with seat cushion  Once    Comments: Patient suffers from right acetabular fracture which impairs their ability to perform daily activities like bathing, dressing and toileting in the home.  A cane, crutch or walker will not resolve issue with performing activities of daily living. A wheelchair will allow patient to safely perform daily activities. Patient can safely propel the wheelchair in the home or has a caregiver who can provide assistance. Length of need 6 months . Accessories: elevating leg rests (ELRs), wheel locks, extensions and anti-tippers.   01/13/20 1431   01/13/20 0923  For home use only DME wheelchair cushion (seat and back)  Once     01/13/20 2979   01/13/20 0922  For home use only DME Hospital bed  Once    Question Answer Comment  Length of Need 6 Months   Patient has (list medical condition): Right acetabular fracture   The above medical condition requires: Patient requires the ability to reposition frequently   Bed type Semi-electric      01/13/20 0922   01/13/20 0921  For home use only DME 3 n 1  Once     01/13/20 8921         Jacquelin Hawking, MD [facility] 01/13/2020, 2:39 PM

## 2020-01-13 NOTE — Progress Notes (Signed)
Orthopedics Progress Note  Subjective: Right hip improving slowly. Patient mobilizing with therapy and has walked in room only.  Objective:  Vitals:   01/12/20 2053 01/13/20 0627  BP: 118/81 129/83  Pulse: 95 75  Resp: 16 18  Temp: 98.5 F (36.9 C) 98.4 F (36.9 C)  SpO2: 99% 99%    General: Awake and alert  Musculoskeletal: Right hip with improved AROM, no pain with hip flexion in bed(heel slide) No pain with ankle pumps Neurovascularly intact  Lab Results  Component Value Date   WBC 8.5 01/12/2020   HGB 12.0 (L) 01/12/2020   HCT 36.6 (L) 01/12/2020   MCV 106.4 (H) 01/12/2020   PLT 147 (L) 01/12/2020       Component Value Date/Time   NA 137 01/12/2020 0443   K 3.5 01/12/2020 0443   CL 99 01/12/2020 0443   CO2 28 01/12/2020 0443   GLUCOSE 94 01/12/2020 0443   BUN 13 01/12/2020 0443   CREATININE 0.99 01/12/2020 0443   CALCIUM 8.3 (L) 01/12/2020 0443   GFRNONAA >60 01/12/2020 0443   GFRAA >60 01/12/2020 0443    Lab Results  Component Value Date   INR 0.94 11/15/2017   INR 0.95 05/30/2012   INR 1.03 02/01/2011    Assessment/Plan: Right anterior wall acetabular fracture after ground level fall  I reviewed the patient's imaging and case with Dr Daneil Dolin, orthopedic trauma and acetabular specialist who agreed that this injury will be managed non-operatively. Dr Carola Frost felt that the patient could be minimal weight bearing foot flat for balance with the walker. Continue PT and DVT prophylaxis Recommend outpatient follow up in two to three weeks with me in the office, [phone]  Almedia Balls. Ranell Patrick, MD 01/13/2020 10:11 AM
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
## 2020-01-13 NOTE — TOC Initial Note (Addendum)
Transition of Care [facility]) - Initial/Assessment Note    Patient Details  Name: Christopher Underwood MRN: [id number] Date of Birth: [birth year]  Transition of Care (TOC) CM/SW Contact:    Armanda Heritage, RN Phone Number: 01/13/2020, 3:23 PM  Clinical Narrative:                 Patient set up with [facility] for HHPT/OT but will have a delay in start of care until end of week.  All other are agencies have declined due to insurance/staffing.  Patient is aware of this and in agreement.  [employer] arranged to deliver wheelchair with seat cushion and bed to patient's home, patient declines 3in1.  Patient shares that he is comfortable with discharging home tomorrow prior to equipment delivery so he can let the driver into the home to deliver.  Patient's spouse has to be at work by 1 pm and she will need to pick him up from the hospital before she goes to work.     Expected Discharge Plan: Home w Home Health Services Barriers to Discharge: Continued Medical Work up   Patient Goals and CMS Choice Patient states their goals for this hospitalization and ongoing recovery are:: to go home CMS Medicare.gov Compare Post Acute Care list provided to:: Patient Choice offered to / list presented to : Patient  Expected Discharge Plan and Services Expected Discharge Plan: Home w Home Health Services   Discharge Planning Services: CM Consult Post Acute Care Choice: Home Health Living arrangements for the past 2 months: Single Family Home                 DME Arranged: Hospital bed, Lightweight manual wheelchair with seat cushion, 3-N-1 DME Agency: [employer] Date DME Agency Contacted: 01/13/20 Time DME Agency Contacted: [phone] Representative spoke with at DME Agency: [place] HH Arranged: PT, OT HH Agency: [facility] Date [facility] Agency Contacted: 01/13/20 Time HH Agency Contacted: [phone] Representative spoke with at [facility] Agency: Denyse Amass  Prior Living Arrangements/Services Living arrangements for the past 2  months: Single Family Home Lives with:: Adult Children Patient language and need for interpreter reviewed:: Yes Do you feel safe going back to the place where you live?: Yes      Need for Family Participation in Patient Care: Yes (Comment) Care giver support system in place?: Yes (comment)   Criminal Activity/Legal Involvement Pertinent to Current Situation/Hospitalization: No - Comment as needed  Activities of Daily Living Home Assistive Devices/Equipment: None ADL Screening (condition at time of admission) Patient's cognitive ability adequate to safely complete daily activities?: Yes Is the patient deaf or have difficulty hearing?: No Does the patient have difficulty seeing, even when wearing glasses/contacts?: No Does the patient have difficulty concentrating, remembering, or making decisions?: No Patient able to express need for assistance with ADLs?: Yes Does the patient have difficulty dressing or bathing?: No Independently performs ADLs?: Yes (appropriate for developmental age) Does the patient have difficulty walking or climbing stairs?: Yes Weakness of Legs: Both Weakness of Arms/Hands: None  Permission Sought/Granted                  Emotional Assessment Appearance:: Appears stated age         Psych Involvement: No (comment)  Admission diagnosis:  Closed right hip fracture, initial encounter (HCC) [S72.001A] Pubic ramus fracture, right, closed, initial encounter (HCC) [S32.591A] Peripheral vertigo, unspecified laterality [H81.399] Patient Active Problem List   Diagnosis Date Noted  . Closed right acetabular fracture (HCC) 01/13/2020  .  Pubic ramus fracture, right, closed, initial encounter ([place]) 01/12/2020  . Vertigo 01/12/2020  . Positive D dimer 01/12/2020  . Hypertension   . Chronic pain   . Peripheral vertigo   . Status post lumbar spine operative procedure for decompression of spinal cord 11/16/2017  . 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  Hypokalemia 06/16/2016  . Abdominal pain   . Alcohol withdrawal ([place]) 06/15/2016  . Alcoholic ketoacidosis 99/77/4142   PCP:  Katherina Mires, MD Pharmacy:   [facility] [place], [address] [place] [address] Phone: [phone] Fax: [phone]     Social Determinants of Health (SDOH) Interventions    Readmission Risk Interventions No flowsheet data found.

## 2020-01-13 NOTE — Progress Notes (Signed)
PT NOTE   Patient suffers from R acetabular fx which impairs their ability to perform daily activities like safe amb  in the home.  A walker alone will not resolve the issues with performing activities of daily living. A wheelchair will allow patient to safely perform daily activities.  The patient can self propel in the home or has a caregiver who can provide assistance.     Pt needs light wt wheelchair with removal/flip arms and ELRs. He is 5'10"  With a BMI of 25, please provide with narrow seat width.   Delice Bison, PT Acute Rehab Dept [facility]): [phone]   01/13/2020

## 2020-01-14 MED ORDER — POLYETHYLENE GLYCOL 3350 17 G PO PACK: 17.0000 g | PACK | Freq: Every day | ORAL | 0 refills | Status: DC

## 2020-01-14 NOTE — Progress Notes (Signed)
Patient given discharge, follow up, and medication instructions, verbalized understanding, IV removed, personal belongings with patient, wife to transport home

## 2020-01-14 NOTE — Discharge Instructions (Signed)
Christopher Underwood,  You were in the hospital and found to have a fracture of your pelvic bone. You will need to follow up with the orthopedic surgery but will not require surgery. Light weight bearing of the foot. You will be going home with home health services.

## 2020-01-14 NOTE — Discharge Summary (Signed)
Physician Discharge Summary  Christopher PlumMark A Mirza WUJ:[id number] DOB: 12/10/1965 DOA: 01/11/2020  PCP: Macy MisBriscoe, Kim K, MD  Admit date: 01/11/2020 Discharge date: 01/14/2020  Admitted From: Home Disposition: Home  Recommendations for Outpatient Follow-up:  1. Follow up with PCP in 1 week 2. Follow up with orthopedic surgery 3. Please follow up on the following pending results: None  Home Health: PT, OT Equipment/Devices: Wheelchair, hospital bed, 3 in 1 commode  Discharge Condition: Stable CODE STATUS: Full code Diet recommendation: Heart healthy   Brief/Interim Summary:  Admission HPI written by Briscoe Deutscherimothy S Opyd, MD   Chief Complaint: Vertigo, fall with severe right hip pain   HPI: Christopher PlumMark A Underwood is a 54 y.o. 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  Patient reports that he developed recurrent vertigo symptoms yesterday morning, suffered a fall secondary to this, and has been experiencing severe right hip pain since then.  He took some meclizine at home with improvement in his vertigo, has been able to bear weight, but is unable to ambulate due to severe pain at the right hip.  He denies any recent fevers, chills, chest pain, cough, shortness of breath, or leg swelling or tenderness.  ED Course: Upon arrival to the ED, patient is found to be afebrile, saturating 100% on room air, slightly tachycardic, and with stable blood pressure.  EKG features sinus rhythm with normal rate.  Chemistry panel unremarkable and CBC features a macrocytosis without anemia.  D-dimer is elevated to 2.11.  Chest x-ray and radiographs of the right hip are negative.  No acute abnormality on cervical spine CT and head CT is normal.  CT of the right hip is concerning for acute nondisplaced fracture of the right superior pubic ramus involving the right anterior wall of the  acetabulum.  CTA chest is negative for PE.  Patient was given a liter of saline, meclizine, morphine, and Zofran in the emergency department.  Orthopedic surgery was consulted by the ED physician.   Hospital course:  Pubic ramus fracture Right non-displaced anterior wall acetabular fracture Orthopedic surgery consulted and recommending non-operative care. Recommendations for NWB of right LE for at least 6 weeks. PT/OT recommending home health PT/OT, wheelchair, hospital bed, rolling walker. Orthopedic surgery recommending outpatient follow-up.  Essential hypertension Continue lisinopril and hydrochlorothiazide   Depression Anxiety Continue vilazodone.  Peripheral vertigo MRI negative for abnormalities. Patient is scheduled to follow-up with neurology as an outpatient. Continue meclizine prn.  Elevated d-dimer Patient with a history of left DVT. No signs/symptoms for PE. LLE venous duplex negative for acute DVT.  Discharge Diagnoses:  Principal Problem:   Closed right acetabular fracture (HCC) Active Problems:   Pubic ramus fracture, right, closed, initial encounter (HCC)   Vertigo   Positive D dimer   Hypertension   Chronic pain   Peripheral vertigo    Discharge Instructions  Discharge Instructions    Increase activity slowly   Complete by: As directed    Partial weight bearing   Complete by: As directed    25% body weight, with supervision and with walker, minimal weight bearing foot flat for balance with walker   Laterality: right   Extremity: Lower     Allergies as of 01/14/2020   No Known Allergies     Medication List    TAKE these medications   Adderall XR 20 MG 24 hr capsule Generic  drug: amphetamine-dextroamphetamine Take 20 mg by mouth every morning.   Belbuca 450 MCG Film Generic drug: Buprenorphine HCl Place 450 mcg under the tongue 2 (two) times daily.   cholecalciferol 1000 units tablet Commonly known as: VITAMIN D Take 1,000 Units by  mouth daily.   clonazePAM 0.5 MG tablet Commonly known as: KLONOPIN Take 0.5 mg by mouth 2 (two) times daily as needed for anxiety.   hydrOXYzine 25 MG capsule Commonly known as: VISTARIL Take 25 mg by mouth 2 (two) times daily as needed.   lisinopril-hydrochlorothiazide 20-12.5 MG tablet Commonly known as: ZESTORETIC Take 1 tablet by mouth daily.   meclizine 25 MG tablet Commonly known as: ANTIVERT Take 1 tablet (25 mg total) by mouth 3 (three) times daily as needed for dizziness.   methocarbamol 500 MG tablet Commonly known as: Robaxin Take 1 tablet (500 mg total) by mouth 3 (three) times daily.   metoprolol succinate 25 MG 24 hr tablet Commonly known as: TOPROL-XL Take 25 mg by mouth daily.   multivitamin with minerals Tabs tablet Take 1 tablet by mouth daily.   omeprazole 20 MG capsule Commonly known as: PRILOSEC Take 20 mg by mouth daily.   ondansetron 4 MG disintegrating tablet Commonly known as: Zofran ODT Take 1 tablet (4 mg total) by mouth every 8 (eight) hours as needed for nausea or vomiting.   oxyCODONE-acetaminophen 10-325 MG tablet Commonly known as: PERCOCET Take 1 tablet by mouth 4 (four) times daily.   polyethylene glycol 17 g packet Commonly known as: MIRALAX / GLYCOLAX Take 17 g by mouth daily.   Viibryd 20 MG Tabs Generic drug: Vilazodone HCl Take 20 mg by mouth daily.            Durable Medical Equipment  (From admission, onward)         Start     Ordered   01/13/20 1432  For home use only DME lightweight manual wheelchair with seat cushion  Once    Comments: Patient suffers from right acetabular fracture which impairs their ability to perform daily activities like bathing, dressing and toileting in the home.  A cane, crutch or walker will not resolve issue with performing activities of daily living. A wheelchair will allow patient to safely perform daily activities. Patient can safely propel the wheelchair in the home or has a  caregiver who can provide assistance. Length of need 6 months . Accessories: elevating leg rests (ELRs), wheel locks, extensions and anti-tippers.   01/13/20 1431   01/13/20 0923  For home use only DME wheelchair cushion (seat and back)  Once     01/13/20 9983   01/13/20 0922  For home use only DME Hospital bed  Once    Question Answer Comment  Length of Need 6 Months   Patient has (list medical condition): Right acetabular fracture   The above medical condition requires: Patient requires the ability to reposition frequently   Bed type Semi-electric      01/13/20 0922   01/13/20 0921  For home use only DME 3 n 1  Once     01/13/20 3825           Discharge Care Instructions  (From admission, onward)         Start     Ordered   01/13/20 0000  Partial weight bearing    Comments: 25% body weight, with supervision and with walker, minimal weight bearing foot flat for balance with walker  Question Answer Comment  Laterality right  Extremity Lower      01/13/20 1015         Follow-up Information    Beverely Low, MD Follow up in 2 week(s).   Specialty: Orthopedic Surgery Why: call [phone] for follow up appointment in two to three weeks Contact information: [address] [place] [address] [phone]        [facility] Follow up.   Specialty: Home Health Services Why: agency will provide home health physical and occupational therapy.   Contact information: [address] [place] [address] [phone]        Macy Mis, MD Follow up.   Specialty: Family Medicine Why: Hospital follow-up Contact information: [address] [place] [address] [phone]          No Known Allergies  Consultations:  Orthopedic surgery   Procedures/Studies: CT Head Wo Contrast  Result Date: 01/11/2020 CLINICAL DATA:  Posttraumatic headache after fall. EXAM: CT HEAD WITHOUT CONTRAST CT  CERVICAL SPINE WITHOUT CONTRAST TECHNIQUE: Multidetector CT imaging of the head and cervical spine was performed following the standard protocol without intravenous contrast. Multiplanar CT image reconstructions of the cervical spine were also generated. COMPARISON:  June 18, 2016. FINDINGS: CT HEAD FINDINGS Brain: No evidence of acute infarction, hemorrhage, hydrocephalus, extra-axial collection or mass lesion/mass effect. Vascular: No hyperdense vessel or unexpected calcification. Skull: Normal. Negative for fracture or focal lesion. Sinuses/Orbits: No acute finding. Other: None. CT CERVICAL SPINE FINDINGS Alignment: Normal. Skull base and vertebrae: No acute fracture. No primary bone lesion or focal pathologic process. Soft tissues and spinal canal: No prevertebral fluid or swelling. No visible canal hematoma. Disc levels: Status post surgical anterior fusion of C3-4 and C4-5. Severe degenerative disc disease is noted at C5-6 and C6-7 with anterior osteophyte formation. Upper chest: Negative. Other: None. IMPRESSION: 1. Normal head CT. 2. Postsurgical and degenerative changes as described above. No acute abnormality seen in the cervical spine. Electronically Signed   By: Lupita Raider M.D.   On: 01/11/2020 18:41   CT Angio Chest PE W and/or Wo Contrast  Result Date: 01/11/2020 CLINICAL DATA:  Elevated D-dimer. Syncopal episode. Dyspnea. EXAM: CT ANGIOGRAPHY CHEST WITH CONTRAST TECHNIQUE: Multidetector CT imaging of the chest was performed using the standard protocol during bolus administration of intravenous contrast. Multiplanar CT image reconstructions and MIPs were obtained to evaluate the vascular anatomy. Automatic exposure control utilized. CONTRAST:  OMNIPAQUE IOHEXOL 350 MG/ML SOLN COMPARISON:  None. FINDINGS: Cardiovascular: Satisfactory opacification of the pulmonary arteries to the segmental level. No evidence of pulmonary embolism. Normal heart size. No pericardial effusion. Mild LAD  calcification. Thoracic aorta calcified atherosclerosis without aneurysmal dilatation. Mediastinum/Nodes: No adenopathy. Normal decompressed esophagus. Lungs/Pleura: No pneumonia, pulmonary edema, pleural effusion or pneumothorax. Respiratory motion artifact which degrades image quality. Upper Abdomen: Mild hepatomegaly and fatty infiltration of the liver with probable focal fatty sparing at the gallbladder fossa. A couple small noncalcified stones or sludge in the dependent gallbladder neck with an elongated 11 cm gallbladder length, and borderline 4.2 cm gallbladder dilatation, but without gallbladder wall thickening or para cholecystic fluid or edema. Splenule. 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 Musculoskeletal: Demineralization. Benign left humeral head bone island. Moderate degenerative changes of the lower cervical and lumbar spine. Review of the MIP images confirms the above findings. IMPRESSION: No pulmonary embolism. Mild LAD coronary calcification. A couple small noncalcified stones or sludge in the dependent gallbladder neck with an elongated 11  cm gallbladder length, and borderline 4.2 cm gallbladder dilatation, but without gallbladder wall thickening or paracholecystic fluid or edema. If there is concern for acute or chronic cholecystitis, hepatic biliary scintigraphy could be considered. Aortic Atherosclerosis (ICD10-I70.0). Electronically Signed   By: Laurence Ferrari   On: 01/11/2020 23:09   CT Cervical Spine Wo Contrast  Result Date: 01/11/2020 CLINICAL DATA:  Posttraumatic headache after fall. EXAM: CT HEAD WITHOUT CONTRAST CT CERVICAL SPINE WITHOUT CONTRAST TECHNIQUE: Multidetector CT imaging of the head and cervical spine was performed following the standard protocol without intravenous contrast. Multiplanar CT image reconstructions of the cervical spine were also generated. COMPARISON:  June 18, 2016. FINDINGS: CT HEAD FINDINGS Brain: No evidence of acute  infarction, hemorrhage, hydrocephalus, extra-axial collection or mass lesion/mass effect. Vascular: No hyperdense vessel or unexpected calcification. Skull: Normal. Negative for fracture or focal lesion. Sinuses/Orbits: No acute finding. Other: None. CT CERVICAL SPINE FINDINGS Alignment: Normal. Skull base and vertebrae: No acute fracture. No primary bone lesion or focal pathologic process. Soft tissues and spinal canal: No prevertebral fluid or swelling. No visible canal hematoma. Disc levels: Status post surgical anterior fusion of C3-4 and C4-5. Severe degenerative disc disease is noted at C5-6 and C6-7 with anterior osteophyte formation. Upper chest: Negative. Other: None. IMPRESSION: 1. Normal head CT. 2. Postsurgical and degenerative changes as described above. No acute abnormality seen in the cervical spine. Electronically Signed   By: Lupita Raider M.D.   On: 01/11/2020 18:41   MR BRAIN WO CONTRAST  Result Date: 01/11/2020 CLINICAL DATA:  Dizziness EXAM: MRI HEAD WITHOUT CONTRAST TECHNIQUE: Multiplanar, multiecho pulse sequences of the brain and surrounding structures were obtained without intravenous contrast. COMPARISON:  None. FINDINGS: Brain: There is no acute infarction or intracranial hemorrhage. There is no intracranial mass, mass effect, or edema. There is no hydrocephalus or extra-axial fluid collection. Ventricles and sulci are normal in size and configuration. Minimal small foci of T2 hyperintensity in the supratentorial white matter are nonspecific but may reflect minor chronic microvascular ischemic changes or other etiologies of gliosis/demyelination. Vascular: Major vessel flow voids at the skull base are preserved. Skull and upper cervical spine: Partially imaged anterior few extending inferiorly from C3. Normal marrow signal is preserved. Sinuses/Orbits: Mild mucosal thickening.  Orbits are unremarkable. Other: Sella is unremarkable.  Mastoid air cells are clear. IMPRESSION: No  significant abnormality. Electronically Signed   By: Guadlupe Spanish M.D.   On: 01/11/2020 21:47   CT Hip Right Wo Contrast  Result Date: 01/11/2020 CLINICAL DATA:  Pelvic trauma right hip pain after fall EXAM: CT OF THE RIGHT HIP WITHOUT CONTRAST TECHNIQUE: Multidetector CT imaging of the right hip was performed according to the standard protocol. Multiplanar CT image reconstructions were also generated. COMPARISON:  Radiographs 01/11/2020, CT 01/25/2017 FINDINGS: Bones/Joint/Cartilage No dislocation. Acute nondisplaced fracture best seen on coronal and sagittal reconstructions that involves the right superior pubic ramus and extends to the anterior articular surface of the hip joint and anterior wall of the acetabulum. Small hip effusion is present. Ligaments Suboptimally assessed by CT. Muscles and Tendons No intramuscular fluid collections.  No significant atrophy. Soft tissues Mild soft tissue edema.  Right femoral vascular calcification. IMPRESSION: Acute nondisplaced fracture that involves the right superior pubic ramus and extends to involve the right anterior wall of the acetabulum and anterior articular surface of the hip joint. Electronically Signed   By: Jasmine Pang M.D.   On: 01/11/2020 22:07   DG Hip Unilat W or Wo Pelvis  2-3 Views Right  Result Date: 01/11/2020 CLINICAL DATA:  Pain status post fall.  Right posterior hip pain. EXAM: DG HIP (WITH OR WITHOUT PELVIS) 2-3V RIGHT COMPARISON:  None. FINDINGS: There is mild osteopenia. Mild-to-moderate degenerative changes are noted of both hips. There is no acute displaced fracture or dislocation. IMPRESSION: Negative. Electronically Signed   By: Katherine Mantle M.D.   On: 01/11/2020 18:05   VAS [place] LOWER EXTREMITY VENOUS (DVT)  Result Date: 01/13/2020  Lower Venous DVTStudy Indications: Elevated D-Dimer.  Risk Factors: Peroneal history of DVT May 2020, no test on file acute nondisplaced fracture of the right superior pubic ramus involving the  right anterior wall of the acetabulum. Limitations: Clothing, right pubic ramus fracture. Comparison Study: No prior study on file for comparison. Performing Technologist: Sherren Kerns RVS  Examination Guidelines: A complete evaluation includes B-mode imaging, spectral Doppler, color Doppler, and power Doppler as needed of all accessible portions of each vessel. Bilateral testing is considered an integral part of a complete examination. Limited examinations for reoccurring indications may be performed as noted. The reflux portion of the exam is performed with the patient in reverse Trendelenburg.  Right Technical Findings: Right leg not evaluated.  +---------+---------------+---------+-----------+----------+-------------------+ LEFT     CompressibilityPhasicitySpontaneityPropertiesThrombus Aging      +---------+---------------+---------+-----------+----------+-------------------+ CFV                     Yes      Yes                  patent by color and                                                       Doppler             +---------+---------------+---------+-----------+----------+-------------------+ SFJ                     Yes      Yes                  patent by color     +---------+---------------+---------+-----------+----------+-------------------+ FV Prox  Full                                                             +---------+---------------+---------+-----------+----------+-------------------+ FV Mid   Full                                                             +---------+---------------+---------+-----------+----------+-------------------+ FV DistalFull                                                             +---------+---------------+---------+-----------+----------+-------------------+ PFV      Full                                                              +---------+---------------+---------+-----------+----------+-------------------+  POP      Full           Yes      Yes                                      +---------+---------------+---------+-----------+----------+-------------------+ PTV      Full                                                             +---------+---------------+---------+-----------+----------+-------------------+ PERO     Full                                                             +---------+---------------+---------+-----------+----------+-------------------+     Summary: LEFT: - There is no evidence of deep vein thrombosis in the lower extremity. However, portions of this examination were limited- see technologist comments above.  *See table(s) above for measurements and observations. Electronically signed by Ruta Hinds MD on 01/13/2020 at 12:32:19 PM.    Final       Subjective: Pain is improved. Managed on home narcotic regimen.  Discharge Exam: Vitals:   01/14/20 0457 01/14/20 0806  BP: 134/87 131/85  Pulse: 61 67  Resp: 16   Temp: 99.2 F (37.3 C)   SpO2: 100%    Vitals:   01/13/20 1729 01/13/20 2050 01/14/20 0457 01/14/20 0806  BP: 115/67 117/76 134/87 131/85  Pulse: 73 77 61 67  Resp: 16 16 16    Temp: 97.9 F (36.6 C) 98.2 F (36.8 C) 99.2 F (37.3 C)   TempSrc: Oral Oral Oral   SpO2: 97% 96% 100%   Weight:      Height:        General: Pt is alert, awake, not in acute distress Cardiovascular: RRR, S1/S2 +, no rubs, no gallops Respiratory: CTA bilaterally, no wheezing, no rhonchi Abdominal: Soft, NT, ND, bowel sounds + Extremities: no edema, no cyanosis    The results of significant diagnostics from this hospitalization (including imaging, microbiology, ancillary and laboratory) are listed below for reference.     Microbiology: Recent Results (from the past 240 hour(s))  SARS CORONAVIRUS 2 (TAT 6-24 HRS) Nasopharyngeal Nasopharyngeal Swab     Status: None    Collection Time: 01/11/20 11:23 PM   Specimen: Nasopharyngeal Swab  Result Value Ref Range Status   SARS Coronavirus 2 NEGATIVE NEGATIVE Final    Comment: (NOTE) SARS-CoV-2 target nucleic acids are NOT DETECTED. The SARS-CoV-2 RNA is generally detectable in upper and lower respiratory specimens during the acute phase of infection. Negative results do not preclude SARS-CoV-2 infection, do not rule out co-infections with other pathogens, and should not be used as the sole basis for treatment or other patient management decisions. Negative results must be combined with clinical observations, patient history, and epidemiological information. The expected result is Negative. Fact Sheet for Patients: SugarRoll.be Fact Sheet for Healthcare Providers: https://www.woods-mathews.com/ This test is not yet approved or cleared by the [place] FDA and  has been authorized for detection and/or diagnosis of SARS-CoV-2 by FDA  under an Emergency Use Authorization (EUA). This EUA will remain  in effect (meaning this test can be used) for the duration of the COVID-19 declaration under Section 56 4(b)(1) of the Act, 21 U.S.C. section 360bbb-3(b)(1), unless the authorization is terminated or revoked sooner. Performed at [facility], [address] [place], [place] [address]      Labs: BNP (last 3 results) No results for input(s): BNP in the last 8760 hours. Basic Metabolic Panel: Recent Labs  Lab 01/11/20 2036 01/12/20 0443  NA 135 137  K 3.7 3.5  CL 97* 99  CO2  --  28  GLUCOSE 105* 94  BUN 17 13  CREATININE 1.00 0.99  CALCIUM  --  8.3*   Liver Function Tests: No results for input(s): AST, ALT, ALKPHOS, BILITOT, PROT, ALBUMIN in the last 168 hours. No results for input(s): LIPASE, AMYLASE in the last 168 hours. No results for input(s): AMMONIA in the last 168 hours. CBC: Recent Labs  Lab 01/11/20 2010 01/11/20 2036 01/12/20 0443    WBC 12.1*  --  8.5  NEUTROABS 9.6*  --   --   HGB 13.4 14.3 12.0*  HCT 39.5 42.0 36.6*  MCV 102.6*  --  106.4*  PLT 151  --  147*   Cardiac Enzymes: No results for input(s): CKTOTAL, CKMB, CKMBINDEX, TROPONINI in the last 168 hours. BNP: Invalid input(s): POCBNP CBG: No results for input(s): GLUCAP in the last 168 hours. D-Dimer Recent Labs    01/11/20 2010  DDIMER 2.11*   Hgb A1c No results for input(s): HGBA1C in the last 72 hours. Lipid Profile No results for input(s): CHOL, HDL, LDLCALC, TRIG, CHOLHDL, LDLDIRECT in the last 72 hours. Thyroid function studies No results for input(s): TSH, T4TOTAL, T3FREE, THYROIDAB in the last 72 hours.  Invalid input(s): FREET3 Anemia work up No results for input(s): VITAMINB12, FOLATE, FERRITIN, TIBC, IRON, RETICCTPCT in the last 72 hours. Urinalysis    Component Value Date/Time   COLORURINE YELLOW 01/11/2020 2010   APPEARANCEUR CLEAR 01/11/2020 2010   LABSPEC 1.008 01/11/2020 2010   PHURINE 7.0 01/11/2020 2010   GLUCOSEU NEGATIVE 01/11/2020 2010   HGBUR SMALL (A) 01/11/2020 2010   BILIRUBINUR NEGATIVE 01/11/2020 2010   KETONESUR NEGATIVE 01/11/2020 2010   PROTEINUR NEGATIVE 01/11/2020 2010   UROBILINOGEN 0.2 05/30/2012 1350   NITRITE NEGATIVE 01/11/2020 2010   LEUKOCYTESUR NEGATIVE 01/11/2020 2010   Sepsis Labs Invalid input(s): PROCALCITONIN,  WBC,  LACTICIDVEN Microbiology Recent Results (from the past 240 hour(s))  SARS CORONAVIRUS 2 (TAT 6-24 HRS) Nasopharyngeal Nasopharyngeal Swab     Status: None   Collection Time: 01/11/20 11:23 PM   Specimen: Nasopharyngeal Swab  Result Value Ref Range Status   SARS Coronavirus 2 NEGATIVE NEGATIVE Final    Comment: (NOTE) SARS-CoV-2 target nucleic acids are NOT DETECTED. The SARS-CoV-2 RNA is generally detectable in upper and lower respiratory specimens during the acute phase of infection. Negative results do not preclude SARS-CoV-2 infection, do not rule out co-infections  with other pathogens, and should not be used as the sole basis for treatment or other patient management decisions. Negative results must be combined with clinical observations, patient history, and epidemiological information. The expected result is Negative. Fact Sheet for Patients: HairSlick.no Fact Sheet for Healthcare Providers: quierodirigir.com This test is not yet approved or cleared by the [place] FDA and  has been authorized for detection and/or diagnosis of SARS-CoV-2 by FDA under an Emergency Use Authorization (EUA). This EUA will remain  in effect (meaning this test can be used) for the duration of the COVID-19 declaration under Section 56 4(b)(1) of the Act, 21 U.S.C. section 360bbb-3(b)(1), unless the authorization is terminated or revoked sooner. Performed at [facility], [address] [place], [place] [address]      Time coordinating discharge: 35 minutes  SIGNED:   Jacquelin Hawking, MD [facility] 01/14/2020, 11:05 AM
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
## 2020-03-26 ENCOUNTER — Ambulatory Visit: Payer: Medicaid Other | Admitting: Neurology

## 2020-09-12 ENCOUNTER — Emergency Department (HOSPITAL_COMMUNITY): Payer: Medicaid Other

## 2020-09-12 ENCOUNTER — Inpatient Hospital Stay (HOSPITAL_COMMUNITY)
Admission: EM | Admit: 2020-09-12 | Discharge: 2020-09-17 | DRG: 896 | Disposition: A | Discharge status: Home / Self Care | Payer: Medicaid Other | Attending: Internal Medicine | Admitting: Internal Medicine

## 2020-09-12 ENCOUNTER — Encounter (HOSPITAL_COMMUNITY): Payer: Self-pay

## 2020-09-12 DIAGNOSIS — E871 Hypo-osmolality and hyponatremia: Secondary | ICD-10-CM | POA: Diagnosis present

## 2020-09-12 DIAGNOSIS — F418 Other specified anxiety disorders: Secondary | ICD-10-CM | POA: Diagnosis present

## 2020-09-12 DIAGNOSIS — R441 Visual hallucinations: Secondary | ICD-10-CM | POA: Diagnosis present

## 2020-09-12 DIAGNOSIS — F17290 Nicotine dependence, other tobacco product, uncomplicated: Secondary | ICD-10-CM | POA: Diagnosis present

## 2020-09-12 DIAGNOSIS — Z8249 Family history of ischemic heart disease and other diseases of the circulatory system: Secondary | ICD-10-CM

## 2020-09-12 DIAGNOSIS — Z833 Family history of diabetes mellitus: Secondary | ICD-10-CM

## 2020-09-12 DIAGNOSIS — G9340 Encephalopathy, unspecified: Secondary | ICD-10-CM | POA: Diagnosis not present

## 2020-09-12 DIAGNOSIS — D6959 Other secondary thrombocytopenia: Secondary | ICD-10-CM | POA: Diagnosis present

## 2020-09-12 DIAGNOSIS — R21 Rash and other nonspecific skin eruption: Secondary | ICD-10-CM | POA: Diagnosis not present

## 2020-09-12 DIAGNOSIS — K760 Fatty (change of) liver, not elsewhere classified: Secondary | ICD-10-CM | POA: Diagnosis present

## 2020-09-12 DIAGNOSIS — Z20822 Contact with and (suspected) exposure to covid-19: Secondary | ICD-10-CM | POA: Diagnosis present

## 2020-09-12 DIAGNOSIS — I10 Essential (primary) hypertension: Secondary | ICD-10-CM | POA: Diagnosis present

## 2020-09-12 DIAGNOSIS — F1120 Opioid dependence, uncomplicated: Secondary | ICD-10-CM | POA: Diagnosis present

## 2020-09-12 DIAGNOSIS — E538 Deficiency of other specified B group vitamins: Secondary | ICD-10-CM | POA: Diagnosis present

## 2020-09-12 DIAGNOSIS — D539 Nutritional anemia, unspecified: Secondary | ICD-10-CM | POA: Diagnosis present

## 2020-09-12 DIAGNOSIS — R251 Tremor, unspecified: Secondary | ICD-10-CM | POA: Diagnosis present

## 2020-09-12 DIAGNOSIS — F988 Other specified behavioral and emotional disorders with onset usually occurring in childhood and adolescence: Secondary | ICD-10-CM | POA: Diagnosis present

## 2020-09-12 DIAGNOSIS — G928 Other toxic encephalopathy: Secondary | ICD-10-CM | POA: Diagnosis present

## 2020-09-12 DIAGNOSIS — F10239 Alcohol dependence with withdrawal, unspecified: Secondary | ICD-10-CM

## 2020-09-12 DIAGNOSIS — H81399 Other peripheral vertigo, unspecified ear: Secondary | ICD-10-CM | POA: Diagnosis present

## 2020-09-12 DIAGNOSIS — Z981 Arthrodesis status: Secondary | ICD-10-CM

## 2020-09-12 DIAGNOSIS — G8929 Other chronic pain: Secondary | ICD-10-CM | POA: Diagnosis present

## 2020-09-12 DIAGNOSIS — F10931 Alcohol use, unspecified with withdrawal delirium: Secondary | ICD-10-CM

## 2020-09-12 DIAGNOSIS — N179 Acute kidney failure, unspecified: Secondary | ICD-10-CM | POA: Diagnosis present

## 2020-09-12 DIAGNOSIS — M1990 Unspecified osteoarthritis, unspecified site: Secondary | ICD-10-CM | POA: Diagnosis present

## 2020-09-12 DIAGNOSIS — E872 Acidosis: Secondary | ICD-10-CM | POA: Diagnosis present

## 2020-09-12 DIAGNOSIS — R569 Unspecified convulsions: Secondary | ICD-10-CM

## 2020-09-12 DIAGNOSIS — F10231 Alcohol dependence with withdrawal delirium: Principal | ICD-10-CM | POA: Diagnosis present

## 2020-09-12 DIAGNOSIS — F10939 Alcohol use, unspecified with withdrawal, unspecified: Secondary | ICD-10-CM

## 2020-09-12 DIAGNOSIS — Z79899 Other long term (current) drug therapy: Secondary | ICD-10-CM

## 2020-09-12 LAB — URINALYSIS, ROUTINE W REFLEX MICROSCOPIC
Bacteria, UA: NONE SEEN
Bilirubin Urine: NEGATIVE
Glucose, UA: NEGATIVE mg/dL
Ketones, ur: NEGATIVE mg/dL
Leukocytes,Ua: NEGATIVE
Nitrite: NEGATIVE
Protein, ur: 100 mg/dL — AB
Specific Gravity, Urine: 1.013 (ref 1.005–1.030)
pH: 5 (ref 5.0–8.0)

## 2020-09-12 LAB — I-STAT VENOUS BLOOD GAS, ED
Acid-base deficit: 6 mmol/L — ABNORMAL HIGH (ref 0.0–2.0)
Bicarbonate: 18.3 mmol/L — ABNORMAL LOW (ref 20.0–28.0)
Calcium, Ion: 0.88 mmol/L — CL (ref 1.15–1.40)
HCT: 24 % — ABNORMAL LOW (ref 39.0–52.0)
Hemoglobin: 8.2 g/dL — ABNORMAL LOW (ref 13.0–17.0)
O2 Saturation: 95 %
Potassium: 3.7 mmol/L (ref 3.5–5.1)
Sodium: 134 mmol/L — ABNORMAL LOW (ref 135–145)
TCO2: 19 mmol/L — ABNORMAL LOW (ref 22–32)
pCO2, Ven: 32.1 mmHg — ABNORMAL LOW (ref 44.0–60.0)
pH, Ven: 7.366 (ref 7.250–7.430)
pO2, Ven: 75 mmHg — ABNORMAL HIGH (ref 32.0–45.0)

## 2020-09-12 LAB — RAPID URINE DRUG SCREEN, HOSP PERFORMED
Amphetamines: NOT DETECTED
Barbiturates: NOT DETECTED
Benzodiazepines: NOT DETECTED
Cocaine: NOT DETECTED
Opiates: POSITIVE — AB
Tetrahydrocannabinol: NOT DETECTED

## 2020-09-12 LAB — RESPIRATORY PANEL BY RT PCR (FLU A&B, COVID)
Influenza A by PCR: NEGATIVE
Influenza B by PCR: NEGATIVE
SARS Coronavirus 2 by RT PCR: NEGATIVE

## 2020-09-12 LAB — CK: Total CK: 406 U/L — ABNORMAL HIGH (ref 49–397)

## 2020-09-12 LAB — CBC WITH DIFFERENTIAL/PLATELET
Abs Immature Granulocytes: 0.08 10*3/uL — ABNORMAL HIGH (ref 0.00–0.07)
Basophils Absolute: 0.1 10*3/uL (ref 0.0–0.1)
Basophils Relative: 1 %
Eosinophils Absolute: 0.2 10*3/uL (ref 0.0–0.5)
Eosinophils Relative: 3 %
HCT: 33.6 % — ABNORMAL LOW (ref 39.0–52.0)
Hemoglobin: 11.1 g/dL — ABNORMAL LOW (ref 13.0–17.0)
Immature Granulocytes: 1 %
Lymphocytes Relative: 22 %
Lymphs Abs: 1.6 10*3/uL (ref 0.7–4.0)
MCH: 35.1 pg — ABNORMAL HIGH (ref 26.0–34.0)
MCHC: 33 g/dL (ref 30.0–36.0)
MCV: 106.3 fL — ABNORMAL HIGH (ref 80.0–100.0)
Monocytes Absolute: 1.4 10*3/uL — ABNORMAL HIGH (ref 0.1–1.0)
Monocytes Relative: 18 %
Neutro Abs: 4.2 10*3/uL (ref 1.7–7.7)
Neutrophils Relative %: 55 %
Platelets: 117 10*3/uL — ABNORMAL LOW (ref 150–400)
RBC: 3.16 MIL/uL — ABNORMAL LOW (ref 4.22–5.81)
RDW: 11.5 % (ref 11.5–15.5)
WBC: 7.5 10*3/uL (ref 4.0–10.5)
nRBC: 0 % (ref 0.0–0.2)

## 2020-09-12 LAB — COMPREHENSIVE METABOLIC PANEL
ALT: 46 U/L — ABNORMAL HIGH (ref 0–44)
AST: 79 U/L — ABNORMAL HIGH (ref 15–41)
Albumin: 3.6 g/dL (ref 3.5–5.0)
Alkaline Phosphatase: 76 U/L (ref 38–126)
Anion gap: 20 — ABNORMAL HIGH (ref 5–15)
BUN: 19 mg/dL (ref 6–20)
CO2: 15 mmol/L — ABNORMAL LOW (ref 22–32)
Calcium: 7.9 mg/dL — ABNORMAL LOW (ref 8.9–10.3)
Chloride: 92 mmol/L — ABNORMAL LOW (ref 98–111)
Creatinine, Ser: 2.13 mg/dL — ABNORMAL HIGH (ref 0.61–1.24)
GFR, Estimated: 36 mL/min — ABNORMAL LOW (ref >60)
Glucose, Bld: 66 mg/dL — ABNORMAL LOW (ref 70–99)
Potassium: 3.9 mmol/L (ref 3.5–5.1)
Sodium: 127 mmol/L — ABNORMAL LOW (ref 135–145)
Total Bilirubin: 0.9 mg/dL (ref 0.3–1.2)
Total Protein: 7.2 g/dL (ref 6.5–8.1)

## 2020-09-12 LAB — TROPONIN I (HIGH SENSITIVITY)
Troponin I (High Sensitivity): 5 ng/L (ref <18)
Troponin I (High Sensitivity): 4 ng/L (ref <18)

## 2020-09-12 LAB — ETHANOL: Alcohol, Ethyl (B): <10 mg/dL (ref <10)

## 2020-09-12 LAB — OSMOLALITY: Osmolality: 273 mOsm/kg — ABNORMAL LOW (ref 275–295)

## 2020-09-12 LAB — CBG MONITORING, ED
Glucose-Capillary: 66 mg/dL — ABNORMAL LOW (ref 70–99)
Glucose-Capillary: 80 mg/dL (ref 70–99)

## 2020-09-12 LAB — MAGNESIUM: Magnesium: 0.8 mg/dL — CL (ref 1.7–2.4)

## 2020-09-12 LAB — LACTIC ACID, PLASMA: Lactic Acid, Venous: 0.8 mmol/L (ref 0.5–1.9)

## 2020-09-12 LAB — LIPASE, BLOOD: Lipase: 53 U/L — ABNORMAL HIGH (ref 11–51)

## 2020-09-12 LAB — AMMONIA: Ammonia: 42 umol/L — ABNORMAL HIGH (ref 9–35)

## 2020-09-12 LAB — TSH: TSH: 6.378 u[IU]/mL — ABNORMAL HIGH (ref 0.350–4.500)

## 2020-09-12 LAB — PHOSPHORUS: Phosphorus: 4.3 mg/dL (ref 2.5–4.6)

## 2020-09-12 MED ORDER — SODIUM CHLORIDE 0.9 % IV SOLN
2.0000 g | Freq: Every day | INTRAVENOUS | Status: DC
  Administered 2020-09-13: 2 g via INTRAVENOUS
  Filled 2020-09-12: qty 20

## 2020-09-12 MED ORDER — PANTOPRAZOLE SODIUM 40 MG PO TBEC: 40.0000 mg | DELAYED_RELEASE_TABLET | Freq: Every day | ORAL | Status: DC

## 2020-09-12 MED ORDER — ADULT MULTIVITAMIN W/MINERALS CH
1.0000 | ORAL_TABLET | Freq: Every day | ORAL | Status: DC
  Administered 2020-09-12 – 2020-09-17 (×5): 1 via ORAL
  Filled 2020-09-12 (×5): qty 1

## 2020-09-12 MED ORDER — SODIUM CHLORIDE 0.9 % IV BOLUS
1000.0000 mL | Freq: Once | INTRAVENOUS | Status: AC
  Administered 2020-09-12: 1000 mL via INTRAVENOUS

## 2020-09-12 MED ORDER — DEXMEDETOMIDINE HCL IN NACL 400 MCG/100ML IV SOLN
0.0000 ug/kg/h | INTRAVENOUS | Status: DC
  Administered 2020-09-12: 0.4 ug/kg/h via INTRAVENOUS
  Administered 2020-09-13: 0.8 ug/kg/h via INTRAVENOUS
  Administered 2020-09-13: 0.9 ug/kg/h via INTRAVENOUS
  Administered 2020-09-13: 0.7 ug/kg/h via INTRAVENOUS
  Filled 2020-09-12 (×3): qty 100
  Filled 2020-09-12: qty 200

## 2020-09-12 MED ORDER — LACTATED RINGERS IV BOLUS
500.0000 mL | Freq: Once | INTRAVENOUS | Status: AC
  Administered 2020-09-12: 500 mL via INTRAVENOUS

## 2020-09-12 MED ORDER — LORAZEPAM 2 MG/ML IJ SOLN
0.0000 mg | Freq: Four times a day (QID) | INTRAMUSCULAR | Status: DC
  Administered 2020-09-12: 2 mg via INTRAVENOUS
  Administered 2020-09-13: 4 mg via INTRAVENOUS
  Filled 2020-09-12: qty 1
  Filled 2020-09-12: qty 2

## 2020-09-12 MED ORDER — POLYETHYLENE GLYCOL 3350 17 G PO PACK: 17.0000 g | PACK | Freq: Every day | ORAL | Status: DC | PRN

## 2020-09-12 MED ORDER — DEXTROSE 50 % IV SOLN: 50.0000 mL | Freq: Once | INTRAVENOUS | Status: DC

## 2020-09-12 MED ORDER — LORAZEPAM 0.5 MG PO TABS: 1.0000 mg | ORAL_TABLET | ORAL | Status: DC | PRN

## 2020-09-12 MED ORDER — MAGNESIUM SULFATE 2 GM/50ML IV SOLN
2.0000 g | Freq: Once | INTRAVENOUS | Status: AC
  Administered 2020-09-12: 2 g via INTRAVENOUS
  Filled 2020-09-12: qty 50

## 2020-09-12 MED ORDER — THIAMINE HCL 100 MG/ML IJ SOLN
100.0000 mg | Freq: Every day | INTRAMUSCULAR | Status: DC
  Administered 2020-09-12 – 2020-09-13 (×2): 100 mg via INTRAVENOUS
  Filled 2020-09-12 (×2): qty 2

## 2020-09-12 MED ORDER — LORAZEPAM 2 MG/ML IJ SOLN: 0.0000 mg | Freq: Two times a day (BID) | INTRAMUSCULAR | Status: DC

## 2020-09-12 MED ORDER — HEPARIN SODIUM (PORCINE) 5000 UNIT/ML IJ SOLN
5000.0000 [IU] | Freq: Three times a day (TID) | INTRAMUSCULAR | Status: DC
  Administered 2020-09-13 – 2020-09-17 (×14): 5000 [IU] via SUBCUTANEOUS
  Filled 2020-09-12 (×14): qty 1

## 2020-09-12 MED ORDER — LORAZEPAM 2 MG/ML IJ SOLN
2.0000 mg | Freq: Once | INTRAMUSCULAR | Status: AC
  Administered 2020-09-12: 2 mg via INTRAVENOUS
  Filled 2020-09-12: qty 1

## 2020-09-12 MED ORDER — DEXMEDETOMIDINE BOLUS VIA INFUSION
1.0000 ug/kg | Freq: Once | INTRAVENOUS | Status: AC
  Administered 2020-09-12: 74.8 ug via INTRAVENOUS
  Filled 2020-09-12: qty 75

## 2020-09-12 MED ORDER — DIAZEPAM 5 MG/ML IJ SOLN
5.0000 mg | Freq: Once | INTRAMUSCULAR | Status: AC
  Administered 2020-09-12: 5 mg via INTRAVENOUS
  Filled 2020-09-12: qty 2

## 2020-09-12 MED ORDER — FOLIC ACID 1 MG PO TABS
1.0000 mg | ORAL_TABLET | Freq: Every day | ORAL | Status: DC
  Administered 2020-09-12: 1 mg via ORAL
  Filled 2020-09-12: qty 1

## 2020-09-12 MED ORDER — DOCUSATE SODIUM 100 MG PO CAPS: 100.0000 mg | ORAL_CAPSULE | Freq: Two times a day (BID) | ORAL | Status: DC | PRN

## 2020-09-12 MED ORDER — DEXMEDETOMIDINE BOLUS VIA INFUSION: 1.0000 ug/kg | Freq: Once | INTRAVENOUS | Status: DC

## 2020-09-12 MED ORDER — THIAMINE HCL 100 MG PO TABS
100.0000 mg | ORAL_TABLET | Freq: Every day | ORAL | Status: DC
  Administered 2020-09-14 – 2020-09-15 (×2): 100 mg via ORAL
  Filled 2020-09-12 (×4): qty 1

## 2020-09-12 MED ORDER — LORAZEPAM 2 MG/ML IJ SOLN
1.0000 mg | INTRAMUSCULAR | Status: DC | PRN
  Administered 2020-09-13 (×2): 4 mg via INTRAVENOUS
  Filled 2020-09-12 (×2): qty 2

## 2020-09-12 NOTE — ED Provider Notes (Signed)
[facility] EMERGENCY DEPARTMENT Provider Note   CSN: [id number] Arrival date & time: 09/12/20  1723     History Chief Complaint  Patient presents with  . Seizures    Christopher Underwood is a 54 y.o. male with PMH of depression, arthritis, hypertension and vertigo who presented to the ED after a witnessed seizure at home. Patient's states he had dizziness and vomiting last week while in [place]. He was taking meclizine as prescribed for his vertigo. For the past 3 to 4 days he has had hand tremors and inability to control his arms sometimes. This morning, he experienced some burning sensation in his chest and felt like he was having a heart attack. States he fell out his bed and remembers seeing his wife call EMS. According to the wife, patient fell out of the bed and started seizing. He had jerking of both upper and lower extremities with shallow breathing and eyes rolled back.  Patient seized for about 1 to 2 minutes but did not have any incontinence. Patient states he was confused after the seizure.  Per wife, patient has had multiple episodes of syncope due to his vertigo but this is a first-time she has witnessed patient seizing.    Past Medical History:  Diagnosis Date  . Arthritis   . Attention deficit disorder   . Depression   . Hypertension     Patient Active Problem List   Diagnosis Date Noted  . Closed right acetabular fracture (HCC) 01/13/2020  . Pubic ramus fracture, right, closed, initial encounter (HCC) 01/12/2020  . Vertigo 01/12/2020  . Positive D dimer 01/12/2020  . Hypertension   . Chronic pain   . Peripheral vertigo   . Status post lumbar spine operative procedure for decompression of spinal cord 11/16/2017  . 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 Hypokalemia 06/16/2016  . Abdominal pain   . 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 cervical fusion with  plating x 2  2000  . left knee arthroscopy  1996   x 2  . lower back surgery  2011  . LUMBAR LAMINECTOMY/DECOMPRESSION MICRODISCECTOMY Left 11/16/2017   Procedure: LUMBAR LAMINECTOMY/DECOMPRESSION MICRODISCECTOMY;  Surgeon: Venita Lick, MD;  Location: [facility] OR;  Service: Orthopedics;  Laterality: Left;  . right arm surgery for donor skin graft  2000  . right foot surgery to clean out arthritis  2011  . right heel reconstruction  2000  . right heel surgery  to remove screw  2000  . right heel surgery for fx  2010  . right heel wound I and D for infection   2011       Family History  Problem Relation Age of Onset  . Diabetes Other   . Hypertension Other   . Heart attack Other     Social History   Tobacco Use  . Smoking status: Former Smoker    Packs/day: 0.25    Years: 1.00    Pack years: 0.25    Types: Cigarettes    Quit date: 10/26/1983    Years since quitting: 36.9  . Smokeless tobacco: Never Used  Substance Use Topics  . Alcohol use: Yes    Comment: every other day-glass of wine, 2-3 beers a day  . Drug use: No    Home Medications Prior to Admission medications   Medication Sig Start Date End Date Taking? Authorizing Provider  ADDERALL XR 20 MG 24  hr capsule Take 20 mg by mouth every morning. 12/21/19   [provider]  BELBUCA 450 MCG FILM Place 450 mcg under the tongue 2 (two) times daily.  12/25/19   [provider]  cholecalciferol (VITAMIN D) 1000 units tablet Take 1,000 Units by mouth daily.     [provider]  clonazePAM (KLONOPIN) 0.5 MG tablet Take 0.5 mg by mouth 2 (two) times daily as needed for anxiety. 12/21/19   [provider]  hydrOXYzine (VISTARIL) 25 MG capsule Take 25 mg by mouth 2 (two) times daily as needed. 11/18/19   [provider]  lisinopril-hydrochlorothiazide (PRINZIDE,ZESTORETIC) 20-12.5 MG tablet Take 1 tablet by mouth daily.    [provider]  meclizine (ANTIVERT) 25 MG tablet Take 1  tablet (25 mg total) by mouth 3 (three) times daily as needed for dizziness. 07/24/19   Horton, Mayer Masker, MD  methocarbamol (ROBAXIN) 500 MG tablet Take 1 tablet (500 mg total) by mouth 3 (three) times daily. Patient not taking: Reported on 01/11/2020 11/16/17   Mayo, Baxter Kail, PA-C  metoprolol succinate (TOPROL-XL) 25 MG 24 hr tablet Take 25 mg by mouth daily.    [provider]  Multiple Vitamin (MULTIVITAMIN WITH MINERALS) TABS tablet Take 1 tablet by mouth daily.    [provider]  omeprazole (PRILOSEC) 20 MG capsule Take 20 mg by mouth daily. 11/26/19   [provider]  ondansetron (ZOFRAN ODT) 4 MG disintegrating tablet Take 1 tablet (4 mg total) by mouth every 8 (eight) hours as needed for nausea or vomiting. 07/24/19   Horton, Mayer Masker, MD  oxyCODONE-acetaminophen (PERCOCET) 10-325 MG tablet Take 1 tablet by mouth 4 (four) times daily. 12/25/19   [provider]  polyethylene glycol (MIRALAX / GLYCOLAX) 17 g packet Take 17 g by mouth daily. 01/14/20   Narda Bonds, MD  VIIBRYD 20 MG TABS Take 20 mg by mouth daily.  11/22/19   [provider]    Allergies    Patient has no known allergies.  Review of Systems   Review of Systems  Constitutional: Negative for chills and fever.  Eyes: Negative for visual disturbance.  Respiratory: Negative for cough and shortness of breath.   Cardiovascular: Negative for chest pain and palpitations.  Gastrointestinal: Negative for abdominal pain and nausea.  Genitourinary: Negative for difficulty urinating and hematuria.  Musculoskeletal: Negative for myalgias.  Skin: Negative for rash.  Neurological: Positive for dizziness, tremors and seizures. Negative for speech difficulty and weakness.  Psychiatric/Behavioral: Positive for confusion.    Physical Exam Updated Vital Signs SpO2 96%   Physical Exam Constitutional:      General: He is not in acute distress. HENT:     Head: Normocephalic and  atraumatic.     Nose: Nose normal.     Mouth/Throat:     Mouth: Mucous membranes are moist.     Pharynx: Oropharynx is clear.  Eyes:     Extraocular Movements: Extraocular movements intact.     Pupils: Pupils are equal, round, and reactive to light.  Cardiovascular:     Rate and Rhythm: Regular rhythm. Tachycardia present.     Pulses: Normal pulses.     Heart sounds: Normal heart sounds.  Pulmonary:     Effort: Pulmonary effort is normal.     Breath sounds: Normal breath sounds. No wheezing or rales.  Abdominal:     General: Abdomen is flat. Bowel sounds are normal. There is no distension.     Tenderness:  There is no abdominal tenderness.  Musculoskeletal:        General: No tenderness or deformity. Normal range of motion.     Cervical back: Normal range of motion.  Skin:    General: Skin is warm and dry.     Capillary Refill: Capillary refill takes less than 2 seconds.     Findings: No bruising or rash.  Neurological:     Mental Status: He is alert and oriented to person, place, and time.     Comments: 5/5 strength in all extremities. Sensation decrease bilateral lower extremities. Abnormal finger-to-nose testing, left worse than right. Noticeable resting tremor in both hands.  Psychiatric:        Mood and Affect: Mood normal.     ED Results / Procedures / Treatments   Labs (all labs ordered are listed, but only abnormal results are displayed) Labs Reviewed  CBC WITH DIFFERENTIAL/PLATELET - Abnormal; Notable for the following components:      Result Value   RBC 3.16 (*)    Hemoglobin 11.1 (*)    HCT 33.6 (*)    MCV 106.3 (*)    MCH 35.1 (*)    Platelets 117 (*)    Monocytes Absolute 1.4 (*)    Abs Immature Granulocytes 0.08 (*)    All other components within normal limits  COMPREHENSIVE METABOLIC PANEL - Abnormal; Notable for the following components:   Sodium 127 (*)    Chloride 92 (*)    CO2 15 (*)    Glucose, Bld 66 (*)    Creatinine, Ser 2.13 (*)    Calcium  7.9 (*)    AST 79 (*)    ALT 46 (*)    GFR, Estimated 36 (*)    Anion gap 20 (*)    All other components within normal limits  CBG MONITORING, ED - Abnormal; Notable for the following components:   Glucose-Capillary 66 (*)    All other components within normal limits  ETHANOL  RAPID URINE DRUG SCREEN, HOSP PERFORMED    EKG EKG Interpretation  Date/Time:  Friday September 12 2020 17:34:25 EST Ventricular Rate:  96 PR Interval:    QRS Duration: 89 QT Interval:  347 QTC Calculation: 437 R Axis:   12 Text Interpretation: Sinus rhythm Ventricular premature complex Aberrant conduction of SV complex(es) Probable left atrial enlargement Low voltage, precordial leads Abnormal R-wave progression, early transition poor baseline Confirmed by Richardean CanalYao, David H [phone]) on 09/12/2020 6:40:00 PM   Radiology CT Head Wo Contrast  Result Date: 09/12/2020 CLINICAL DATA:  Seizure. EXAM: CT HEAD WITHOUT CONTRAST TECHNIQUE: Contiguous axial images were obtained from the base of the skull through the vertex without intravenous contrast. COMPARISON:  January 11, 2020 FINDINGS: Brain: No evidence of acute infarction, hemorrhage, hydrocephalus, extra-axial collection or mass lesion/mass effect. Vascular: No hyperdense vessel or unexpected calcification. Skull: Normal. Negative for fracture or focal lesion. Sinuses/Orbits: No acute finding. Other: None. IMPRESSION: No acute intracranial abnormalities. No cause for seizure identified. Electronically Signed   By: Gerome Samavid  Williams III M.D   On: 09/12/2020 19:51   DG CHEST PORT 1 VIEW  Result Date: 09/12/2020 CLINICAL DATA:  Seizure. EXAM: PORTABLE CHEST 1 VIEW COMPARISON:  November 15, 2017 FINDINGS: The heart, hila, and mediastinum are normal. No pneumothorax. Mild bibasilar opacities. No other acute abnormalities. IMPRESSION: Mild bibasilar opacities may represent atelectasis, pneumonia, or aspiration. Recommend clinical correlation with short-term follow-up to ensure  resolution. Electronically Signed   By: Gerome Samavid  Williams III M.D  On: 09/12/2020 21:27    Procedures Procedures (including critical care time)  Medications Ordered in ED Medications  LORazepam (ATIVAN) tablet 1-4 mg (has no administration in time range)    Or  LORazepam (ATIVAN) injection 1-4 mg (has no administration in time range)  thiamine tablet 100 mg ( Oral See Alternative 09/12/20 1932)    Or  thiamine (B-1) injection 100 mg (100 mg Intravenous Given 09/12/20 1932)  folic acid (FOLVITE) tablet 1 mg (1 mg Oral Given 09/12/20 1932)  multivitamin with minerals tablet 1 tablet (1 tablet Oral Given 09/12/20 1932)  LORazepam (ATIVAN) injection 0-4 mg (4 mg Intravenous Given 09/13/20 0035)    Followed by  LORazepam (ATIVAN) injection 0-4 mg (has no administration in time range)  dexmedetomidine (PRECEDEX) 400 MCG/100ML (4 mcg/mL) infusion (0.8 mcg/kg/hr  74.8 kg Intravenous Rate/Dose Verify 09/13/20 0100)  docusate sodium (COLACE) capsule 100 mg (has no administration in time range)  polyethylene glycol (MIRALAX / GLYCOLAX) packet 17 g (has no administration in time range)  heparin injection 5,000 Units (has no administration in time range)  pantoprazole (PROTONIX) EC tablet 40 mg (40 mg Oral Not Given 09/12/20 2246)  cefTRIAXone (ROCEPHIN) 2 g in sodium chloride 0.9 % 100 mL IVPB (has no administration in time range)  Chlorhexidine Gluconate Cloth 2 % PADS 6 each (has no administration in time range)  MEDLINE mouth rinse (has no administration in time range)  sodium chloride 0.9 % bolus 1,000 mL (0 mLs Intravenous Stopped 09/12/20 1900)  LORazepam (ATIVAN) injection 2 mg (2 mg Intravenous Given 09/12/20 2013)  diazepam (VALIUM) injection 5 mg (5 mg Intravenous Given 09/12/20 2024)  magnesium sulfate IVPB 2 g 50 mL (0 g Intravenous Stopped 09/12/20 2155)  dexmedetomidine (PRECEDEX) bolus via infusion 74.8 mcg (74.8 mcg Intravenous Bolus from Bag 09/12/20 2049)  sodium chloride 0.9 %  bolus 1,000 mL (0 mLs Intravenous Stopped 09/12/20 2224)  lactated ringers bolus 500 mL (0 mLs Intravenous Stopped 09/12/20 2352)  lactated ringers bolus 500 mL (500 mLs Intravenous New Bag/Given 09/12/20 2351)    ED Course  I have reviewed the triage vital signs and the nursing notes.  Pertinent labs & imaging results that were available during my care of the patient were reviewed by me and considered in my medical decision making (see chart for details).    MDM Rules/Calculators/A&P                          Patient is a 54 yo with hx of vertigo and questionable seizure who presents to the ED after a witnessed seizure at home. Per wife and patient, last alcohol use was last night in which patient drank 2 beers. Patient confused after the incident but back to baseline mental status on arrival. Patient with mild tachycardia but otherwise hemodynamically stable. Exam showed normal strength, decreased sensation in the lower extremities and resting tremors in both arms. CBC showed mild anemia and thrombocytopenia. BMP showed AKI with with hyponatremia. CBG was low at 66, UDS positive for opioids but lactic acid, ethanol level and troponins were all normal. Other lab findings include hypomagnesemia and hypothyroidism. EKG showed sinus rhythm with possible LAE and abnormal R wave progression. CXR showed mild bibasilar opacities likely secondary to atelectasis, pneumonia or aspiration. CT head was negative for intracranial abnormalities.  Patient CBG improved to 80 with an apple juice. Magnesium repleted with 2 g of mag sulfate IV. Patient started on CIWA protocol with Ativan after  first dose of IV Ativan, patient started having worsening tremors with diaphoresis, agitation, and hallucinations. Patient was given a second dose of IV Ativan 2 mg with no improvement in agitation and tremors. After no improvements in agitation and tremors with Valium IV 5 mg, patient was started on Precedex. Patient significant  improvement in agitation and tremors following Precedex bolus and critical care were consulted for further assessment and management. Patient's presentation likely multifactorial in nature. Patient seizure could be due to hypoglycemia, alcohol withdrawal or hypomagnesemia and hyponatremia 2/2 beer potomania. Electrolyte derangements, seizures, hallucinations, diaphoresis and tachycardia all consistent with possible alcohol withdrawal however history of last alcohol use inconclusive for this diagnosis. Admitting patient to critical care for further evaluation and management of acute encephalopathy. Final Clinical Impression(s) / ED Diagnoses Final diagnoses:  Alcohol withdrawal seizure with delirium [facility])    Rx / DC Orders ED Discharge Orders    None       Steffanie Rainwater, MD 09/13/20 0144    Charlynne Pander, MD 09/13/20 [phone]

## 2020-09-12 NOTE — ED Notes (Signed)
Patient becoming very agitated and hallucinating. Pt very altered and tremors have increased. Providers have been made aware.

## 2020-09-12 NOTE — ED Notes (Signed)
Meier MD made aware of patients low BP.

## 2020-09-12 NOTE — Plan of Care (Signed)
Persistently low BP despite crystalloid and lowering dose of precedex. CXR with possible pneumonia. Will give another 500cc LR, get blood cultures, and start ceftriaxone in setting likely aspiration.

## 2020-09-12 NOTE — H&P (Addendum)
NAME:  Christopher Underwood, MRN:  [id number], DOB:  1966-03-07, LOS: 0 ADMISSION DATE:  09/12/2020, CONSULTATION DATE:  09/12/20 REFERRING MD:  Silverio Lay, CHIEF COMPLAINT: new onset seizure, confusion  Brief History   54yM with history of chronic pain and opioid dependency, alcohol use with chart history of withdrawal who is admitted with seizure and acute encephalopathy, suspected alcohol withdrawal  History of present illness   54yM with history of chronic pain and opioid dependency, alcohol use with chart history of withdrawal who had witnessed seizure earlier today in company of his wife [name] around 1730. The history is obtained from chart review and from the patient's wife as he is too drowsy to participate in the interview at time of my evaluation. This was generalized, convulsive, lasted maybe a minute or two and broke. He was confused afterward. No further seizure/seizure-like activity with EMS or in the ED. This is the first such episode he's ever had. The patient's wife reports no recent change in his medications or abrupt change in quantity of alcohol use (although she says she is unaware generally how much he drinks).   In the ED he received a couple doses of lorazepam, 5 mg of valium and he was started on precedex gtt. Neurology was curbsided by hospitalist team recommending EEG and correction of electrolytes, treatment of suspected alcohol withdrawal seizure.  Past Medical History  Chronic pain Chronic opioid use  Alcohol use Depression  Significant Hospital Events   11/19 admitted  Consults:  11/19 PCCM  Procedures:  None  Significant Diagnostic Tests:  11/19 CTH unremarkable   Micro Data:  None  Antimicrobials:  None  Interim history/subjective:  n/a  Objective   Blood pressure (!) 136/115, pulse 89, temperature 99.6 F (37.6 C), temperature source Oral, resp. rate (!) 21, weight 74.8 kg, SpO2 95 %.       No intake or output data in the 24 hours ending 09/12/20  2058 Filed Weights   09/12/20 2000  Weight: 74.8 kg    Examination: General: drowsy but responsive to physical stimulus  HENT: NCAT, dry MM, pupils are 37mm equal and slowly reactive Lungs: CTAB, normal work of breathing Cardiovascular: RRR, no murmur, no JVD  Abdomen: soft, nontender, normal bowel sounds Extremities: warm, well-perfused without cyanosis, edema Neuro: grossly nonfocal, can follow commands, no clonus  Resolved Hospital Problem list   n/a  Assessment & Plan:   # Acute encephalopathy: # Seizure: Suspected alcohol withdrawal based on chart history of withdrawal and pattern of electrolyte abnormalities although history obtained from wife inconclusive for this.  - precedex - CIWA  - thiamine/folate  - electrolyte correction - if recurrent seizure activity not responsive to benzodiazepine, status, or if EEG remarkable will reinvolve neuro  # AKI - f/u response to crystalloids  # Anion gap metabolic acidosis: - check lactic, osmolar gap  # Hypomagnesemia - correction as above  # Chronic opioid use: - will hold for now as he appears quite sedated after uptitration of precedex drip, but will need to start an opioid, some robaxin at some point in morning to prevent withdrawal  # Anemia: # Thrombocytopenia: - check B12 - liver ultrasound - HIV negative earlier this year, hepatitis labs negative around time of onset  Best practice:  Diet: NPO for now  Pain/Anxiety/Delirium protocol (if indicated): not indicated VAP protocol (if indicated): not indicated DVT prophylaxis: sq heparin GI prophylaxis: not indicated  Glucose control: q4 checks Mobility: bed level Code Status: Full Family Communication:  updated at bedside Disposition: ICU  Labs   CBC: Recent Labs  Lab 09/12/20 1740  WBC 7.5  NEUTROABS 4.2  HGB 11.1*  HCT 33.6*  MCV 106.3*  PLT 117*    Basic Metabolic Panel: Recent Labs  Lab 09/12/20 1740 09/12/20 1856  NA 127*  --   K 3.9  --    CL 92*  --   CO2 15*  --   GLUCOSE 66*  --   BUN 19  --   CREATININE 2.13*  --   CALCIUM 7.9*  --   MG  --  0.8*  PHOS  --  4.3   GFR: CrCl cannot be calculated (Unknown ideal weight.). Recent Labs  Lab 09/12/20 1740  WBC 7.5    Liver Function Tests: Recent Labs  Lab 09/12/20 1740  AST 79*  ALT 46*  ALKPHOS 76  BILITOT 0.9  PROT 7.2  ALBUMIN 3.6   No results for input(s): LIPASE, AMYLASE in the last 168 hours. No results for input(s): AMMONIA in the last 168 hours.  ABG    Component Value Date/Time   HCO3 25.1 (H) 06/18/2016 1905   TCO2 30 01/11/2020 2036   O2SAT 73.8 06/18/2016 1905     Coagulation Profile: No results for input(s): INR, PROTIME in the last 168 hours.  Cardiac Enzymes: No results for input(s): CKTOTAL, CKMB, CKMBINDEX, TROPONINI in the last 168 hours.  HbA1C: No results found for: HGBA1C  CBG: Recent Labs  Lab 09/12/20 1846 09/12/20 2012  GLUCAP 66* 80    Review of Systems:   unable to obtain due to encephalopathy  Past Medical History  He,  has a past medical history of Arthritis, Attention deficit disorder, Depression, and Hypertension.   Surgical History    Past Surgical History:  Procedure Laterality Date  . cervical fusion with plating x 2  2000  . left knee arthroscopy  1996   x 2  . lower back surgery  2011  . LUMBAR LAMINECTOMY/DECOMPRESSION MICRODISCECTOMY Left 11/16/2017   Procedure: LUMBAR LAMINECTOMY/DECOMPRESSION MICRODISCECTOMY;  Surgeon: Venita Lick, MD;  Location: [facility] OR;  Service: Orthopedics;  Laterality: Left;  . right arm surgery for donor skin graft  2000  . right foot surgery to clean out arthritis  2011  . right heel reconstruction  2000  . right heel surgery  to remove screw  2000  . right heel surgery for fx  2010  . right heel wound I and D for infection   2011     Social History   reports that he quit smoking about 36 years ago. His smoking use included cigarettes. He has a 0.25 pack-year  smoking history. He has never used smokeless tobacco. He reports current alcohol use. He reports that he does not use drugs.   Family History   His family history includes Diabetes in an other family member; Heart attack in an other family member; Hypertension in an other family member.   Allergies No Known Allergies   Home Medications  Prior to Admission medications   Medication Sig Start Date End Date Taking? Authorizing Provider  ADDERALL XR 20 MG 24 hr capsule Take 20 mg by mouth every morning. 12/21/19   [provider]  BELBUCA 450 MCG FILM Place 450 mcg under the tongue 2 (two) times daily.  12/25/19   [provider]  cholecalciferol (VITAMIN D) 1000 units tablet Take 1,000 Units by mouth daily.     [provider]  clonazePAM (KLONOPIN) 0.5 MG  tablet Take 0.5 mg by mouth 2 (two) times daily as needed for anxiety. 12/21/19   [provider]  hydrOXYzine (VISTARIL) 25 MG capsule Take 25 mg by mouth 2 (two) times daily as needed. 11/18/19   [provider]  lisinopril-hydrochlorothiazide (PRINZIDE,ZESTORETIC) 20-12.5 MG tablet Take 1 tablet by mouth daily.    [provider]  meclizine (ANTIVERT) 25 MG tablet Take 1 tablet (25 mg total) by mouth 3 (three) times daily as needed for dizziness. 07/24/19   Horton, Mayer Masker, MD  methocarbamol (ROBAXIN) 500 MG tablet Take 1 tablet (500 mg total) by mouth 3 (three) times daily. Patient not taking: Reported on 01/11/2020 11/16/17   Mayo, Baxter Kail, PA-C  metoprolol succinate (TOPROL-XL) 25 MG 24 hr tablet Take 25 mg by mouth daily.    [provider]  Multiple Vitamin (MULTIVITAMIN WITH MINERALS) TABS tablet Take 1 tablet by mouth daily.    [provider]  omeprazole (PRILOSEC) 20 MG capsule Take 20 mg by mouth daily. 11/26/19   [provider]  ondansetron (ZOFRAN ODT) 4 MG disintegrating tablet Take 1 tablet (4 mg total) by mouth every 8 (eight) hours as needed  for nausea or vomiting. 07/24/19   Horton, Mayer Masker, MD  oxyCODONE-acetaminophen (PERCOCET) 10-325 MG tablet Take 1 tablet by mouth 4 (four) times daily. 12/25/19   [provider]  polyethylene glycol (MIRALAX / GLYCOLAX) 17 g packet Take 17 g by mouth daily. 01/14/20   Narda Bonds, MD  VIIBRYD 20 MG TABS Take 20 mg by mouth daily.  11/22/19   [provider]     Critical care time: 30    This patient is critically ill with acute encephalopathy; which, requires frequent high complexity decision making, assessment, support, evaluation, and titration of therapies. This was completed through the application of advanced monitoring technologies and extensive interpretation of multiple databases. During this encounter critical care time was devoted to patient care services described in this note for 30 minutes.  Vida Roller, Pulmonary/Critical Care

## 2020-09-12 NOTE — ED Triage Notes (Signed)
Pt BIBEMS for seizures. Pt had his first seizure about 3 weeks ago. Per friend, pt had a seizure today and she couldn't get him to stop so she called ems. States he has been having increased tremors x2 days and decreased coordination for about a day. When ems arrived pt was post ictal, AxOx2/diaphoretic/hypotensive with a  Systolic in the 90s. Pt is now AxOx4. Pt currently unable to sit still with noticeable tremors.

## 2020-09-13 ENCOUNTER — Inpatient Hospital Stay (HOSPITAL_COMMUNITY): Payer: Medicaid Other

## 2020-09-13 ENCOUNTER — Encounter (HOSPITAL_COMMUNITY): Payer: Self-pay | Admitting: Student

## 2020-09-13 DIAGNOSIS — R569 Unspecified convulsions: Secondary | ICD-10-CM | POA: Diagnosis not present

## 2020-09-13 DIAGNOSIS — G9340 Encephalopathy, unspecified: Secondary | ICD-10-CM | POA: Diagnosis not present

## 2020-09-13 DIAGNOSIS — N179 Acute kidney failure, unspecified: Secondary | ICD-10-CM | POA: Diagnosis not present

## 2020-09-13 DIAGNOSIS — F10231 Alcohol dependence with withdrawal delirium: Secondary | ICD-10-CM | POA: Diagnosis not present

## 2020-09-13 LAB — HEPATIC FUNCTION PANEL
ALT: 38 U/L (ref 0–44)
AST: 54 U/L — ABNORMAL HIGH (ref 15–41)
Albumin: 2.7 g/dL — ABNORMAL LOW (ref 3.5–5.0)
Alkaline Phosphatase: 66 U/L (ref 38–126)
Bilirubin, Direct: 0.1 mg/dL (ref 0.0–0.2)
Indirect Bilirubin: 1.7 mg/dL — ABNORMAL HIGH (ref 0.3–0.9)
Total Bilirubin: 1.8 mg/dL — ABNORMAL HIGH (ref 0.3–1.2)
Total Protein: 5.4 g/dL — ABNORMAL LOW (ref 6.5–8.1)

## 2020-09-13 LAB — GLUCOSE, CAPILLARY
Glucose-Capillary: 73 mg/dL (ref 70–99)
Glucose-Capillary: 73 mg/dL (ref 70–99)
Glucose-Capillary: 76 mg/dL (ref 70–99)
Glucose-Capillary: 77 mg/dL (ref 70–99)
Glucose-Capillary: 83 mg/dL (ref 70–99)
Glucose-Capillary: 83 mg/dL (ref 70–99)
Glucose-Capillary: 84 mg/dL (ref 70–99)

## 2020-09-13 LAB — CBC
HCT: 28.7 % — ABNORMAL LOW (ref 39.0–52.0)
Hemoglobin: 9.7 g/dL — ABNORMAL LOW (ref 13.0–17.0)
MCH: 35.5 pg — ABNORMAL HIGH (ref 26.0–34.0)
MCHC: 33.8 g/dL (ref 30.0–36.0)
MCV: 105.1 fL — ABNORMAL HIGH (ref 80.0–100.0)
Platelets: 85 10*3/uL — ABNORMAL LOW (ref 150–400)
RBC: 2.73 MIL/uL — ABNORMAL LOW (ref 4.22–5.81)
RDW: 11.5 % (ref 11.5–15.5)
WBC: 5.6 10*3/uL (ref 4.0–10.5)
nRBC: 0 % (ref 0.0–0.2)

## 2020-09-13 LAB — BLOOD GAS, VENOUS
Acid-base deficit: 6.5 mmol/L — ABNORMAL HIGH (ref 0.0–2.0)
Bicarbonate: 19.1 mmol/L — ABNORMAL LOW (ref 20.0–28.0)
Drawn by: 3468
FIO2: 99
O2 Saturation: 87.7 %
Patient temperature: 37
pCO2, Ven: 42.5 mmHg — ABNORMAL LOW (ref 44.0–60.0)
pH, Ven: 7.276 (ref 7.250–7.430)
pO2, Ven: 62.9 mmHg — ABNORMAL HIGH (ref 32.0–45.0)

## 2020-09-13 LAB — BASIC METABOLIC PANEL
Anion gap: 11 (ref 5–15)
BUN: 18 mg/dL (ref 6–20)
CO2: 20 mmol/L — ABNORMAL LOW (ref 22–32)
Calcium: 7.3 mg/dL — ABNORMAL LOW (ref 8.9–10.3)
Chloride: 101 mmol/L (ref 98–111)
Creatinine, Ser: 1.49 mg/dL — ABNORMAL HIGH (ref 0.61–1.24)
GFR, Estimated: 55 mL/min — ABNORMAL LOW (ref >60)
Glucose, Bld: 78 mg/dL (ref 70–99)
Potassium: 3.6 mmol/L (ref 3.5–5.1)
Sodium: 132 mmol/L — ABNORMAL LOW (ref 135–145)

## 2020-09-13 LAB — MRSA PCR SCREENING: MRSA by PCR: NEGATIVE

## 2020-09-13 LAB — VITAMIN B12: Vitamin B-12: 176 pg/mL — ABNORMAL LOW (ref 180–914)

## 2020-09-13 LAB — LACTIC ACID, PLASMA: Lactic Acid, Venous: 0.7 mmol/L (ref 0.5–1.9)

## 2020-09-13 LAB — PHOSPHORUS: Phosphorus: 4.5 mg/dL (ref 2.5–4.6)

## 2020-09-13 LAB — ALBUMIN: Albumin: 2.8 g/dL — ABNORMAL LOW (ref 3.5–5.0)

## 2020-09-13 LAB — MAGNESIUM: Magnesium: 1.5 mg/dL — ABNORMAL LOW (ref 1.7–2.4)

## 2020-09-13 MED ORDER — PANTOPRAZOLE SODIUM 40 MG IV SOLR
40.0000 mg | INTRAVENOUS | Status: DC
  Administered 2020-09-13 – 2020-09-14 (×2): 40 mg via INTRAVENOUS
  Filled 2020-09-13 (×2): qty 40

## 2020-09-13 MED ORDER — ORAL CARE MOUTH RINSE
15.0000 mL | Freq: Two times a day (BID) | OROMUCOSAL | Status: DC
  Administered 2020-09-13 – 2020-09-16 (×8): 15 mL via OROMUCOSAL

## 2020-09-13 MED ORDER — SODIUM CHLORIDE 0.9 % IV SOLN
1.0000 mg | Freq: Every day | INTRAVENOUS | Status: DC
  Filled 2020-09-13: qty 0.2

## 2020-09-13 MED ORDER — LORAZEPAM 2 MG/ML IJ SOLN
2.0000 mg | INTRAMUSCULAR | Status: DC | PRN
  Administered 2020-09-14: 2 mg via INTRAVENOUS
  Filled 2020-09-13: qty 1

## 2020-09-13 MED ORDER — LORAZEPAM 2 MG/ML IJ SOLN
2.0000 mg | INTRAMUSCULAR | Status: DC
  Administered 2020-09-13 – 2020-09-14 (×5): 2 mg via INTRAVENOUS
  Filled 2020-09-13 (×5): qty 1

## 2020-09-13 MED ORDER — LORAZEPAM 2 MG/ML IJ SOLN: 2.0000 mg | INTRAMUSCULAR | Status: DC

## 2020-09-13 MED ORDER — SODIUM CHLORIDE 0.9 % IV SOLN
1.0000 mg | Freq: Once | INTRAVENOUS | Status: AC
  Administered 2020-09-13: 1 mg via INTRAVENOUS
  Filled 2020-09-13: qty 0.2

## 2020-09-13 MED ORDER — FOLIC ACID 5 MG/ML IJ SOLN
1.0000 mg | Freq: Every day | INTRAMUSCULAR | Status: DC
  Administered 2020-09-14: 1 mg via INTRAVENOUS
  Filled 2020-09-13 (×2): qty 0.2

## 2020-09-13 MED ORDER — SODIUM CHLORIDE 0.9 % IV SOLN: INTRAVENOUS | Status: AC

## 2020-09-13 MED ORDER — MAGNESIUM SULFATE 4 GM/100ML IV SOLN
4.0000 g | Freq: Once | INTRAVENOUS | Status: AC
  Administered 2020-09-13: 4 g via INTRAVENOUS
  Filled 2020-09-13: qty 100

## 2020-09-13 MED ORDER — CHLORHEXIDINE GLUCONATE CLOTH 2 % EX PADS
6.0000 | MEDICATED_PAD | Freq: Every day | CUTANEOUS | Status: DC
  Administered 2020-09-13 (×2): 6 via TOPICAL

## 2020-09-13 NOTE — Progress Notes (Signed)
eLink Physician-Brief Progress Note Patient Name: Christopher Underwood DOB: 10/14/1966 MRN: [id number]   Date of Service  09/13/2020  HPI/Events of Note  Patient admitted via the ED following a witnessed tonic-clonic seizure against a background of known ETOH and narcotic abuse history, patient has altered mental status consistent with a post-ictal state, CT head was unremarkable.  eICU Interventions  New Patient Evaluation completed, CIWA protocol (including Thiamine  + MVI replacement), EEG, ? MRI, Neurology consultation.        [name] 09/13/2020, 1:14 AM

## 2020-09-13 NOTE — Progress Notes (Signed)
Received patient from ED with two pairs of glasses and a smart watch. Patient personal belongings were put into a bag and secured in patient room. Significant other, Raynelle Fanning, updated on patients transfer and current condition in the ICU. All questions answered at this time.   Barbaraann Cao, RN  09/13/20 [phone]

## 2020-09-13 NOTE — Procedures (Signed)
Patient Name: Christopher Underwood  MRN: [id number]  Epilepsy Attending: Charlsie Quest  Referring Physician/Provider: Dr Chaney Malling Date: 09/13/2020 Duration: 27.34 mins  Patient history: 54yo M with h/o etoh and narcotic abuse presented with GTC seizure. EEG to evaluate for seizure.  Level of alertness: Awake, asleep  AEDs during EEG study: Ativan  Technical aspects: This EEG study was done with scalp electrodes positioned according to the 10-20 International system of electrode placement. Electrical activity was acquired at a sampling rate of 500Hz  and reviewed with a high frequency filter of 70Hz  and a low frequency filter of 1Hz . EEG data were recorded continuously and digitally stored.   Description: No posterior dominant rhythm was seen. Sleep was characterized by vertex waves, sleep spindles (12 to 14 Hz), maximal frontocentral region. There is an excessive amount of 15 to 18 Hz beta activity distributed symmetrically and diffusely. Hyperventilation and photic stimulation were not performed.     ABNORMALITY -Excessive beta, generalized  IMPRESSION: This study is within normal limits. The excessive beta activity seen in the background is most likely due to the effect of benzodiazepine and is a benign EEG pattern. No seizures or epileptiform discharges were seen throughout the recording.  Jaymi Tinner 

## 2020-09-13 NOTE — Progress Notes (Signed)
EEG complete - results pending 

## 2020-09-13 NOTE — Progress Notes (Addendum)
NAME:  Christopher Underwood, MRN:  [id number], DOB:  [birth year], LOS: 1 ADMISSION DATE:  09/12/2020, CONSULTATION DATE:  09/12/20 REFERRING MD:  Silverio Lay, CHIEF COMPLAINT: new onset seizure, confusion  Brief History   54yM with history of chronic pain and opioid dependency, alcohol use with chart history of withdrawal who is admitted with seizure and acute encephalopathy, suspected alcohol withdrawal  History of present illness   54yM with history of chronic pain and opioid dependency, alcohol use with chart history of withdrawal who had witnessed seizure earlier today in company of his wife Ms Christopher Underwood around 1730. The history is obtained from chart review and from the patient's wife as he is too drowsy to participate in the interview at time of my evaluation. This was generalized, convulsive, lasted maybe a minute or two and broke. He was confused afterward. No further seizure/seizure-like activity with EMS or in the ED. This is the first such episode he's ever had. The patient's wife reports no recent change in his medications or abrupt change in quantity of alcohol use (although she says she is unaware generally how much he drinks).   In the ED he received a couple doses of lorazepam, 5 mg of valium and he was started on precedex gtt. Neurology was curbsided by hospitalist team recommending EEG and correction of electrolytes, treatment of suspected alcohol withdrawal seizure.  Past Medical History  Chronic pain Chronic opioid use  Alcohol use Depression  Significant Hospital Events   11/19 admitted  Consults:  11/19 PCCM  Procedures:  None  Significant Diagnostic Tests:  11/19 CTH unremarkable   Micro Data:  Blood culture 11/20 >>  Antimicrobials:  Ceftriaxone 11/19   Interim history/subjective:  Just received ativan for agitation, now resting. Moves to sternal rub, states no sternal rub.   Objective   Blood pressure 113/80, pulse 61, temperature (!) 96.2 F (35.7 C), temperature  source Axillary, resp. rate 18, weight 74.8 kg, SpO2 93 %.    FiO2 (%):  [32 %] 32 %   Intake/Output Summary (Last 24 hours) at 09/13/2020 0705 Last data filed at 09/13/2020 0600 Gross per 24 hour  Intake 128.87 ml  Output 1300 ml  Net -1171.13 ml   Filed Weights   09/12/20 2000  Weight: 74.8 kg    Examination:  General: drowsy but responsive to physical stimulus  HENT: NCAT, dry MM, pupils are 63mm equal and slowly reactive Lungs: CTAB, normal work of breathing, 97% on 2L Cardiovascular: RRR, no murmur, no JVD  Abdomen: soft, nontender, normal bowel sounds Extremities: warm, well-perfused without cyanosis, edema Neuro: sedated, no following commands but arouses slightly to sternal sub.   Resolved Hospital Problem list   n/a  Assessment & Plan:   # Acute encephalopathy: # Alcohol Withdrawal Seizure: Suspected alcohol withdrawal based on chart history of withdrawal and pattern of electrolyte abnormalities although history obtained from wife inconclusive for this. Abx started last night for low bp likely secondary to precedex/dehydation  cxr likely consistent with atelectasis, 90% on RA, lungs clear, no leukocytosis.  - wean precedex as able, increase for symptoms of withdrawal  - schedule ativan, unable to take PO  - cont. CIWA - thiamine/folate  - electrolyte correction - if recurrent seizure activity not responsive to benzodiazepine, status, or if EEG remarkable will reinvolve neuro - liver ultrasound - stop antibiotics - aspiration precautions   # AKI Improving with fluids - cont. Maintenance fluids 75cc/hr NS for 12 hours.  - trend bmp   # Hypomagnesemia -  repleting  # Chronic opioid use: - will hold for now as he appears quite sedated on precedex drip and ativan, but will need to start opioid when more awake.   #Macrocytic Anemia: # Thrombocytopenia: Likely secondary to chronic alcohol use. B12 low, MMA pending  - cont. Folate - start B12   Best  practice:  Diet: NPO for now  Pain/Anxiety/Delirium protocol (if indicated): ciwa w/ativan and precedex VAP protocol (if indicated): not indicated DVT prophylaxis: sq heparin GI prophylaxis: not indicated  Glucose control: q4 checks Mobility: bed level Code Status: Full Family Communication: updated 11/19, no answer today will try again later  Disposition: ICU  Labs   CBC: Recent Labs  Lab 09/12/20 1740 09/12/20 2248 09/13/20 0207  WBC 7.5  --  5.6  NEUTROABS 4.2  --   --   HGB 11.1* 8.2* 9.7*  HCT 33.6* 24.0* 28.7*  MCV 106.3*  --  105.1*  PLT 117*  --  85*    Basic Metabolic Panel: Recent Labs  Lab 09/12/20 1740 09/12/20 1856 09/12/20 2248 09/13/20 0207  NA 127*  --  134* 132*  K 3.9  --  3.7 3.6  CL 92*  --   --  101  CO2 15*  --   --  20*  GLUCOSE 66*  --   --  78  BUN 19  --   --  18  CREATININE 2.13*  --   --  1.49*  CALCIUM 7.9*  --   --  7.3*  MG  --  0.8*  --  1.5*  PHOS  --  4.3  --  4.5   GFR: CrCl cannot be calculated (Unknown ideal weight.). Recent Labs  Lab 09/12/20 1740 09/12/20 2241 09/13/20 0207 09/13/20 0209  WBC 7.5  --  5.6  --   LATICACIDVEN  --  0.8  --  0.7    Liver Function Tests: Recent Labs  Lab 09/12/20 1740  AST 79*  ALT 46*  ALKPHOS 76  BILITOT 0.9  PROT 7.2  ALBUMIN 3.6   Recent Labs  Lab 09/12/20 2056  LIPASE 53*   Recent Labs  Lab 09/12/20 2102  AMMONIA 42*    ABG    Component Value Date/Time   HCO3 19.1 (L) 09/13/2020 0211   TCO2 19 (L) 09/12/2020 2248   ACIDBASEDEF 6.5 (H) 09/13/2020 0211   O2SAT 87.7 09/13/2020 0211     Coagulation Profile: No results for input(s): INR, PROTIME in the last 168 hours.  Cardiac Enzymes: Recent Labs  Lab 09/12/20 2056  CKTOTAL 406*    HbA1C: No results found for: HGBA1C  CBG: Recent Labs  Lab 09/12/20 1846 09/12/20 2012 09/13/20 0022 09/13/20 0340  GLUCAP 66* 80 73 73  Daily Crate A, DO 09/13/2020, 7:05 AM Pager: [phone]

## 2020-09-14 ENCOUNTER — Other Ambulatory Visit: Payer: Self-pay

## 2020-09-14 DIAGNOSIS — G9340 Encephalopathy, unspecified: Secondary | ICD-10-CM

## 2020-09-14 DIAGNOSIS — N179 Acute kidney failure, unspecified: Secondary | ICD-10-CM | POA: Diagnosis not present

## 2020-09-14 DIAGNOSIS — F10231 Alcohol dependence with withdrawal delirium: Secondary | ICD-10-CM | POA: Diagnosis not present

## 2020-09-14 DIAGNOSIS — R569 Unspecified convulsions: Secondary | ICD-10-CM | POA: Diagnosis not present

## 2020-09-14 LAB — BASIC METABOLIC PANEL
Anion gap: 14 (ref 5–15)
BUN: 14 mg/dL (ref 6–20)
CO2: 17 mmol/L — ABNORMAL LOW (ref 22–32)
Calcium: 7.9 mg/dL — ABNORMAL LOW (ref 8.9–10.3)
Chloride: 110 mmol/L (ref 98–111)
Creatinine, Ser: 1.3 mg/dL — ABNORMAL HIGH (ref 0.61–1.24)
GFR, Estimated: >60 mL/min (ref >60)
Glucose, Bld: 86 mg/dL (ref 70–99)
Potassium: 4.4 mmol/L (ref 3.5–5.1)
Sodium: 141 mmol/L (ref 135–145)

## 2020-09-14 LAB — GLUCOSE, CAPILLARY
Glucose-Capillary: 86 mg/dL (ref 70–99)
Glucose-Capillary: 102 mg/dL — ABNORMAL HIGH (ref 70–99)
Glucose-Capillary: 65 mg/dL — ABNORMAL LOW (ref 70–99)
Glucose-Capillary: 95 mg/dL (ref 70–99)
Glucose-Capillary: 92 mg/dL (ref 70–99)

## 2020-09-14 LAB — CBC
HCT: 30.4 % — ABNORMAL LOW (ref 39.0–52.0)
Hemoglobin: 10 g/dL — ABNORMAL LOW (ref 13.0–17.0)
MCH: 35.5 pg — ABNORMAL HIGH (ref 26.0–34.0)
MCHC: 32.9 g/dL (ref 30.0–36.0)
MCV: 107.8 fL — ABNORMAL HIGH (ref 80.0–100.0)
Platelets: 96 10*3/uL — ABNORMAL LOW (ref 150–400)
RBC: 2.82 MIL/uL — ABNORMAL LOW (ref 4.22–5.81)
RDW: 11.5 % (ref 11.5–15.5)
WBC: 6.2 10*3/uL (ref 4.0–10.5)
nRBC: 0 % (ref 0.0–0.2)

## 2020-09-14 LAB — MAGNESIUM: Magnesium: 2.2 mg/dL (ref 1.7–2.4)

## 2020-09-14 MED ORDER — FOLIC ACID 1 MG PO TABS
1.0000 mg | ORAL_TABLET | Freq: Every day | ORAL | Status: DC
  Administered 2020-09-15 – 2020-09-17 (×3): 1 mg via ORAL
  Filled 2020-09-14 (×3): qty 1

## 2020-09-14 MED ORDER — LISINOPRIL 10 MG PO TABS
10.0000 mg | ORAL_TABLET | Freq: Every day | ORAL | Status: DC
  Administered 2020-09-14 – 2020-09-17 (×4): 10 mg via ORAL
  Filled 2020-09-14 (×4): qty 1

## 2020-09-14 MED ORDER — OXYCODONE HCL 5 MG PO TABS
5.0000 mg | ORAL_TABLET | Freq: Four times a day (QID) | ORAL | Status: DC | PRN
  Administered 2020-09-14 (×2): 5 mg via ORAL
  Filled 2020-09-14 (×2): qty 1

## 2020-09-14 MED ORDER — PANTOPRAZOLE SODIUM 40 MG PO TBEC
40.0000 mg | DELAYED_RELEASE_TABLET | Freq: Every day | ORAL | Status: DC
  Administered 2020-09-15 – 2020-09-17 (×3): 40 mg via ORAL
  Filled 2020-09-14 (×3): qty 1

## 2020-09-14 MED ORDER — METOPROLOL SUCCINATE ER 25 MG PO TB24
25.0000 mg | ORAL_TABLET | Freq: Every day | ORAL | Status: DC
  Administered 2020-09-14 – 2020-09-17 (×4): 25 mg via ORAL
  Filled 2020-09-14 (×4): qty 1

## 2020-09-14 MED ORDER — NICOTINE 21 MG/24HR TD PT24
21.0000 mg | MEDICATED_PATCH | Freq: Every day | TRANSDERMAL | Status: DC
  Administered 2020-09-14 – 2020-09-15 (×2): 21 mg via TRANSDERMAL
  Filled 2020-09-14 (×2): qty 1

## 2020-09-14 MED ORDER — LORAZEPAM 2 MG/ML IJ SOLN: 2.0000 mg | Freq: Four times a day (QID) | INTRAMUSCULAR | Status: DC

## 2020-09-14 MED ORDER — LORAZEPAM 2 MG/ML IJ SOLN
2.0000 mg | Freq: Three times a day (TID) | INTRAMUSCULAR | Status: DC
  Administered 2020-09-14 – 2020-09-15 (×3): 2 mg via INTRAVENOUS
  Filled 2020-09-14 (×3): qty 1

## 2020-09-14 MED ORDER — ENSURE ENLIVE PO LIQD: 237.0000 mL | Freq: Two times a day (BID) | ORAL | Status: DC

## 2020-09-14 MED ORDER — BUPRENORPHINE HCL 450 MCG BU FILM
450.0000 ug | ORAL_FILM | Freq: Two times a day (BID) | BUCCAL | Status: DC
  Administered 2020-09-14 – 2020-09-17 (×6): 450 ug via SUBLINGUAL
  Filled 2020-09-14 (×5): qty 1

## 2020-09-14 MED ORDER — OXYCODONE HCL 5 MG PO TABS
10.0000 mg | ORAL_TABLET | Freq: Four times a day (QID) | ORAL | Status: DC | PRN
  Administered 2020-09-14 – 2020-09-17 (×11): 10 mg via ORAL
  Filled 2020-09-14 (×12): qty 2

## 2020-09-14 NOTE — Progress Notes (Signed)
NAME:  Christopher Underwood, MRN:  [id number], DOB:  11/29/1965, LOS: 2 ADMISSION DATE:  09/12/2020, CONSULTATION DATE:  09/12/20 REFERRING MD:  Christopher Underwood, CHIEF COMPLAINT: new onset seizure, confusion  Brief History   54yM with history of chronic pain and opioid dependency, alcohol use with chart history of withdrawal who is admitted with seizure and acute encephalopathy, suspected alcohol withdrawal  History of present illness   54yM with history of chronic pain and opioid dependency, alcohol use with chart history of withdrawal who had witnessed seizure earlier today in company of his wife Ms Christopher Underwood around 1730. The history is obtained from chart review and from the patient's wife as he is too drowsy to participate in the interview at time of my evaluation. This was generalized, convulsive, lasted maybe a minute or two and broke. He was confused afterward. No further seizure/seizure-like activity with EMS or in the ED. This is the first such episode he's ever had. The patient's wife reports no recent change in his medications or abrupt change in quantity of alcohol use (although she says she is unaware generally how much he drinks).   In the ED he received a couple doses of lorazepam, 5 mg of valium and he was started on precedex gtt. Neurology was curbsided by hospitalist team recommending EEG and correction of electrolytes, treatment of suspected alcohol withdrawal seizure.  Past Medical History  Chronic pain Chronic opioid use  Alcohol use Depression  Significant Hospital Events   11/19 admitted  Consults:  11/19 PCCM  Procedures:  None  Significant Diagnostic Tests:  11/19 CTH unremarkable   Micro Data:  Blood culture 11/20 >>  Antimicrobials:  Ceftriaxone 11/19   Interim history/subjective:  No overnight issues. More alert around 3am this morning. Asking for diet and all home pain meds. Wife at bedside.Not interested in quitting drinking. Wants to know when he is leaving  icu.  Objective   Blood pressure (!) 148/81, pulse 63, temperature 98.4 F (36.9 C), temperature source Oral, resp. rate 15, weight 76.6 kg, SpO2 99 %.        Intake/Output Summary (Last 24 hours) at 09/14/2020 1111 Last data filed at 09/14/2020 0800 Gross per 24 hour  Intake 1199.81 ml  Output 2650 ml  Net -1450.19 ml   Filed Weights   09/12/20 2000 09/14/20 0500  Weight: 74.8 kg 76.6 kg    Examination:  General: awake alert oriented, agitated HENT: NCAT, dry MM, no facial asymmetry Lungs: CTAB, normal work of breathing, 97% on 2L Cardiovascular: RRR, no murmur, no JVD  Abdomen: soft, nontender, normal bowel sounds Extremities: warm, well-perfused without cyanosis, edema Neuro: AOx3, follows commands, no hallucinations, slight tremor  Resolved Hospital Problem list   n/a  Assessment & Plan:   # Acute encephalopathy: # Alcohol Withdrawal Seizure: Suspected alcohol withdrawal based on chart history of withdrawal - will titrate off precedex this morning.  - schedule ativan, will decrease frequency this morning.  - cont. CIWA - thiamine/folate  - no further seizures on EEG or witnessed. Continue CIWA protocol.  - patient is not interested in quitting   Chronic Opioid and Benzodiazepine dependence - continuing home oxycodone - unable to give home buprenorphone - he will have to bring from home.   # AKI Improving with fluids - uop picking up. Cr downtrending - trend bmp   # Hypomagnesemia - repleting  #Macrocytic Anemia: # Thrombocytopenia: Likely secondary to chronic alcohol use. B12 low,  - cont. Folate - start B12   Nicotine  dependence - vapes - nicotine patch  Best practice:  Diet: regular diet Pain/Anxiety/Delirium protocol (if indicated): ciwa w/ativan as above VAP protocol (if indicated): not indicated DVT prophylaxis: sq heparin GI prophylaxis: not indicated  Glucose control: q4 checks Mobility: OOB with assistance Code Status: Full Family  Communication: wife updated at bedside.  Disposition: ICU....if stable of precedex will transfer to Med surg  The patient is critically ill with multiple organ systems failure and requires high complexity decision making for assessment and support, frequent evaluation and titration of therapies, application of advanced monitoring technologies and extensive interpretation of multiple databases.   Critical Care Time devoted to patient care services described in this note is 45 minutes. This time reflects time of care of this signee Charlott Holler . This critical care time does not reflect separately billable procedures or procedure time, teaching time or supervisory time of PA/NP/Med student/Med Resident etc but could involve care discussion time.  [name] Pulmonary and Critical Care Medicine 09/14/2020 11:11 AM  Pager: [phone] After hours pager: [phone]   Labs   CBC: Recent Labs  Lab 09/12/20 1740 09/12/20 2248 09/13/20 0207 09/14/20 0116  WBC 7.5  --  5.6 6.2  NEUTROABS 4.2  --   --   --   HGB 11.1* 8.2* 9.7* 10.0*  HCT 33.6* 24.0* 28.7* 30.4*  MCV 106.3*  --  105.1* 107.8*  PLT 117*  --  85* 96*    Basic Metabolic Panel: Recent Labs  Lab 09/12/20 1740 09/12/20 1856 09/12/20 2248 09/13/20 0207 09/14/20 0116  NA 127*  --  134* 132* 141  K 3.9  --  3.7 3.6 4.4  CL 92*  --   --  101 110  CO2 15*  --   --  20* 17*  GLUCOSE 66*  --   --  78 86  BUN 19  --   --  18 14  CREATININE 2.13*  --   --  1.49* 1.30*  CALCIUM 7.9*  --   --  7.3* 7.9*  MG  --  0.8*  --  1.5* 2.2  PHOS  --  4.3  --  4.5  --    GFR: CrCl cannot be calculated (Unknown ideal weight.). Recent Labs  Lab 09/12/20 1740 09/12/20 2241 09/13/20 0207 09/13/20 0209 09/14/20 0116  WBC 7.5  --  5.6  --  6.2  LATICACIDVEN  --  0.8  --  0.7  --     Liver Function Tests: Recent Labs  Lab 09/12/20 1740 09/13/20 0813  AST 79* 54*  ALT 46* 38  ALKPHOS 76 66  BILITOT 0.9 1.8*   PROT 7.2 5.4*  ALBUMIN 3.6 2.7*  2.8*   Recent Labs  Lab 09/12/20 2056  LIPASE 53*   Recent Labs  Lab 09/12/20 2102  AMMONIA 42*    ABG    Component Value Date/Time   HCO3 19.1 (L) 09/13/2020 0211   TCO2 19 (L) 09/12/2020 2248   ACIDBASEDEF 6.5 (H) 09/13/2020 0211   O2SAT 87.7 09/13/2020 0211     Coagulation Profile: No results for input(s): INR, PROTIME in the last 168 hours.  Cardiac Enzymes: Recent Labs  Lab 09/12/20 2056  CKTOTAL 406*    HbA1C: No results found for: HGBA1C  CBG: Recent Labs  Lab 09/13/20 1524 09/13/20 2057 09/13/20 2349 09/14/20 0351 09/14/20 0831  GLUCAP 77 76 84 86 102*

## 2020-09-14 NOTE — Progress Notes (Signed)
eLink Physician-Brief Progress Note Patient Name: Christopher Underwood DOB: 1966-04-14 MRN: [id number]   Date of Service  09/14/2020  HPI/Events of Note  Patient thirsty and requests home oxycodone.  eICU Interventions  Will order: 1. NPO except sips with meds and chips. 2. Oxycodone IR 5 mg PO Q 6 hours PRN pain.      Intervention Category Major Interventions: Other:  Lenell Antu 09/14/2020, 1:37 AM

## 2020-09-14 NOTE — Plan of Care (Signed)
Patient transferred from ICU to 6N05. Alert and oriented x4. Patient oriented to room , call bell system, bed functions and dietary ordering.  Patient denies pain at this time. Request for home medication buprennorphine to be brought from home for patient administration. Significant other and patient in agreement to plan.  Problem: Education: Goal: Knowledge of General Education information will improve Description: Including pain rating scale, medication(s)/side effects and non-pharmacologic comfort measures Outcome: Progressing   Problem: Health Behavior/Discharge Planning: Goal: Ability to manage health-related needs will improve Outcome: Progressing   Problem: Clinical Measurements: Goal: Ability to maintain clinical measurements within normal limits will improve Outcome: Progressing Goal: Will remain free from infection Outcome: Progressing Goal: Diagnostic test results will improve Outcome: Progressing Goal: Respiratory complications will improve Outcome: Progressing Goal: Cardiovascular complication will be avoided Outcome: Progressing   Problem: Activity: Goal: Risk for activity intolerance will decrease Outcome: Progressing   Problem: Nutrition: Goal: Adequate nutrition will be maintained Outcome: Progressing   Problem: Coping: Goal: Level of anxiety will decrease Outcome: Progressing   Problem: Elimination: Goal: Will not experience complications related to bowel motility Outcome: Progressing Goal: Will not experience complications related to urinary retention Outcome: Progressing   Problem: Pain Managment: Goal: General experience of comfort will improve Outcome: Progressing   Problem: Safety: Goal: Ability to remain free from injury will improve Outcome: Progressing   Problem: Skin Integrity: Goal: Risk for impaired skin integrity will decrease Outcome: Progressing   Problem: Activity: Goal: Ability to tolerate increased activity will improve Outcome:  Progressing   Problem: Clinical Measurements: Goal: Ability to maintain a body temperature in the normal range will improve Outcome: Progressing   Problem: Respiratory: Goal: Ability to maintain adequate ventilation will improve Outcome: Progressing Goal: Ability to maintain a clear airway will improve Outcome: Progressing   Problem: Education: Goal: Knowledge of disease or condition will improve Outcome: Progressing Goal: Understanding of discharge needs will improve Outcome: Progressing   Problem: Health Behavior/Discharge Planning: Goal: Ability to identify changes in lifestyle to reduce recurrence of condition will improve Outcome: Progressing Goal: Identification of resources available to assist in meeting health care needs will improve Outcome: Progressing   Problem: Physical Regulation: Goal: Complications related to the disease process, condition or treatment will be avoided or minimized Outcome: Progressing   Problem: Safety: Goal: Ability to remain free from injury will improve Outcome: Progressing

## 2020-09-15 ENCOUNTER — Inpatient Hospital Stay (HOSPITAL_COMMUNITY): Payer: Medicaid Other

## 2020-09-15 DIAGNOSIS — F10231 Alcohol dependence with withdrawal delirium: Secondary | ICD-10-CM | POA: Diagnosis not present

## 2020-09-15 DIAGNOSIS — R569 Unspecified convulsions: Secondary | ICD-10-CM

## 2020-09-15 DIAGNOSIS — I10 Essential (primary) hypertension: Secondary | ICD-10-CM

## 2020-09-15 LAB — CBC
HCT: 27.8 % — ABNORMAL LOW (ref 39.0–52.0)
Hemoglobin: 9.3 g/dL — ABNORMAL LOW (ref 13.0–17.0)
MCH: 35.1 pg — ABNORMAL HIGH (ref 26.0–34.0)
MCHC: 33.5 g/dL (ref 30.0–36.0)
MCV: 104.9 fL — ABNORMAL HIGH (ref 80.0–100.0)
Platelets: 121 10*3/uL — ABNORMAL LOW (ref 150–400)
RBC: 2.65 MIL/uL — ABNORMAL LOW (ref 4.22–5.81)
RDW: 11.7 % (ref 11.5–15.5)
WBC: 5.7 10*3/uL (ref 4.0–10.5)
nRBC: 0 % (ref 0.0–0.2)

## 2020-09-15 LAB — BASIC METABOLIC PANEL
Anion gap: 11 (ref 5–15)
BUN: 9 mg/dL (ref 6–20)
CO2: 22 mmol/L (ref 22–32)
Calcium: 8.5 mg/dL — ABNORMAL LOW (ref 8.9–10.3)
Chloride: 104 mmol/L (ref 98–111)
Creatinine, Ser: 0.95 mg/dL (ref 0.61–1.24)
GFR, Estimated: >60 mL/min (ref >60)
Glucose, Bld: 124 mg/dL — ABNORMAL HIGH (ref 70–99)
Potassium: 3.6 mmol/L (ref 3.5–5.1)
Sodium: 137 mmol/L (ref 135–145)

## 2020-09-15 LAB — FOLATE: Folate: 29.7 ng/mL (ref >5.9)

## 2020-09-15 LAB — RETICULOCYTES
Immature Retic Fract: 16.8 % — ABNORMAL HIGH (ref 2.3–15.9)
RBC.: 2.78 MIL/uL — ABNORMAL LOW (ref 4.22–5.81)
Retic Count, Absolute: 65.6 10*3/uL (ref 19.0–186.0)
Retic Ct Pct: 2.4 % (ref 0.4–3.1)

## 2020-09-15 MED ORDER — LORAZEPAM 2 MG/ML IJ SOLN
1.0000 mg | Freq: Three times a day (TID) | INTRAMUSCULAR | Status: DC
  Administered 2020-09-15 (×3): 1 mg via INTRAVENOUS
  Filled 2020-09-15 (×4): qty 1

## 2020-09-15 MED ORDER — THIAMINE HCL 100 MG/ML IJ SOLN
500.0000 mg | Freq: Three times a day (TID) | INTRAVENOUS | Status: DC
  Administered 2020-09-16 – 2020-09-17 (×5): 500 mg via INTRAVENOUS
  Filled 2020-09-15 (×6): qty 5

## 2020-09-15 MED ORDER — OXYCODONE-ACETAMINOPHEN 10-325 MG PO TABS: 1.0000 | ORAL_TABLET | Freq: Four times a day (QID) | ORAL | Status: DC | PRN

## 2020-09-15 MED ORDER — NICOTINE 7 MG/24HR TD PT24
7.0000 mg | MEDICATED_PATCH | Freq: Every day | TRANSDERMAL | Status: DC
  Administered 2020-09-16 – 2020-09-17 (×2): 7 mg via TRANSDERMAL
  Filled 2020-09-15 (×2): qty 1

## 2020-09-15 MED ORDER — CYANOCOBALAMIN 1000 MCG/ML IJ SOLN
1000.0000 ug | Freq: Every day | INTRAMUSCULAR | Status: AC
  Administered 2020-09-16: 1000 ug via SUBCUTANEOUS
  Filled 2020-09-15 (×2): qty 1

## 2020-09-15 MED ORDER — THIAMINE HCL 100 MG/ML IJ SOLN: 100.0000 mg | Freq: Every day | INTRAMUSCULAR | Status: DC

## 2020-09-15 MED ORDER — THIAMINE HCL 100 MG/ML IJ SOLN: 250.0000 mg | Freq: Every day | INTRAVENOUS | Status: DC

## 2020-09-15 MED ORDER — VILAZODONE HCL 20 MG PO TABS
20.0000 mg | ORAL_TABLET | Freq: Every day | ORAL | Status: DC
  Administered 2020-09-15 – 2020-09-17 (×3): 20 mg via ORAL
  Filled 2020-09-15 (×4): qty 1

## 2020-09-15 NOTE — Progress Notes (Signed)
PROGRESS NOTE    Christopher Underwood   GHW:[id number]  DOB: 01/04/1966  DOA: 09/12/2020 PCP: Macy Mis, MD   Brief Narrative:  Christopher Underwood is a 54y/o with chronic pain, nicotine abuse (vapes), alcohol abuse, HTN, recent issues with vertigo who presented to the hospital with a seizure followed by confusion. According to noted in chart, the patient has not reduced his ETOH use or had and changes in medications other than the addition of Meclizine. The patient and the significant other both commented in the ED that he had not cut back on ETOH.   In the ED, he was noted to be tremulous and having visual hallucinations.  This was followed by agitation. After Ativan and Valium, he continued to have symptoms and then was started on Precedex. An EEG was ordered and it was suspected that he seizure was related to ETOH withdrawal.   CR 2.13, sodium 127, TSH 6.378, Opiate + on UDS, Hb 9.7, Plt 85  EEG 11/20> The excessive beta activity seen in the background is most likely due to the effect of benzodiazepine and is a benign EEG pattern. No seizures or epileptiform discharges were seen throughout the recording.  11/20> hepatic steatosis on abdominal ultrasound, unchanged mild gallbladder wall thickening likely related to hepatic dysfunction  11/21- alert- started on diet, titrated off of Precedex. Scheduled Ativan was continued but reduced in frequency.   Subjective: Restless and asking to go home repeatedly. He has no physical complaints.     Assessment & Plan:   Principal Problem:   Seizure  - with post op confusion/ agitation - the patient and wife both insist that he had not cut back on the amount of alcohol he drinks - ? If seizure was secondary to ETOH abuse itself- he was not on any new medications prior to the seizure- although med rec states he takes Clonazepam, he and his sig other state he is no longer taking this - will ask for neuro input  Active Problems:   Alcohol abuse and  withdrawal  Elevated LFTs with Hepatic steatosis  - cont Ativan IV TID but reduce dose to 1mg  TID  - he appears a little anxious about going home but no other signs of withdrawal - he states he does not need any help with quitting - cont Thiamine and Folate  AKI - Cr 2.13 when admitted - improved to normal  Hyponatremia - on admission- has improved  Macrocytic anemia - likely partly from ETOH - B12 is low at 176- start replacing with s/c B12 in hospital x 2 days- will need script for oral B12 - check folate levels  Thrombocytopenia - due to ETOH and due to B12 deficiency- improving    Hypertension - cont Lisinopril and Toprol    Chronic pain - goes to a pain clinic - cont Oxycodone- (takes percocet 10/325 Q 6 PRN but this is not available in the hospital) -cont Buprenorphine  Depression anxiety - cont Vilazodone  Nicotine abuse  - admit to vaping but not much - 7 mg Nicotine patch    Time spent in minutes: 45 DVT prophylaxis: heparin injection 5,000 Units Start: 09/12/20 2200  Code Status: Full code Family Communication: significant other Disposition Plan:  Status is: Inpatient  Remains inpatient appropriate because:treating ETOH withdrawl   Dispo: The patient is from: Home              Anticipated d/c is to: Home  Anticipated d/c date is: 2 days              Patient currently is not medically stable to d/c.   Consultants:   Admitted by PCCM Procedures:   EEG Antimicrobials:  Anti-infectives (From admission, onward)   Start     Dose/Rate Route Frequency Ordered Stop   09/12/20 2330  cefTRIAXone (ROCEPHIN) 2 g in sodium chloride 0.9 % 100 mL IVPB  Status:  Discontinued        2 g 200 mL/hr over 30 Minutes Intravenous Daily at bedtime 09/12/20 2319 09/13/20 0846       Objective: Vitals:   09/15/20 0124 09/15/20 0558 09/15/20 0601 09/15/20 0946  BP: (!) 135/94 (!) 141/90 (!) 141/90 113/74  Pulse: 94 (!) 101 98 67  Resp: 18 18 18 16    Temp: 98 F (36.7 C) 98 F (36.7 C) 98 F (36.7 C) 98.1 F (36.7 C)  TempSrc: Oral Oral Oral Oral  SpO2: 100% 97% 97% 98%  Weight:      Height:       No intake or output data in the 24 hours ending 09/15/20 1444 Filed Weights   09/12/20 2000 09/14/20 0500  Weight: 74.8 kg 76.6 kg    Examination: General exam: Appears comfortable  HEENT: PERRLA, oral mucosa moist, no sclera icterus or thrush Respiratory system: Clear to auscultation. Respiratory effort normal. Cardiovascular system: S1 & S2 heard, RRR.   Gastrointestinal system: Abdomen soft, non-tender, nondistended. Normal bowel sounds. Central nervous system: Alert and oriented. No focal neurological deficits. Extremities: No cyanosis, clubbing or edema Skin: No rashes or ulcers Psychiatry:  Mood & affect appropriate.     Data Reviewed: I have personally reviewed following labs and imaging studies  CBC: Recent Labs  Lab 09/12/20 1740 09/12/20 2248 09/13/20 0207 09/14/20 0116 09/15/20 0310  WBC 7.5  --  5.6 6.2 5.7  NEUTROABS 4.2  --   --   --   --   HGB 11.1* 8.2* 9.7* 10.0* 9.3*  HCT 33.6* 24.0* 28.7* 30.4* 27.8*  MCV 106.3*  --  105.1* 107.8* 104.9*  PLT 117*  --  85* 96* 121*   Basic Metabolic Panel: Recent Labs  Lab 09/12/20 1740 09/12/20 1856 09/12/20 2248 09/13/20 0207 09/14/20 0116 09/15/20 0310  NA 127*  --  134* 132* 141 137  K 3.9  --  3.7 3.6 4.4 3.6  CL 92*  --   --  101 110 104  CO2 15*  --   --  20* 17* 22  GLUCOSE 66*  --   --  78 86 124*  BUN 19  --   --  18 14 9   CREATININE 2.13*  --   --  1.49* 1.30* 0.95  CALCIUM 7.9*  --   --  7.3* 7.9* 8.5*  MG  --  0.8*  --  1.5* 2.2  --   PHOS  --  4.3  --  4.5  --   --    GFR: Estimated Creatinine Clearance: 91.8 mL/min (by C-G formula based on SCr of 0.95 mg/dL). Liver Function Tests: Recent Labs  Lab 09/12/20 1740 09/13/20 0813  AST 79* 54*  ALT 46* 38  ALKPHOS 76 66  BILITOT 0.9 1.8*  PROT 7.2 5.4*  ALBUMIN 3.6 2.7*  2.8*    Recent Labs  Lab 09/12/20 2056  LIPASE 53*   Recent Labs  Lab 09/12/20 2102  AMMONIA 42*   Coagulation Profile: No results for input(s): INR, PROTIME  in the last 168 hours. Cardiac Enzymes: Recent Labs  Lab 09/12/20 2056  CKTOTAL 406*   BNP (last 3 results) No results for input(s): PROBNP in the last 8760 hours. HbA1C: No results for input(s): HGBA1C in the last 72 hours. CBG: Recent Labs  Lab 09/14/20 0351 09/14/20 0831 09/14/20 1137 09/14/20 1436 09/14/20 1717  GLUCAP 86 102* 65* 95 92   Lipid Profile: No results for input(s): CHOL, HDL, LDLCALC, TRIG, CHOLHDL, LDLDIRECT in the last 72 hours. Thyroid Function Tests: Recent Labs    09/12/20 1850  TSH 6.378*   Anemia Panel: Recent Labs    09/13/20 0207  VITAMINB12 176*   Urine analysis:    Component Value Date/Time   COLORURINE YELLOW 09/12/2020 1859   APPEARANCEUR CLEAR 09/12/2020 1859   LABSPEC 1.013 09/12/2020 1859   PHURINE 5.0 09/12/2020 1859   GLUCOSEU NEGATIVE 09/12/2020 1859   HGBUR MODERATE (A) 09/12/2020 1859   BILIRUBINUR NEGATIVE 09/12/2020 1859   KETONESUR NEGATIVE 09/12/2020 1859   PROTEINUR 100 (A) 09/12/2020 1859   UROBILINOGEN 0.2 05/30/2012 1350   NITRITE NEGATIVE 09/12/2020 1859   LEUKOCYTESUR NEGATIVE 09/12/2020 1859   Sepsis Labs: @LABRCNTIP (procalcitonin:4,lacticidven:4) ) Recent Results (from the past 240 hour(s))  Respiratory Panel by RT PCR (Flu A&B, Covid) - Nasopharyngeal Swab     Status: None   Collection Time: 09/12/20  8:28 PM   Specimen: Nasopharyngeal Swab; Nasopharyngeal(NP) swabs in vial transport medium  Result Value Ref Range Status   SARS Coronavirus 2 by RT PCR NEGATIVE NEGATIVE Final    Comment: (NOTE) SARS-CoV-2 target nucleic acids are NOT DETECTED.  The SARS-CoV-2 RNA is generally detectable in upper respiratoy specimens during the acute phase of infection. The lowest concentration of SARS-CoV-2 viral copies this assay can detect is 131  copies/mL. A negative result does not preclude SARS-Cov-2 infection and should not be used as the sole basis for treatment or other patient management decisions. A negative result may occur with  improper specimen collection/handling, submission of specimen other than nasopharyngeal swab, presence of viral mutation(s) within the areas targeted by this assay, and inadequate number of viral copies (<131 copies/mL). A negative result must be combined with clinical observations, patient history, and epidemiological information. The expected result is Negative.  Fact Sheet for Patients:  09/14/20  Fact Sheet for Healthcare Providers:  https://www.moore.com/  This test is no t yet approved or cleared by the https://www.young.biz/ FDA and  has been authorized for detection and/or diagnosis of SARS-CoV-2 by FDA under an Emergency Use Authorization (EUA). This EUA will remain  in effect (meaning this test can be used) for the duration of the COVID-19 declaration under Section 564(b)(1) of the Act, 21 U.S.C. section 360bbb-3(b)(1), unless the authorization is terminated or revoked sooner.     Influenza A by PCR NEGATIVE NEGATIVE Final   Influenza B by PCR NEGATIVE NEGATIVE Final    Comment: (NOTE) The Xpert Xpress SARS-CoV-2/FLU/RSV assay is intended as an aid in  the diagnosis of influenza from Nasopharyngeal swab specimens and  should not be used as a sole basis for treatment. Nasal washings and  aspirates are unacceptable for Xpert Xpress SARS-CoV-2/FLU/RSV  testing.  Fact Sheet for Patients: [place]  Fact Sheet for Healthcare Providers: https://www.moore.com/  This test is not yet approved or cleared by the https://www.young.biz/ FDA and  has been authorized for detection and/or diagnosis of SARS-CoV-2 by  FDA under an Emergency Use Authorization (EUA). This EUA will remain  in effect (meaning  this test  can be used) for the duration of the  Covid-19 declaration under Section 564(b)(1) of the Act, 21  U.S.C. section 360bbb-3(b)(1), unless the authorization is  terminated or revoked. Performed at [facility], [address] [place], [place] [address]   Culture, blood (routine x 2)     Status: None (Preliminary result)   Collection Time: 09/12/20 11:47 PM   Specimen: BLOOD  Result Value Ref Range Status   Specimen Description BLOOD SITE NOT SPECIFIED  Final   Special Requests   Final    BOTTLES DRAWN AEROBIC AND ANAEROBIC Blood Culture adequate volume   Culture   Final    NO GROWTH 2 DAYS Performed at [facility], [address] [place], [place] [address]    Report Status PENDING  Incomplete  Culture, blood (routine x 2)     Status: None (Preliminary result)   Collection Time: 09/13/20  2:17 AM   Specimen: BLOOD  Result Value Ref Range Status   Specimen Description BLOOD LEFT HAND  Final   Special Requests   Final    BOTTLES DRAWN AEROBIC ONLY Blood Culture adequate volume   Culture   Final    NO GROWTH 2 DAYS Performed at [facility], [address] [place], [place] [address]    Report Status PENDING  Incomplete  MRSA PCR Screening     Status: None   Collection Time: 09/13/20  2:29 AM   Specimen: Nasal Mucosa; Nasopharyngeal  Result Value Ref Range Status   MRSA by PCR NEGATIVE NEGATIVE Final    Comment:        The GeneXpert MRSA Assay (FDA approved for NASAL specimens only), is one component of a comprehensive MRSA colonization surveillance program. It is not intended to diagnose MRSA infection nor to guide or monitor treatment for MRSA infections. Performed at [facility], [address] [place], [place] [address]          Radiology Studies: No results found.    Scheduled Meds: . Buprenorphine HCl  450 mcg Sublingual BID  . folic acid  1 mg Oral Daily  . heparin  5,000 Units Subcutaneous Q8H  . lisinopril  10 mg  Oral Daily  . LORazepam  1 mg Intravenous Q8H  . mouth rinse  15 mL Mouth Rinse BID  . metoprolol succinate  25 mg Oral Daily  . multivitamin with minerals  1 tablet Oral Daily  . nicotine  21 mg Transdermal Daily  . pantoprazole  40 mg Oral Daily  . thiamine  100 mg Oral Daily   Or  . thiamine  100 mg Intravenous Daily  . Vilazodone HCl  20 mg Oral Daily   Continuous Infusions:   LOS: 3 days      Christopher CantorSaima Allissa Albright, MD [facility] Pager: [email or web address] 09/15/2020, 2:44 PM

## 2020-09-15 NOTE — Progress Notes (Signed)
Nutrition Brief Note  Patient identified on the Malnutrition Screening Tool (MST) Report  Wt Readings from Last 15 Encounters:  09/14/20 76.6 kg  01/11/20 80.7 kg  11/16/17 73.5 kg  08/10/17 77.1 kg  01/25/17 77.1 kg  06/16/16 75.4 kg  05/30/12 78 kg   54yM with history of chronic pain and opioid dependency, alcohol use with chart history of withdrawal who is admitted with seizure and acute encephalopathy, suspected alcohol withdrawal  Pt admitted with alcohol withdrawal.   Spoke with pt at bedside, who was pleasant and in good spirits today. He reports feeling much better and is grateful to be able to keep foods and liquids down. He has consumed all of his breakfast and lunch today. He is looking forward to his significant other visiting later, as she has been bringing him outside food.   Pt shares that PTA, he was unable to keep foods and liquids down for 3 days PTA. Prior to acute illness, he reports good appetite- consuming 2 meals per day.   Per pt, he denies any weight loss. He reports UBW around 175#.   Nutrition-Focused physical exam completed. Findings are no fat depletion, no muscle depletion, and no edema.   Labs reviewed.   Current diet order is regular, patient is consuming approximately 100% of meals at this time. Labs and medications reviewed.   No nutrition interventions warranted at this time. If nutrition issues arise, please consult RD.   Levada Schilling, RD, LDN, CDCES Registered Dietitian II Certified Diabetes Care and Education Specialist Please refer to [facility] for RD and/or RD on-call/weekend/after hours pager

## 2020-09-15 NOTE — Consult Note (Addendum)
Neurology Consultation  CC: asked to see pt due to ? ETOH withdrawal seizure and to see if any further neuro workup is needed  History is obtained from: patient and chart  HPI: Christopher Underwood is a 54 y.o. male with a hx of chronic pain on opioids, ETOH abuse, OA, ADD, depression and HTN. Pt presented to the [facility] ED on 09/12/20 after having seizure like activity described as generalized and convulsive lasting about 2 mins. Seizure was witnessed by patient's wife. He was confused afterwards. He was admitted by PCCM and spent a couple of days in ICU on Precedex gtt on CIWA protocol. He was also noted to have electrolyte derangement and this was corrected. He was transferred to the floor today and hospitalist called for consult.  On my evaluation, patient initially reported that he has been in the hospital for about 2 weeks now.  When I brought him that he is only been here for 3 days, he reports that he was transferred here from another hospital where he was admitted for about 10 days.  I discussed that with his girlfriend [name] and she denied that he was admitted in any other hospital prior to coming here.  Patient gave me permission to speak to his girlfriend. On obtaining collateral history from his girlfriend, she reports the patient drinks pretty much every single day.  On days that he is driving, he would not drink.  She reports that on 09/11/2020, patient drove about 3 hours from [facility] to [place] and he was brought in after he had a seizure.  Patient's girlfriend reports that he would often not have a meal and would instead substitute that for drinking.  ROS: A complete ROS was performed and is negative except as noted in the HPI.   Past Medical History:  Diagnosis Date  . Arthritis   . Attention deficit disorder   . Depression   . Hypertension      Family History  Problem Relation Age of Onset  . Diabetes Other   . Hypertension Other   . Heart attack Other      Social  History:  reports that he quit smoking about 36 years ago. His smoking use included cigarettes. He has a 0.25 pack-year smoking history. He has never used smokeless tobacco. He reports current alcohol use. He reports that he does not use drugs.  Prior to Admission medications   Medication Sig Start Date End Date Taking? Authorizing Provider  ADDERALL XR 20 MG 24 hr capsule Take 20 mg by mouth every morning. 12/21/19  Yes [provider]  BELBUCA 450 MCG FILM Place 450 mcg under the tongue 2 (two) times daily.  12/25/19  Yes [provider]  hydrOXYzine (VISTARIL) 25 MG capsule Take 25 mg by mouth 2 (two) times daily as needed for anxiety.  11/18/19  Yes [provider]  lisinopril-hydrochlorothiazide (PRINZIDE,ZESTORETIC) 20-12.5 MG tablet Take 1 tablet by mouth daily.   Yes [provider]  meclizine (ANTIVERT) 25 MG tablet Take 1 tablet (25 mg total) by mouth 3 (three) times daily as needed for dizziness. 07/24/19  Yes Horton, Mayer Masker, MD  metoprolol succinate (TOPROL-XL) 25 MG 24 hr tablet Take 25 mg by mouth daily.   Yes [provider]  Multiple Vitamin (MULTIVITAMIN WITH MINERALS) TABS tablet Take 1 tablet by mouth daily.   Yes [provider]  omeprazole (PRILOSEC) 20 MG capsule Take 20 mg by mouth daily. 11/26/19  Yes [provider]  ondansetron (  ZOFRAN ODT) 4 MG disintegrating tablet Take 1 tablet (4 mg total) by mouth every 8 (eight) hours as needed for nausea or vomiting. 07/24/19  Yes Horton, Mayer Maskerourtney F, MD  oxyCODONE-acetaminophen (PERCOCET) 10-325 MG tablet Take 1 tablet by mouth every 6 (six) hours as needed for pain.  12/25/19  Yes [provider]  VIIBRYD 20 MG TABS Take 20 mg by mouth daily.  11/22/19  Yes [provider]  lisinopril (ZESTRIL) 10 MG tablet Take 10 mg by mouth daily. 06/03/20   [provider]  polyethylene glycol (MIRALAX / GLYCOLAX) 17 g packet Take 17 g by mouth daily. Patient not  taking: Reported on 09/12/2020 01/14/20   Narda BondsNettey, Ralph A, MD     Exam: Current vital signs: BP 134/82 (BP Location: Right Arm)   Pulse (!) 103   Temp 98.5 F (36.9 C)   Resp 16   Ht 5\' 10"  (1.778 m)   Wt 76.6 kg   SpO2 99%   BMI 24.23 kg/m    Physical Exam  Constitutional: Appears well-developed and well-nourished.  Psych: Affect appropriate to situation Eyes: No scleral injection Head: Normocephalic.  Respiratory: Effort normal, non-labored breathing Skin: WDI  Neuro: Mental Status: Patient is awake, alert, oriented to person, place, month, year, and situation. Patient is able to give a clear and coherent history. Cranial Nerves: II: Pupils are equal, round, and reactive to light.   V: Facial sensation is symmetric VII: Facial movement is symmetric.  VIII: hearing is intact to voice XI: Shoulder shrug is symmetric. XII: tongue is midline without atrophy or fasciculations.  Motor: Tone is normal. Bulk is normal. 5/5 strength in grips. Pt was standing and walking around the room. Cerebellar: Gait steady. He does have intention tremor. Reflexes: Symmetric, 2, throughout.   No results found for: CBC, CHOL  Results for orders placed or performed during the hospital encounter of 09/12/20 (from the past 48 hour(s))  Glucose, capillary     Status: None   Collection Time: 09/13/20  8:57 PM  Result Value Ref Range   Glucose-Capillary 76 70 - 99 mg/dL    Comment: Glucose reference range applies only to samples taken after fasting for at least 8 hours.  Glucose, capillary     Status: None   Collection Time: 09/13/20 11:49 PM  Result Value Ref Range   Glucose-Capillary 84 70 - 99 mg/dL    Comment: Glucose reference range applies only to samples taken after fasting for at least 8 hours.  CBC     Status: Abnormal   Collection Time: 09/14/20  1:16 AM  Result Value Ref Range   WBC 6.2 4.0 - 10.5 K/uL   RBC 2.82 (L) 4.22 - 5.81 MIL/uL   Hemoglobin 10.0 (L) 13.0 - 17.0  g/dL   HCT 16.130.4 (L) 39 - 52 %   MCV 107.8 (H) 80.0 - 100.0 fL   MCH 35.5 (H) 26.0 - 34.0 pg   MCHC 32.9 30.0 - 36.0 g/dL   RDW 09.611.5 04.511.5 - 40.915.5 %   Platelets 96 (L) 150 - 400 K/uL    Comment: REPEATED TO VERIFY Immature Platelet Fraction may be clinically indicated, consider ordering this additional test [id number]    nRBC 0.0 0.0 - 0.2 %    Comment: Performed at [facility], [address] [place], [place] [id number]  Basic metabolic panel     Status: Abnormal   Collection Time: 09/14/20  1:16 AM  Result Value Ref Range   Sodium  141 135 - 145 mmol/L   Potassium 4.4 3.5 - 5.1 mmol/L   Chloride 110 98 - 111 mmol/L   CO2 17 (L) 22 - 32 mmol/L   Glucose, Bld 86 70 - 99 mg/dL    Comment: Glucose reference range applies only to samples taken after fasting for at least 8 hours.   BUN 14 6 - 20 mg/dL   Creatinine, Ser 8.84 (H) 0.61 - 1.24 mg/dL   Calcium 7.9 (L) 8.9 - 10.3 mg/dL   GFR, Estimated >16 >60 mL/min    Comment: (NOTE) Calculated using the CKD-EPI Creatinine Equation (2021)    Anion gap 14 5 - 15    Comment: Performed at [facility], [address], [place], [place] [address]  Magnesium     Status: None   Collection Time: 09/14/20  1:16 AM  Result Value Ref Range   Magnesium 2.2 1.7 - 2.4 mg/dL    Comment: Performed at [facility], [address] [place], [place] [address]  Glucose, capillary     Status: None   Collection Time: 09/14/20  3:51 AM  Result Value Ref Range   Glucose-Capillary 86 70 - 99 mg/dL    Comment: Glucose reference range applies only to samples taken after fasting for at least 8 hours.  Glucose, capillary     Status: Abnormal   Collection Time: 09/14/20  8:31 AM  Result Value Ref Range   Glucose-Capillary 102 (H) 70 - 99 mg/dL    Comment: Glucose reference range applies only to samples taken after fasting for at least 8 hours.  Glucose, capillary     Status: Abnormal   Collection Time: 09/14/20 11:37 AM  Result Value Ref  Range   Glucose-Capillary 65 (L) 70 - 99 mg/dL    Comment: Glucose reference range applies only to samples taken after fasting for at least 8 hours.  Glucose, capillary     Status: None   Collection Time: 09/14/20  2:36 PM  Result Value Ref Range   Glucose-Capillary 95 70 - 99 mg/dL    Comment: Glucose reference range applies only to samples taken after fasting for at least 8 hours.  Glucose, capillary     Status: None   Collection Time: 09/14/20  5:17 PM  Result Value Ref Range   Glucose-Capillary 92 70 - 99 mg/dL    Comment: Glucose reference range applies only to samples taken after fasting for at least 8 hours.  CBC     Status: Abnormal   Collection Time: 09/15/20  3:10 AM  Result Value Ref Range   WBC 5.7 4.0 - 10.5 K/uL   RBC 2.65 (L) 4.22 - 5.81 MIL/uL   Hemoglobin 9.3 (L) 13.0 - 17.0 g/dL   HCT 23.5 (L) 39 - 52 %   MCV 104.9 (H) 80.0 - 100.0 fL   MCH 35.1 (H) 26.0 - 34.0 pg   MCHC 33.5 30.0 - 36.0 g/dL   RDW 57.3 22.0 - 25.4 %   Platelets 121 (L) 150 - 400 K/uL    Comment: REPEATED TO VERIFY Immature Platelet Fraction may be clinically indicated, consider ordering this additional test [id number]    nRBC 0.0 0.0 - 0.2 %    Comment: Performed at [facility], [address], [place], [place] [address]  Basic metabolic panel     Status: Abnormal   Collection Time: 09/15/20  3:10 AM  Result Value Ref Range   Sodium 137 135 - 145 mmol/L  Potassium 3.6 3.5 - 5.1 mmol/L   Chloride 104 98 - 111 mmol/L   CO2 22 22 - 32 mmol/L   Glucose, Bld 124 (H) 70 - 99 mg/dL    Comment: Glucose reference range applies only to samples taken after fasting for at least 8 hours.   BUN 9 6 - 20 mg/dL   Creatinine, Ser 7.82 0.61 - 1.24 mg/dL   Calcium 8.5 (L) 8.9 - 10.3 mg/dL   GFR, Estimated >42 >35 mL/min    Comment: (NOTE) Calculated using the CKD-EPI Creatinine Equation (2021)    Anion gap 11 5 - 15    Comment: Performed at [facility], [address] [place],  [place] [address]    No results found.   I have reviewed the images obtained:   CT Head without contrast: CTH was negative for a large hypodensity concerning for a large territory infarct or hyperdensity concerning for an ICH   EEG:  This study is within normal limits. The excessive beta activity seen in the background is most likely due to the effect of benzodiazepine and is a benign EEG pattern.  No seizures or epileptiform discharges were seen throughout the recording.    Primary Diagnosis:  ETOH withdrawal seizure as evidence by his ETOH level < 10 on admission. 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  Impression: 54 yo male with a hx of ETOH abuse who presented to the ED on 11/19 shortly after a generalized convulsive seizure. There was conflict as to whether this was an ETOH withdrawal seizure because his wife stated he drank everyday. He was admitted to ICU and transferred to the floor today. No further sz activity in the hospital. EEG neg for epileptic activity. He does seem to be providing conflicting history and different information to different providers which is concerning for confabulation. His girl friend provides colateral and does mention cognitive issues specially with memory and making things up. 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  Recommendations: - Seizure likely withdrawal seizure given undetectable EtOh levels at presentation. Discussed no driving for 6 months and he has to be seizure free for 6 months and has to be cleared by neurology before he can resume driving. His girlfriend was also notified of this in person. - Will hold off on starting any antiepileptics at this time. - Discussed with pt that he is at risk of further seizures if he abruptly stops drinking. If he is contemplating quitting Alcohol, he should be admitted in the hospital for monitoring for  seizures. - I ordered high dose thiamine, wernicke's protocol - I ordered MRI Brain without contrast.  Jimmye Norman, NP neuro/Case and plan discussed with Dr. Derry Lory.

## 2020-09-16 DIAGNOSIS — F10231 Alcohol dependence with withdrawal delirium: Secondary | ICD-10-CM | POA: Diagnosis not present

## 2020-09-16 DIAGNOSIS — R569 Unspecified convulsions: Secondary | ICD-10-CM | POA: Diagnosis not present

## 2020-09-16 DIAGNOSIS — I10 Essential (primary) hypertension: Secondary | ICD-10-CM | POA: Diagnosis not present

## 2020-09-16 LAB — CBC
HCT: 26.6 % — ABNORMAL LOW (ref 39.0–52.0)
Hemoglobin: 9 g/dL — ABNORMAL LOW (ref 13.0–17.0)
MCH: 35.2 pg — ABNORMAL HIGH (ref 26.0–34.0)
MCHC: 33.8 g/dL (ref 30.0–36.0)
MCV: 103.9 fL — ABNORMAL HIGH (ref 80.0–100.0)
Platelets: 129 10*3/uL — ABNORMAL LOW (ref 150–400)
RBC: 2.56 MIL/uL — ABNORMAL LOW (ref 4.22–5.81)
RDW: 11.8 % (ref 11.5–15.5)
WBC: 5.9 10*3/uL (ref 4.0–10.5)
nRBC: 0 % (ref 0.0–0.2)

## 2020-09-16 LAB — BASIC METABOLIC PANEL
Anion gap: 11 (ref 5–15)
BUN: 7 mg/dL (ref 6–20)
CO2: 21 mmol/L — ABNORMAL LOW (ref 22–32)
Calcium: 8.9 mg/dL (ref 8.9–10.3)
Chloride: 106 mmol/L (ref 98–111)
Creatinine, Ser: 0.82 mg/dL (ref 0.61–1.24)
GFR, Estimated: >60 mL/min (ref >60)
Glucose, Bld: 138 mg/dL — ABNORMAL HIGH (ref 70–99)
Potassium: 3.5 mmol/L (ref 3.5–5.1)
Sodium: 138 mmol/L (ref 135–145)

## 2020-09-16 MED ORDER — VITAMIN B-12 1000 MCG PO TABS
1000.0000 ug | ORAL_TABLET | Freq: Every day | ORAL | Status: DC
  Administered 2020-09-17: 1000 ug via ORAL
  Filled 2020-09-16 (×2): qty 1

## 2020-09-16 MED ORDER — VITAMIN B-12 1000 MCG PO TABS: 1000.0000 ug | ORAL_TABLET | Freq: Every day | ORAL | Status: DC

## 2020-09-16 MED ORDER — LORAZEPAM 2 MG/ML IJ SOLN
0.5000 mg | Freq: Three times a day (TID) | INTRAMUSCULAR | Status: DC
  Administered 2020-09-16 (×3): 0.5 mg via INTRAVENOUS
  Filled 2020-09-16 (×3): qty 1

## 2020-09-16 NOTE — Progress Notes (Signed)
NEUROLOGY CONSULTATION PROGRESS NOTE   Date of service: September 16, 2020 Patient Name: Christopher Underwood MRN:  [id number] DOB:  1966-02-08   Interval Hx   No acute events overnight. He has been enjoying hospital meals.  Vitals   Vitals:   09/15/20 1459 09/15/20 2059 09/16/20 0210 09/16/20 0537  BP: 134/82 133/86 134/80 115/79  Pulse: (!) 103 79 80 84  Resp: 16 20  20   Temp: 98.5 F (36.9 C) 98.1 F (36.7 C)  98.3 F (36.8 C)  TempSrc:  Oral  Oral  SpO2: 99% 99%  97%  Weight:      Height:         Body mass index is 24.23 kg/m.  Physical Exam   General: Laying comfortably in bed; in no acute distress. HENT: Normal oropharynx and mucosa. Normal external appearance of ears and nose. Neck: Supple, no pain or tenderness CV: No JVD. No peripheral edema. Pulmonary: Symmetric Chest rise. Normal respiratory effort. Abdomen: Soft to touch, non-tender. Ext: No cyanosis, edema, or deformity Skin: No rash. Normal palpation of skin.  Musculoskeletal: Normal digits and nails by inspection. No clubbing.  Neurologic Examination  Mental status/Cognition: Alert, oriented to self, place, month and year, good attention. Speech/language: Fluent, comprehension intact, object naming intact, repetition intact. Cranial nerves:   CN II Pupils equal and reactive to light, no VF deficits   CN III,IV,VI EOM intact, no gaze preference or deviation, no nystagmus   CN V normal sensation in V1, V2, and V3 segments bilaterally   CN VII no asymmetry, no nasolabial fold flattening   CN VIII normal hearing to speech   CN IX & X normal palatal elevation, no uvular deviation   CN XI 5/5 head turn and 5/5 shoulder shrug bilaterally   CN XII midline tongue protrusion    Motor:  Muscle bulk: normal, tone normal, pronator drift none tremor yes. He has intentional tremor. Mvmt Root Nerve  Muscle Right Left Comments  SA C5/6 Ax Deltoid 5 5   EF C5/6 Mc Biceps 5 5   EE C6/7/8 Rad Triceps 5 5   WF C6/7 Med FCR  5 5   WE C7/8 PIN ECU 5 5   F Ab C8/T1 U ADM/FDI 5 5   HF L1/2/3 Fem Illopsoas 5 5   KE L2/3/4 Fem Quad 5 5   DF L4/5 D Peron Tib Ant 5 5   PF S1/2 Tibial Grc/Sol 5 5    Reflexes:  Right Left Comments  Pectoralis      Biceps (C5/6) 1 1   Brachioradialis (C5/6) 1 1    Triceps (C6/7) 1 1    Patellar (L3/4) 1 1    Achilles (S1)      Hoffman      Plantar     Jaw jerk    Sensation:  Light touch Intact throughout   Pin prick    Temperature    Vibration Intact throughout  Proprioception    Coordination/Complex Motor:  - Finger to Nose with tremors and mild ataxia BL - Heel to shin normal - Gait: Stride length normal. Arm swing none. Base width short/  Labs   Basic Metabolic Panel:  Lab Results  Component Value Date   NA 138 09/16/2020   K 3.5 09/16/2020   CO2 21 (L) 09/16/2020   GLUCOSE 138 (H) 09/16/2020   BUN 7 09/16/2020   CREATININE 0.82 09/16/2020   CALCIUM 8.9 09/16/2020   GFRNONAA >60 09/16/2020   GFRAA >60 01/12/2020  HbA1c: No results found for: HGBA1C LDL: No results found for: [facility] Urine Drug Screen:     Component Value Date/Time   LABOPIA POSITIVE (A) 09/12/2020 1859   COCAINSCRNUR NONE DETECTED 09/12/2020 1859   LABBENZ NONE DETECTED 09/12/2020 1859   AMPHETMU NONE DETECTED 09/12/2020 1859   THCU NONE DETECTED 09/12/2020 1859   LABBARB NONE DETECTED 09/12/2020 1859    Alcohol Level     Component Value Date/Time   ETH <10 09/12/2020 1741   No results found for: PHENYTOIN, ZONISAMIDE, LAMOTRIGINE, LEVETIRACETA No results found for: PHENYTOIN, PHENOBARB, VALPROATE, CBMZ  Imaging and Diagnostic studies   MR BRAIN WO CONTRAST  Narrative CLINICAL DATA:  Substance abuse and seizure-like activity.  EXAM: MRI HEAD WITHOUT CONTRAST  TECHNIQUE: Multiplanar, multiecho pulse sequences of the brain and surrounding structures were obtained without intravenous contrast.  COMPARISON:  None.  FINDINGS: Brain: No acute infarct, acute  hemorrhage or extra-axial collection. Normal white matter signal. Normal volume of CSF spaces. No chronic microhemorrhage. Normal midline structures. The hippocampi are normal and symmetric in size and signal. The hypothalamus and mamillary bodies are normal. There is no cortical ectopia or dysplasia.  Vascular: Major flow voids are preserved.  Skull and upper cervical spine: Normal calvarium and skull base. Visualized upper cervical spine and soft tissues are normal.  Sinuses/Orbits:No paranasal sinus fluid levels or advanced mucosal thickening. No mastoid or middle ear effusion. Normal orbits.  IMPRESSION: Normal brain MRI.  Impression   Christopher Underwood is a 54 y.o. male with PMH significant for significant EtOH use presenting with seizure and here for inpatient withdrawal management. His neurologic examination is notable for confabulation, particularly providing different story to different provides, seems like this has been going on for a few months atleast per discussion with his girlfriend Christopher Underwood. There is also some concern for some behavioral issues. He does have pretty significant history of substituting meals with EtOH.  Workup with MRI Brain with no acute abnormality.  I spoke to him and discussed how EtOH is neurotoxic and him skipping meals with his drinking can cause a lot of problems for him including dementia, tremors, balance issues, neuropathy, lethargy and tiredness. He understands that drinking is bad for him and will try not to drink EtOH when he returns home.  Recommendations  - Continue high dose thiamine while he is inpatient. Can transition to Thiamine 100mg  PO at discharge. - I ordered Cyanocobalamin daily PO. - Would recommend starting him on multivitamin at discharge in addition to continuing Thiamine 100mg  PO daily and Cyanocobalamin daily PO. - He should follow up with Dementia clinic outpatient in 2 months for further evaluation for potential  Korsakoff's syndrome. - Suspect that the seizure was likely withdrawal seizure given undetectable EtOH levels in the ED. No driving for 6 months. Has to be seizure free before he can resume driving. - Discussed full seizure precautions with him and listed below. - Neurology inpatient team will signoff. Please feel free to contact with any questions  ______________________________________________________________________   Thank you for the opportunity to take part in the care of this patient. If you have any further questions, please contact the neurology consultation attending.  Signed,  [facility] Pager Number [phone]  Seizure precautions at discharge: Per [employer] [facility] [employer] statutes, patients with seizures are not allowed to drive until they have been seizure-free for six months and cleared by a physician    Use caution when using heavy equipment or power tools.  Avoid working on ladders or at heights. Take showers instead of baths. Ensure the water temperature is not too high on the home water heater. Do not go swimming alone. Do not lock yourself in a room alone (i.e. bathroom). When caring for infants or small children, sit down when holding, feeding, or changing them to minimize risk of injury to the child in the event you have a seizure. Maintain good sleep hygiene. Avoid alcohol.    If patient has another seizure, call 911 and bring them back to the ED if: A.  The seizure lasts longer than 5 minutes.      B.  The patient doesn't wake shortly after the seizure or has new problems such as difficulty seeing, speaking or moving following the seizure C.  The patient was injured during the seizure D.  The patient has a temperature over 102 F (39C) E.  The patient vomited during the seizure and now is having trouble breathing    During the Seizure   - First, ensure adequate ventilation and place patients on the floor on their left side  Loosen  clothing around the neck and ensure the airway is patent. If the patient is clenching the teeth, do not force the mouth open with any object as this can cause severe damage - Remove all items from the surrounding that can be hazardous. The patient may be oblivious to what's happening and may not even know what he or she is doing. If the patient is confused and wandering, either gently guide him/her away and block access to outside areas - Reassure the individual and be comforting - Call 911. In most cases, the seizure ends before EMS arrives. However, there are cases when seizures may last over 3 to 5 minutes. Or the individual may have developed breathing difficulties or severe injuries. If a pregnant patient or a person with diabetes develops a seizure, it is prudent to call an ambulance. - Finally, if the patient does not regain full consciousness, then call EMS. Most patients will remain confused for about 45 to 90 minutes after a seizure, so you must use judgment in calling for help. - Avoid restraints but make sure the patient is in a bed with padded side rails - Place the individual in a lateral position with the neck slightly flexed; this will help the saliva drain from the mouth and prevent the tongue from falling backward - Remove all nearby furniture and other hazards from the area - Provide verbal assurance as the individual is regaining consciousness - Provide the patient with privacy if possible - Call for help and start treatment as ordered by the caregiver    After the Seizure (Postictal Stage)   After a seizure, most patients experience confusion, fatigue, muscle pain and/or a headache. Thus, one should permit the individual to sleep. For the next few days, reassurance is essential. Being calm and helping reorient the person is also of importance.   Most seizures are painless and end spontaneously. Seizures are not harmful to others but can lead to complications such as stress on  the lungs, brain and the heart. Individuals with prior lung problems may develop labored breathing and respiratory distress.

## 2020-09-16 NOTE — Progress Notes (Addendum)
PROGRESS NOTE    MADDUX VANSCYOC   XBM:[id number]  DOB: [birth year]  DOA: 09/12/2020 PCP: Macy Mis, MD   Brief Narrative:  Christopher Underwood is a 54y/o with chronic pain, nicotine abuse (vapes), alcohol abuse, HTN, recent issues with vertigo who presented to the hospital with a seizure followed by confusion. According to noted in chart, the patient has not reduced his ETOH use or had and changes in medications other than the addition of Meclizine. The patient and the significant other both commented in the ED that he had not cut back on ETOH.   In the ED, he was noted to be tremulous and having visual hallucinations.  This was followed by agitation. After Ativan and Valium, he continued to have symptoms and then was started on Precedex. An EEG was ordered and it was suspected that he seizure was related to ETOH withdrawal.   CR 2.13, sodium 127, TSH 6.378, Opiate + on UDS, Hb 9.7, Plt 85  EEG 11/20> The excessive beta activity seen in the background is most likely due to the effect of benzodiazepine and is a benign EEG pattern. No seizures or epileptiform discharges were seen throughout the recording.  11/20> hepatic steatosis on abdominal ultrasound, unchanged mild gallbladder wall thickening likely related to hepatic dysfunction  11/21- alert- started on diet, titrated off of Precedex. Scheduled Ativan was continued but reduced in frequency.   Subjective: Asking to go home again today. No other complaints.     Assessment & Plan:   Principal Problem:   Seizure  - with post op confusion/ agitation - the patient and wife both insist that he had not cut back on the amount of alcohol he drinks - ? If seizure was secondary to ETOH abuse itself- he was not on any new medications prior to the seizure- although med rec states he takes Clonazepam, he and his sig other state he is no longer taking this -  Neuro feels this may have been an ETOH w/d seizure as he was told that the patient had  not drank any alcohol on the day he was admitted.  - MRI unrevealing -no need for AED at this time-  no driving for 6 months - dicussed plan with neuro yesterday and again today  Active Problems:   Alcohol abuse and withdrawal  Elevated LFTs with Hepatic steatosis  - He was started on Ativan routine by the ICU team- I have been tapering it- cont Ativan IV TID but reduce dose to 0.5 mg TID today  - he appears a little anxious about going home but no other signs of withdrawal - he states he does not need any help with quitting - cont Thiamine and Folate  Dementia vs Korsakoff's syndrome vs confusion related to ETOH withdrawal vs due to Ativan - Neuro has started Thiamine 500 mg IV (first dose given today) and recommends at least 3 days of it via IV - follow to see if symptoms improve  AKI - Cr 2.13 when admitted - improved to normal  Hyponatremia - on admission- has improved  Macrocytic anemia - likely partly from ETOH - B12 is low at 176- start replacing with s/c B12 in hospital x 2 days and then orally - will need script for oral B12 - normal folate levels  Thrombocytopenia - due to ETOH and possibly also due to B12 deficiency- improving    Hypertension - cont Lisinopril and Toprol    Chronic pain - goes to a pain clinic -  cont Oxycodone- (takes percocet 10/325 Q 6 PRN but this is not available in the hospital) -cont Buprenorphine  Depression anxiety - cont Vilazodone (does not take it regularly at home)  Nicotine abuse  - admits to vaping but not much - 7 mg Nicotine patch    Time spent in minutes: 45 DVT prophylaxis: heparin injection 5,000 Units Start: 09/12/20 2200  Code Status: Full code Family Communication: significant other- I spoke with her in person on 11/22 and have left a message on her cell phone for a call back today Disposition Plan:  Status is: Inpatient  Remains inpatient appropriate because:treating ETOH withdrawl   Dispo: The patient is  from: Home              Anticipated d/c is to: Home              Anticipated d/c date is: 2 days              Patient currently is not medically stable to d/c.   Consultants:   Admitted by PCCM Procedures:   EEG Antimicrobials:  Anti-infectives (From admission, onward)   Start     Dose/Rate Route Frequency Ordered Stop   09/12/20 2330  cefTRIAXone (ROCEPHIN) 2 g in sodium chloride 0.9 % 100 mL IVPB  Status:  Discontinued        2 g 200 mL/hr over 30 Minutes Intravenous Daily at bedtime 09/12/20 2319 09/13/20 0846       Objective: Vitals:   09/15/20 2059 09/16/20 0210 09/16/20 0537 09/16/20 1014  BP: 133/86 134/80 115/79 129/76  Pulse: 79 80 84 83  Resp: 20  20   Temp: 98.1 F (36.7 C)  98.3 F (36.8 C)   TempSrc: Oral  Oral   SpO2: 99%  97%   Weight:      Height:       No intake or output data in the 24 hours ending 09/16/20 1302 Filed Weights   09/12/20 2000 09/14/20 0500  Weight: 74.8 kg 76.6 kg    Examination: General exam: Appears comfortable  HEENT: PERRLA, oral mucosa moist, no sclera icterus or thrush Respiratory system: Clear to auscultation. Respiratory effort normal. Cardiovascular system: S1 & S2 heard,  No murmurs  Gastrointestinal system: Abdomen soft, non-tender, nondistended. Normal bowel sounds   Central nervous system: Alert and oriented. Appears more oriented today. No focal neurological deficits. Extremities: No cyanosis, clubbing or edema Skin: No rashes or ulcers Psychiatry:  Mood & affect appropriate.     Data Reviewed: I have personally reviewed following labs and imaging studies  CBC: Recent Labs  Lab 09/12/20 1740 09/12/20 1740 09/12/20 2248 09/13/20 0207 09/14/20 0116 09/15/20 0310 09/16/20 0130  WBC 7.5  --   --  5.6 6.2 5.7 5.9  NEUTROABS 4.2  --   --   --   --   --   --   HGB 11.1*   < > 8.2* 9.7* 10.0* 9.3* 9.0*  HCT 33.6*   < > 24.0* 28.7* 30.4* 27.8* 26.6*  MCV 106.3*  --   --  105.1* 107.8* 104.9* 103.9*  PLT  117*  --   --  85* 96* 121* 129*   < > = values in this interval not displayed.   Basic Metabolic Panel: Recent Labs  Lab 09/12/20 1740 09/12/20 1740 09/12/20 1856 09/12/20 2248 09/13/20 0207 09/14/20 0116 09/15/20 0310 09/16/20 0130  NA 127*   < >  --  134* 132* 141 137 138  K  3.9   < >  --  3.7 3.6 4.4 3.6 3.5  CL 92*  --   --   --  101 110 104 106  CO2 15*  --   --   --  20* 17* 22 21*  GLUCOSE 66*  --   --   --  78 86 124* 138*  BUN 19  --   --   --  18 14 9 7   CREATININE 2.13*  --   --   --  1.49* 1.30* 0.95 0.82  CALCIUM 7.9*  --   --   --  7.3* 7.9* 8.5* 8.9  MG  --   --  0.8*  --  1.5* 2.2  --   --   PHOS  --   --  4.3  --  4.5  --   --   --    < > = values in this interval not displayed.   GFR: Estimated Creatinine Clearance: 106.3 mL/min (by C-G formula based on SCr of 0.82 mg/dL). Liver Function Tests: Recent Labs  Lab 09/12/20 1740 09/13/20 0813  AST 79* 54*  ALT 46* 38  ALKPHOS 76 66  BILITOT 0.9 1.8*  PROT 7.2 5.4*  ALBUMIN 3.6 2.7*  2.8*   Recent Labs  Lab 09/12/20 2056  LIPASE 53*   Recent Labs  Lab 09/12/20 2102  AMMONIA 42*   Coagulation Profile: No results for input(s): INR, PROTIME in the last 168 hours. Cardiac Enzymes: Recent Labs  Lab 09/12/20 2056  CKTOTAL 406*   BNP (last 3 results) No results for input(s): PROBNP in the last 8760 hours. HbA1C: No results for input(s): HGBA1C in the last 72 hours. CBG: Recent Labs  Lab 09/14/20 0351 09/14/20 0831 09/14/20 1137 09/14/20 1436 09/14/20 1717  GLUCAP 86 102* 65* 95 92   Lipid Profile: No results for input(s): CHOL, HDL, LDLCALC, TRIG, CHOLHDL, LDLDIRECT in the last 72 hours. Thyroid Function Tests: No results for input(s): TSH, T4TOTAL, FREET4, T3FREE, THYROIDAB in the last 72 hours. Anemia Panel: Recent Labs    09/15/20 1535  FOLATE 29.7  RETICCTPCT 2.4   Urine analysis:    Component Value Date/Time   COLORURINE YELLOW 09/12/2020 1859   APPEARANCEUR CLEAR  09/12/2020 1859   LABSPEC 1.013 09/12/2020 1859   PHURINE 5.0 09/12/2020 1859   GLUCOSEU NEGATIVE 09/12/2020 1859   HGBUR MODERATE (A) 09/12/2020 1859   BILIRUBINUR NEGATIVE 09/12/2020 1859   KETONESUR NEGATIVE 09/12/2020 1859   PROTEINUR 100 (A) 09/12/2020 1859   UROBILINOGEN 0.2 05/30/2012 1350   NITRITE NEGATIVE 09/12/2020 1859   LEUKOCYTESUR NEGATIVE 09/12/2020 1859   Sepsis Labs: @LABRCNTIP (procalcitonin:4,lacticidven:4) ) Recent Results (from the past 240 hour(s))  Respiratory Panel by RT PCR (Flu A&B, Covid) - Nasopharyngeal Swab     Status: None   Collection Time: 09/12/20  8:28 PM   Specimen: Nasopharyngeal Swab; Nasopharyngeal(NP) swabs in vial transport medium  Result Value Ref Range Status   SARS Coronavirus 2 by RT PCR NEGATIVE NEGATIVE Final    Comment: (NOTE) SARS-CoV-2 target nucleic acids are NOT DETECTED.  The SARS-CoV-2 RNA is generally detectable in upper respiratoy specimens during the acute phase of infection. The lowest concentration of SARS-CoV-2 viral copies this assay can detect is 131 copies/mL. A negative result does not preclude SARS-Cov-2 infection and should not be used as the sole basis for treatment or other patient management decisions. A negative result may occur with  improper specimen collection/handling, submission of specimen other than nasopharyngeal swab,  presence of viral mutation(s) within the areas targeted by this assay, and inadequate number of viral copies (<131 copies/mL). A negative result must be combined with clinical observations, patient history, and epidemiological information. The expected result is Negative.  Fact Sheet for Patients:  https://www.moore.com/  Fact Sheet for Healthcare Providers:  https://www.young.biz/  This test is no t yet approved or cleared by the [place] FDA and  has been authorized for detection and/or diagnosis of SARS-CoV-2 by FDA under an Emergency  Use Authorization (EUA). This EUA will remain  in effect (meaning this test can be used) for the duration of the COVID-19 declaration under Section 564(b)(1) of the Act, 21 U.S.C. section 360bbb-3(b)(1), unless the authorization is terminated or revoked sooner.     Influenza A by PCR NEGATIVE NEGATIVE Final   Influenza B by PCR NEGATIVE NEGATIVE Final    Comment: (NOTE) The Xpert Xpress SARS-CoV-2/FLU/RSV assay is intended as an aid in  the diagnosis of influenza from Nasopharyngeal swab specimens and  should not be used as a sole basis for treatment. Nasal washings and  aspirates are unacceptable for Xpert Xpress SARS-CoV-2/FLU/RSV  testing.  Fact Sheet for Patients: https://www.moore.com/  Fact Sheet for Healthcare Providers: https://www.young.biz/  This test is not yet approved or cleared by the [place] FDA and  has been authorized for detection and/or diagnosis of SARS-CoV-2 by  FDA under an Emergency Use Authorization (EUA). This EUA will remain  in effect (meaning this test can be used) for the duration of the  Covid-19 declaration under Section 564(b)(1) of the Act, 21  U.S.C. section 360bbb-3(b)(1), unless the authorization is  terminated or revoked. Performed at [facility], [address], [place], [place] [address]   Culture, blood (routine x 2)     Status: None (Preliminary result)   Collection Time: 09/12/20 11:47 PM   Specimen: BLOOD  Result Value Ref Range Status   Specimen Description BLOOD SITE NOT SPECIFIED  Final   Special Requests   Final    BOTTLES DRAWN AEROBIC AND ANAEROBIC Blood Culture adequate volume   Culture   Final    NO GROWTH 3 DAYS Performed at [facility], [address] [place], [place] [address]    Report Status PENDING  Incomplete  Culture, blood (routine x 2)     Status: None (Preliminary result)   Collection Time: 09/13/20  2:17 AM   Specimen: BLOOD  Result Value Ref Range  Status   Specimen Description BLOOD LEFT HAND  Final   Special Requests   Final    BOTTLES DRAWN AEROBIC ONLY Blood Culture adequate volume   Culture   Final    NO GROWTH 3 DAYS Performed at [facility], [address] [place], [place] [address]    Report Status PENDING  Incomplete  MRSA PCR Screening     Status: None   Collection Time: 09/13/20  2:29 AM   Specimen: Nasal Mucosa; Nasopharyngeal  Result Value Ref Range Status   MRSA by PCR NEGATIVE NEGATIVE Final    Comment:        The GeneXpert MRSA Assay (FDA approved for NASAL specimens only), is one component of a comprehensive MRSA colonization surveillance program. It is not intended to diagnose MRSA infection nor to guide or monitor treatment for MRSA infections. Performed at [facility], [address], [place], [place] [address]          Radiology Studies: MR BRAIN WO CONTRAST  Result Date: 09/15/2020 CLINICAL DATA:  Substance abuse and seizure-like activity. EXAM: MRI HEAD WITHOUT CONTRAST TECHNIQUE: Multiplanar, multiecho pulse sequences of the brain and surrounding structures were obtained without intravenous contrast. COMPARISON:  None. FINDINGS: Brain: No acute infarct, acute hemorrhage or extra-axial collection. Normal white matter signal. Normal volume of CSF spaces. No chronic microhemorrhage. Normal midline structures. The hippocampi are normal and symmetric in size and signal. The hypothalamus and mamillary bodies are normal. There is no cortical ectopia or dysplasia. Vascular: Major flow voids are preserved. Skull and upper cervical spine: Normal calvarium and skull base. Visualized upper cervical spine and soft tissues are normal. Sinuses/Orbits:No paranasal sinus fluid levels or advanced mucosal thickening. No mastoid or middle ear effusion. Normal orbits. IMPRESSION: Normal brain MRI. Electronically Signed   By: Deatra Robinson M.D.   On: 09/15/2020 20:19      Scheduled Meds: .  Buprenorphine HCl  450 mcg Sublingual BID  . cyanocobalamin  1,000 mcg Subcutaneous Daily  . folic acid  1 mg Oral Daily  . heparin  5,000 Units Subcutaneous Q8H  . lisinopril  10 mg Oral Daily  . LORazepam  0.5 mg Intravenous Q8H  . mouth rinse  15 mL Mouth Rinse BID  . metoprolol succinate  25 mg Oral Daily  . multivitamin with minerals  1 tablet Oral Daily  . nicotine  7 mg Transdermal Daily  . pantoprazole  40 mg Oral Daily  . [START ON 09/24/2020] thiamine injection  100 mg Intravenous Daily  . Vilazodone HCl  20 mg Oral Daily  . [START ON 09/17/2020] vitamin B-12  1,000 mcg Oral Daily   Continuous Infusions: . thiamine injection 500 mg (09/16/20 0534)   Followed by  . [START ON 09/18/2020] thiamine injection       LOS: 4 days      Calvert Cantor, MD [facility] Pager: [email or web address] 09/16/2020, 1:02 PM

## 2020-09-16 NOTE — Progress Notes (Addendum)
CSW met with pt for substance use consult. Pt explains that he has an uncle in [place] and that he plans to follow up with his uncle to discuss his drinking. Pt is optimistic about support from his uncle. Pt is agreeable to receiving list of resources but does not have any further questions and plans to meet with his uncle prior to seeking other treatments. CSW explained he would update pt's wife that resources have been left in room and pt is agreeable.   CSW left message with pt's wife.   1623: Pt's wife called pt's wife. CSW discussed different tx options with wife and provided resources verbally over the phone.

## 2020-09-17 DIAGNOSIS — R569 Unspecified convulsions: Secondary | ICD-10-CM | POA: Diagnosis not present

## 2020-09-17 LAB — CBC
HCT: 27.5 % — ABNORMAL LOW (ref 39.0–52.0)
Hemoglobin: 9.2 g/dL — ABNORMAL LOW (ref 13.0–17.0)
MCH: 35.7 pg — ABNORMAL HIGH (ref 26.0–34.0)
MCHC: 33.5 g/dL (ref 30.0–36.0)
MCV: 106.6 fL — ABNORMAL HIGH (ref 80.0–100.0)
Platelets: 169 10*3/uL (ref 150–400)
RBC: 2.58 MIL/uL — ABNORMAL LOW (ref 4.22–5.81)
RDW: 12.1 % (ref 11.5–15.5)
WBC: 5.3 10*3/uL (ref 4.0–10.5)
nRBC: 0 % (ref 0.0–0.2)

## 2020-09-17 LAB — BASIC METABOLIC PANEL
Anion gap: 9 (ref 5–15)
BUN: 6 mg/dL (ref 6–20)
CO2: 23 mmol/L (ref 22–32)
Calcium: 8.9 mg/dL (ref 8.9–10.3)
Chloride: 108 mmol/L (ref 98–111)
Creatinine, Ser: 0.93 mg/dL (ref 0.61–1.24)
GFR, Estimated: >60 mL/min (ref >60)
Glucose, Bld: 133 mg/dL — ABNORMAL HIGH (ref 70–99)
Potassium: 3.8 mmol/L (ref 3.5–5.1)
Sodium: 140 mmol/L (ref 135–145)

## 2020-09-17 MED ORDER — THIAMINE HCL 100 MG PO TABS: 100.0000 mg | ORAL_TABLET | Freq: Every day | ORAL | 3 refills | Status: AC

## 2020-09-17 MED ORDER — PREDNISONE 20 MG PO TABS
40.0000 mg | ORAL_TABLET | Freq: Every day | ORAL | Status: DC
  Administered 2020-09-17: 40 mg via ORAL
  Filled 2020-09-17 (×2): qty 2

## 2020-09-17 MED ORDER — FOLIC ACID 1 MG PO TABS
1.0000 mg | ORAL_TABLET | Freq: Every day | ORAL | 2 refills | Status: DC
Start: 2020-09-18 — End: 2023-04-15

## 2020-09-17 MED ORDER — LORAZEPAM 0.5 MG PO TABS: ORAL_TABLET | ORAL | 0 refills | Status: DC

## 2020-09-17 MED ORDER — NICOTINE 7 MG/24HR TD PT24: 7.0000 mg | MEDICATED_PATCH | Freq: Every day | TRANSDERMAL | 0 refills | Status: DC

## 2020-09-17 MED ORDER — LORAZEPAM 2 MG/ML IJ SOLN
0.5000 mg | Freq: Two times a day (BID) | INTRAMUSCULAR | Status: DC
  Administered 2020-09-17: 0.5 mg via INTRAVENOUS
  Filled 2020-09-17: qty 1

## 2020-09-17 MED ORDER — CYANOCOBALAMIN 1000 MCG PO TABS: 1000.0000 ug | ORAL_TABLET | Freq: Every day | ORAL | 0 refills | Status: AC

## 2020-09-17 MED ORDER — PREDNISONE 20 MG PO TABS: 40.0000 mg | ORAL_TABLET | Freq: Every day | ORAL | 0 refills | Status: AC

## 2020-09-17 NOTE — Progress Notes (Signed)
Pt IV d/c. Reviewed discharge instructions with patient and significant other.  Patient and significant other completed verbal teachback of schedule for Ativan, thiamine, and prednisone.  Pt rash on face clearing with afternoon dose of prednisone here.  All questions answered. Pt given handout of discharge instructions and hard copies of prescriptions to take to pharmacy of choice.

## 2020-09-17 NOTE — Discharge Summary (Signed)
Physician Discharge Summary  TERIUS JACUINDE QQI:[id number] DOB: [birth year] DOA: 09/12/2020  PCP: Macy Mis, MD  Admit date: 09/12/2020 Discharge date: 09/17/2020  Admitted From:  Home  Disposition: Home   Recommendations for Outpatient Follow-up:  1. Follow up with PCP in 1-2 weeks 2. Please obtain BMP/CBC in one week 3. Needs to follow up with neurology for evaluation of Dementia.  4. Follow up   Home Health: none  Discharge Condition: Stable.  CODE STATUS: Full code Diet recommendation: Heart Healthy  Brief/Interim Summary: 54 year old with chronic pain, nicotine abuse vapes, alcohol abuse, hypertension recent issues with vertigo who presented to the hospital with seizure followed by confusion.  According to notes in chart the patient has not reduces EtOH use or  had a change in medication other than additional meclizine.  In the ED he was noted to be tremulous and having visual hallucination.  This was followed by agitation.  After Ativan and Valium he continued to have symptoms and then was a started on Precedex.  EEG was ordered and it was suspected that he had a seizure was related to EtOH withdrawal.  Creatinine on admission 2.3 sodium 127 opiate positive on UDS.  EEG 11/20: The excessive beta activity seen in the background is most likely due to the effect of benzodiazepine and is a benign EEG pattern.  No seizures or epileptiform discharges were seen throughout  the recording.   11/20--hepatic steatosis on abdominal ultrasound, unchanged mild gallbladder wall thickening likely related to hepatic dysfunction. 11/21 patient was alert and started on yet titrated off of Precedex.  A scheduled Ativan was continued will reduce frequency.  11/24; patient alert and oriented, case discussed with neurology okay to taper Ativan over several days, okay to discharge home.  1-Seizure Patient was evaluated by neurology, seizure was thought to be related to alcohol withdrawal  seizures. MRI unrevealing. No need for AEDs at this time, no driving for 6 months. Discussed with neurology, patient can be transitioned to oral Ativan 0.5 twice daily for 2 days then 0.5 daily for 2 days then stop. Advised patient not to drink alcohol.  Follow-up with neurology as an outpatient  2-alcohol abuse and withdrawal, elevated liver function tests with hepatic steatosis: Related with Precedex drip. Decision to Ativan.  Discussed with neurology reduce Ativan to BID , oral for 2 days then 0.5 mg daily for 2 days then stop. Continue with thiamine and folate at discharge. 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 Last dose of high-dose IV thiamine today. Plan to discharge home on oral thiamine, B12 supplements Needs  to follow-up with neurology for further evaluation as an outpatient   4-AKI: Presented with a creatinine of 2.1.  Improved to normal.  5-hyponatremia: Improved with fluids 6-macrocytic anemia: B12 low at 176.  Plan to discharge on oral B12 supplementation B12 supplementation will probably help with memory deficits  Thrombocytopenia due to EtOH and B12 deficiency improving.  Follow-up as an outpatient  Hypertension: Continue lisinopril and Toprol Chronic pain: Continue with Percocet and buprenorphine  Depression: Continue with Vilazodone.  Nicotine abuse: Received nicotine patch Rash in the face: Could be eczema .   Will provide oral prednisone for 2 days.     Discharge Diagnoses:  Principal Problem:   Seizure [facility]) Active Problems:   Alcohol withdrawal (HCC)   Hypertension   Chronic  pain   Peripheral vertigo   Acute encephalopathy    Discharge Instructions  Discharge Instructions    Diet - low sodium heart healthy   Complete by: As directed    Increase activity slowly   Complete by: As directed       Allergies as of 09/17/2020   No Known Allergies     Medication List    STOP taking these medications   hydrOXYzine 25 MG capsule Commonly known as: VISTARIL   lisinopril-hydrochlorothiazide 20-12.5 MG tablet Commonly known as: ZESTORETIC   meclizine 25 MG tablet Commonly known as: ANTIVERT     TAKE these medications   Adderall XR 20 MG 24 hr capsule Generic drug: amphetamine-dextroamphetamine Take 20 mg by mouth every morning.   Belbuca 450 MCG Film Generic drug: Buprenorphine HCl Place 450 mcg under the tongue 2 (two) times daily.   cyanocobalamin 1000 MCG tablet Take 1 tablet (1,000 mcg total) by mouth daily. Start taking on: September 18, 2020   folic acid 1 MG tablet Commonly known as: FOLVITE Take 1 tablet (1 mg total) by mouth daily. Start taking on: September 18, 2020   lisinopril 10 MG tablet Commonly known as: ZESTRIL Take 10 mg by mouth daily.   LORazepam 0.5 MG tablet Commonly known as: Ativan Take 1 tablet BID for 2 days then 1 tablet daily for 2 days then stop.   metoprolol succinate 25 MG 24 hr tablet Commonly known as: TOPROL-XL Take 25 mg by mouth daily.   multivitamin with minerals Tabs tablet Take 1 tablet by mouth daily.   nicotine 7 mg/24hr patch Commonly known as: NICODERM CQ - dosed in mg/24 hr Place 1 patch (7 mg total) onto the skin daily. Start taking on: September 18, 2020   omeprazole 20 MG capsule Commonly known as: PRILOSEC Take 20 mg by mouth daily.   ondansetron 4 MG disintegrating tablet Commonly known as: Zofran ODT Take 1 tablet (4 mg total) by mouth every 8 (eight) hours as needed for nausea or vomiting.   oxyCODONE-acetaminophen 10-325 MG tablet Commonly known as: PERCOCET Take 1 tablet by mouth every 6 (six) hours as needed for pain.   polyethylene glycol 17 g packet Commonly known as: MIRALAX / GLYCOLAX Take 17 g by mouth daily.   predniSONE 20 MG tablet Commonly known as: DELTASONE Take 2 tablets (40 mg  total) by mouth daily with breakfast for 2 days.   thiamine 100 MG tablet Take 1 tablet (100 mg total) by mouth daily.   Viibryd 20 MG Tabs Generic drug: Vilazodone HCl Take 20 mg by mouth daily.       Follow-up Information    Macy Mis, MD Follow up in 1 week(s).   Specialty: Family Medicine Contact information: [address] [place] [address] [phone]        [facility] Follow up in 2 week(s).   Why: call office to get appointment  Contact information: [address] [place], [address] [place] [address] [phone]             No Known Allergies  Consultations:  Neurology   Procedures/Studies: CT Head Wo Contrast  Result Date: 09/12/2020 CLINICAL DATA:  Seizure. EXAM: CT HEAD WITHOUT CONTRAST TECHNIQUE: Contiguous axial images were obtained from the base of the skull through the vertex without intravenous contrast. COMPARISON:  January 11, 2020 FINDINGS: Brain: No evidence of acute infarction, hemorrhage, hydrocephalus, extra-axial collection or mass lesion/mass effect. Vascular: No hyperdense vessel or  unexpected calcification. Skull: Normal. Negative for fracture or focal lesion. Sinuses/Orbits: No acute finding. Other: None. IMPRESSION: No acute intracranial abnormalities. No cause for seizure identified. Electronically Signed   By: Gerome Sam III M.D   On: 09/12/2020 19:51   MR BRAIN WO CONTRAST  Result Date: 09/15/2020 CLINICAL DATA:  Substance abuse and seizure-like activity. EXAM: MRI HEAD WITHOUT CONTRAST TECHNIQUE: Multiplanar, multiecho pulse sequences of the brain and surrounding structures were obtained without intravenous contrast. COMPARISON:  None. FINDINGS: Brain: No acute infarct, acute hemorrhage or extra-axial collection. Normal white matter signal. Normal volume of CSF spaces. No chronic microhemorrhage. Normal midline structures. The hippocampi are normal and symmetric in size and signal.  The hypothalamus and mamillary bodies are normal. There is no cortical ectopia or dysplasia. Vascular: Major flow voids are preserved. Skull and upper cervical spine: Normal calvarium and skull base. Visualized upper cervical spine and soft tissues are normal. Sinuses/Orbits:No paranasal sinus fluid levels or advanced mucosal thickening. No mastoid or middle ear effusion. Normal orbits. IMPRESSION: Normal brain MRI. Electronically Signed   By: Deatra Robinson M.D.   On: 09/15/2020 20:19   DG CHEST PORT 1 VIEW  Result Date: 09/12/2020 CLINICAL DATA:  Seizure. EXAM: PORTABLE CHEST 1 VIEW COMPARISON:  November 15, 2017 FINDINGS: The heart, hila, and mediastinum are normal. No pneumothorax. Mild bibasilar opacities. No other acute abnormalities. IMPRESSION: Mild bibasilar opacities may represent atelectasis, pneumonia, or aspiration. Recommend clinical correlation with short-term follow-up to ensure resolution. Electronically Signed   By: Gerome Sam III M.D   On: 09/12/2020 21:27   EEG adult  Result Date: 09/13/2020 Charlsie Quest, MD     09/13/2020 11:48 AM Patient Name: HARRINGTON JOBE MRN: [id number] Epilepsy Attending: Charlsie Quest Referring Physician/Provider: Dr Chaney Malling Date: 09/13/2020 Duration: 27.34 mins Patient history: 54yo M with h/o etoh and narcotic abuse presented with GTC seizure. EEG to evaluate for seizure. Level of alertness: Awake, asleep AEDs during EEG study: Ativan Technical aspects: This EEG study was done with scalp electrodes positioned according to the 10-20 International system of electrode placement. Electrical activity was acquired at a sampling rate of 500Hz  and reviewed with a high frequency filter of 70Hz  and a low frequency filter of 1Hz . EEG data were recorded continuously and digitally stored. Description: No posterior dominant rhythm was seen. Sleep was characterized by vertex waves, sleep spindles (12 to 14 Hz), maximal frontocentral region. There is an excessive  amount of 15 to 18 Hz beta activity distributed symmetrically and diffusely. Hyperventilation and photic stimulation were not performed.   ABNORMALITY -Excessive beta, generalized IMPRESSION: This study is within normal limits. The excessive beta activity seen in the background is most likely due to the effect of benzodiazepine and is a benign EEG pattern. No seizures or epileptiform discharges were seen throughout the recording. Priyanka   Abdomen Limited RUQ (LIVER/GB)  Result Date: 09/13/2020 CLINICAL DATA:  54 year old male with abdominal pain and alcohol withdrawal. EXAM: ULTRASOUND ABDOMEN LIMITED RIGHT UPPER QUADRANT COMPARISON:  01/25/2017 CT FINDINGS: Gallbladder: There is no evidence of cholelithiasis, pericholecystic fluid or sonographic Murphy sign. Mild gallbladder wall thickening of 3.7 mm appears unchanged from 01/25/2017 CT. Common bile duct: Diameter: 5 mm.  No intrahepatic or extrahepatic biliary dilatation. Liver: Diffuse increased echogenicity is compatible with hepatic steatosis. No focal hepatic abnormalities are present. No gross morphologic changes of cirrhosis are noted on this exam. Portal vein is patent on color Doppler imaging with normal direction of blood flow  towards the liver. Other: None. IMPRESSION: 1. Mild gallbladder wall thickening, unchanged from 01/25/2017 CT, likely related to hepatic dysfunction. No evidence of cholelithiasis, pericholecystic fluid or sonographic Murphy sign. If there is strong clinical suspicion for acute cholecystitis, consider nuclear medicine study. 2. Hepatic steatosis. Electronically Signed   By: Harmon Pier M.D.   On: 09/13/2020 11:50     Subjective: He is alert and oriented to person and place he was able to tell me in the hospital was not able to tell me name of the hospital Developed a rash in the face.  No rash in any other place Discharge Exam: Vitals:   09/17/20 1200 09/17/20 1344  BP: 135/83 130/86  Pulse: 64 69   Resp:    Temp:  97.8 F (36.6 C)  SpO2:  98%     General: Pt is alert, awake, not in acute distress Cardiovascular: RRR, S1/S2 +, no rubs, no gallops Respiratory: CTA bilaterally, no wheezing, no rhonchi Abdominal: Soft, NT, ND, bowel sounds + Extremities: no edema, no cyanosis    The results of significant diagnostics from this hospitalization (including imaging, microbiology, ancillary and laboratory) are listed below for reference.     Microbiology: Recent Results (from the past 240 hour(s))  Respiratory Panel by RT PCR (Flu A&B, Covid) - Nasopharyngeal Swab     Status: None   Collection Time: 09/12/20  8:28 PM   Specimen: Nasopharyngeal Swab; Nasopharyngeal(NP) swabs in vial transport medium  Result Value Ref Range Status   SARS Coronavirus 2 by RT PCR NEGATIVE NEGATIVE Final    Comment: (NOTE) SARS-CoV-2 target nucleic acids are NOT DETECTED.  The SARS-CoV-2 RNA is generally detectable in upper respiratoy specimens during the acute phase of infection. The lowest concentration of SARS-CoV-2 viral copies this assay can detect is 131 copies/mL. A negative result does not preclude SARS-Cov-2 infection and should not be used as the sole basis for treatment or other patient management decisions. A negative result may occur with  improper specimen collection/handling, submission of specimen other than nasopharyngeal swab, presence of viral mutation(s) within the areas targeted by this assay, and inadequate number of viral copies (<131 copies/mL). A negative result must be combined with clinical observations, patient history, and epidemiological information. The expected result is Negative.  Fact Sheet for Patients:  https://www.moore.com/  Fact Sheet for Healthcare Providers:  https://www.young.biz/  This test is no t yet approved or cleared by the [place] FDA and  has been authorized for detection and/or diagnosis of  SARS-CoV-2 by FDA under an Emergency Use Authorization (EUA). This EUA will remain  in effect (meaning this test can be used) for the duration of the COVID-19 declaration under Section 564(b)(1) of the Act, 21 U.S.C. section 360bbb-3(b)(1), unless the authorization is terminated or revoked sooner.     Influenza A by PCR NEGATIVE NEGATIVE Final   Influenza B by PCR NEGATIVE NEGATIVE Final    Comment: (NOTE) The Xpert Xpress SARS-CoV-2/FLU/RSV assay is intended as an aid in  the diagnosis of influenza from Nasopharyngeal swab specimens and  should not be used as a sole basis for treatment. Nasal washings and  aspirates are unacceptable for Xpert Xpress SARS-CoV-2/FLU/RSV  testing.  Fact Sheet for Patients: https://www.moore.com/  Fact Sheet for Healthcare Providers: https://www.young.biz/  This test is not yet approved or cleared by the [place] FDA and  has been authorized for detection and/or diagnosis of SARS-CoV-2 by  FDA under an Emergency Use Authorization (EUA). This EUA will remain  in  effect (meaning this test can be used) for the duration of the  Covid-19 declaration under Section 564(b)(1) of the Act, 21  U.S.C. section 360bbb-3(b)(1), unless the authorization is  terminated or revoked. Performed at [facility], [address] [place], [place] [address]   Culture, blood (routine x 2)     Status: None (Preliminary result)   Collection Time: 09/12/20 11:47 PM   Specimen: BLOOD  Result Value Ref Range Status   Specimen Description BLOOD SITE NOT SPECIFIED  Final   Special Requests   Final    BOTTLES DRAWN AEROBIC AND ANAEROBIC Blood Culture adequate volume   Culture   Final    NO GROWTH 4 DAYS Performed at [facility], [address] [place], [place] [address]    Report Status PENDING  Incomplete  Culture, blood (routine x 2)     Status: None (Preliminary result)   Collection Time: 09/13/20  2:17 AM    Specimen: BLOOD  Result Value Ref Range Status   Specimen Description BLOOD LEFT HAND  Final   Special Requests   Final    BOTTLES DRAWN AEROBIC ONLY Blood Culture adequate volume   Culture   Final    NO GROWTH 4 DAYS Performed at [facility], [address] [place], [place] [address]    Report Status PENDING  Incomplete  MRSA PCR Screening     Status: None   Collection Time: 09/13/20  2:29 AM   Specimen: Nasal Mucosa; Nasopharyngeal  Result Value Ref Range Status   MRSA by PCR NEGATIVE NEGATIVE Final    Comment:        The GeneXpert MRSA Assay (FDA approved for NASAL specimens only), is one component of a comprehensive MRSA colonization surveillance program. It is not intended to diagnose MRSA infection nor to guide or monitor treatment for MRSA infections. Performed at [facility], [address] [place], [place] [address]      Labs: BNP (last 3 results) No results for input(s): BNP in the last 8760 hours. Basic Metabolic Panel: Recent Labs  Lab 09/12/20 1856 09/12/20 2248 09/13/20 0207 09/14/20 0116 09/15/20 0310 09/16/20 0130 09/17/20 0049  NA  --    < > 132* 141 137 138 140  K  --    < > 3.6 4.4 3.6 3.5 3.8  CL  --   --  101 110 104 106 108  CO2  --   --  20* 17* 22 21* 23  GLUCOSE  --   --  78 86 124* 138* 133*  BUN  --   --  18 14 9 7 6   CREATININE  --   --  1.49* 1.30* 0.95 0.82 0.93  CALCIUM  --   --  7.3* 7.9* 8.5* 8.9 8.9  MG 0.8*  --  1.5* 2.2  --   --   --   PHOS 4.3  --  4.5  --   --   --   --    < > = values in this interval not displayed.   Liver Function Tests: Recent Labs  Lab 09/12/20 1740 09/13/20 0813  AST 79* 54*  ALT 46* 38  ALKPHOS 76 66  BILITOT 0.9 1.8*  PROT 7.2 5.4*  ALBUMIN 3.6 2.7*  2.8*   Recent Labs  Lab 09/12/20 2056  LIPASE 53*   Recent Labs  Lab 09/12/20 2102  AMMONIA 42*   CBC: Recent Labs  Lab 09/12/20 1740 09/12/20  2248 09/13/20 0207 09/14/20 0116 09/15/20 0310 09/16/20 0130  09/17/20 0049  WBC 7.5  --  5.6 6.2 5.7 5.9 5.3  NEUTROABS 4.2  --   --   --   --   --   --   HGB 11.1*   < > 9.7* 10.0* 9.3* 9.0* 9.2*  HCT 33.6*   < > 28.7* 30.4* 27.8* 26.6* 27.5*  MCV 106.3*  --  105.1* 107.8* 104.9* 103.9* 106.6*  PLT 117*  --  85* 96* 121* 129* 169   < > = values in this interval not displayed.   Cardiac Enzymes: Recent Labs  Lab 09/12/20 2056  CKTOTAL 406*   BNP: Invalid input(s): POCBNP CBG: Recent Labs  Lab 09/14/20 0351 09/14/20 0831 09/14/20 1137 09/14/20 1436 09/14/20 1717  GLUCAP 86 102* 65* 95 92   D-Dimer No results for input(s): DDIMER in the last 72 hours. Hgb A1c No results for input(s): HGBA1C in the last 72 hours. Lipid Profile No results for input(s): CHOL, HDL, LDLCALC, TRIG, CHOLHDL, LDLDIRECT in the last 72 hours. Thyroid function studies No results for input(s): TSH, T4TOTAL, T3FREE, THYROIDAB in the last 72 hours.  Invalid input(s): FREET3 Anemia work up Recent Labs    09/15/20 1535  FOLATE 29.7  RETICCTPCT 2.4   Urinalysis    Component Value Date/Time   COLORURINE YELLOW 09/12/2020 1859   APPEARANCEUR CLEAR 09/12/2020 1859   LABSPEC 1.013 09/12/2020 1859   PHURINE 5.0 09/12/2020 1859   GLUCOSEU NEGATIVE 09/12/2020 1859   HGBUR MODERATE (A) 09/12/2020 1859   BILIRUBINUR NEGATIVE 09/12/2020 1859   KETONESUR NEGATIVE 09/12/2020 1859   PROTEINUR 100 (A) 09/12/2020 1859   UROBILINOGEN 0.2 05/30/2012 1350   NITRITE NEGATIVE 09/12/2020 1859   LEUKOCYTESUR NEGATIVE 09/12/2020 1859   Sepsis Labs Invalid input(s): PROCALCITONIN,  WBC,  LACTICIDVEN Microbiology Recent Results (from the past 240 hour(s))  Respiratory Panel by RT PCR (Flu A&B, Covid) - Nasopharyngeal Swab     Status: None   Collection Time: 09/12/20  8:28 PM   Specimen: Nasopharyngeal Swab; Nasopharyngeal(NP) swabs in vial transport medium  Result Value Ref Range Status   SARS Coronavirus 2 by RT PCR NEGATIVE NEGATIVE Final    Comment:  (NOTE) SARS-CoV-2 target nucleic acids are NOT DETECTED.  The SARS-CoV-2 RNA is generally detectable in upper respiratoy specimens during the acute phase of infection. The lowest concentration of SARS-CoV-2 viral copies this assay can detect is 131 copies/mL. A negative result does not preclude SARS-Cov-2 infection and should not be used as the sole basis for treatment or other patient management decisions. A negative result may occur with  improper specimen collection/handling, submission of specimen other than nasopharyngeal swab, presence of viral mutation(s) within the areas targeted by this assay, and inadequate number of viral copies (<131 copies/mL). A negative result must be combined with clinical observations, patient history, and epidemiological information. The expected result is Negative.  Fact Sheet for Patients:  https://www.moore.com/  Fact Sheet for Healthcare Providers:  https://www.young.biz/  This test is no t yet approved or cleared by the [place] FDA and  has been authorized for detection and/or diagnosis of SARS-CoV-2 by FDA under an Emergency Use Authorization (EUA). This EUA will remain  in effect (meaning this test can be used) for the duration of the COVID-19 declaration under Section 564(b)(1) of the Act, 21 U.S.C. section 360bbb-3(b)(1), unless the authorization is terminated or revoked sooner.     Influenza A by PCR NEGATIVE NEGATIVE Final   Influenza  B by PCR NEGATIVE NEGATIVE Final    Comment: (NOTE) The Xpert Xpress SARS-CoV-2/FLU/RSV assay is intended as an aid in  the diagnosis of influenza from Nasopharyngeal swab specimens and  should not be used as a sole basis for treatment. Nasal washings and  aspirates are unacceptable for Xpert Xpress SARS-CoV-2/FLU/RSV  testing.  Fact Sheet for Patients: https://www.moore.com/  Fact Sheet for Healthcare  Providers: https://www.young.biz/  This test is not yet approved or cleared by the [place] FDA and  has been authorized for detection and/or diagnosis of SARS-CoV-2 by  FDA under an Emergency Use Authorization (EUA). This EUA will remain  in effect (meaning this test can be used) for the duration of the  Covid-19 declaration under Section 564(b)(1) of the Act, 21  U.S.C. section 360bbb-3(b)(1), unless the authorization is  terminated or revoked. Performed at [facility], [address], [place], [place] [address]   Culture, blood (routine x 2)     Status: None (Preliminary result)   Collection Time: 09/12/20 11:47 PM   Specimen: BLOOD  Result Value Ref Range Status   Specimen Description BLOOD SITE NOT SPECIFIED  Final   Special Requests   Final    BOTTLES DRAWN AEROBIC AND ANAEROBIC Blood Culture adequate volume   Culture   Final    NO GROWTH 4 DAYS Performed at [facility], [address], [place], [place] [address]    Report Status PENDING  Incomplete  Culture, blood (routine x 2)     Status: None (Preliminary result)   Collection Time: 09/13/20  2:17 AM   Specimen: BLOOD  Result Value Ref Range Status   Specimen Description BLOOD LEFT HAND  Final   Special Requests   Final    BOTTLES DRAWN AEROBIC ONLY Blood Culture adequate volume   Culture   Final    NO GROWTH 4 DAYS Performed at [facility], [address] [place], [place] [address]    Report Status PENDING  Incomplete  MRSA PCR Screening     Status: None   Collection Time: 09/13/20  2:29 AM   Specimen: Nasal Mucosa; Nasopharyngeal  Result Value Ref Range Status   MRSA by PCR NEGATIVE NEGATIVE Final    Comment:        The GeneXpert MRSA Assay (FDA approved for NASAL specimens only), is one component of a comprehensive MRSA colonization surveillance program. It is not intended to diagnose MRSA infection nor to guide or monitor treatment for MRSA  infections. Performed at [facility], [address] [place], [place] [address]      Time coordinating discharge: 40 minutes  SIGNED:   Alba Cory, MD  [facility]

## 2020-09-18 LAB — CULTURE, BLOOD (ROUTINE X 2)
Culture: NO GROWTH
Culture: NO GROWTH
Special Requests: ADEQUATE
Special Requests: ADEQUATE

## 2020-09-20 LAB — METHYLMALONIC ACID, SERUM: Methylmalonic Acid, Quantitative: 166 nmol/L (ref 0–378)

## 2022-04-14 ENCOUNTER — Emergency Department (HOSPITAL_COMMUNITY)
Admission: EM | Admit: 2022-04-14 | Discharge: 2022-04-14 | Disposition: A | Discharge status: Home / Self Care | Payer: Medicaid Other | Attending: Emergency Medicine | Admitting: Emergency Medicine

## 2022-04-14 ENCOUNTER — Encounter (HOSPITAL_COMMUNITY): Payer: Self-pay

## 2022-04-14 DIAGNOSIS — R000 Tachycardia, unspecified: Secondary | ICD-10-CM | POA: Insufficient documentation

## 2022-04-14 DIAGNOSIS — F5104 Psychophysiologic insomnia: Secondary | ICD-10-CM | POA: Insufficient documentation

## 2022-04-14 DIAGNOSIS — F419 Anxiety disorder, unspecified: Secondary | ICD-10-CM | POA: Diagnosis present

## 2022-04-14 LAB — CBG MONITORING, ED: Glucose-Capillary: 84 mg/dL (ref 70–99)

## 2022-04-14 MED ORDER — HYDROXYZINE HCL 25 MG PO TABS: 25.0000 mg | ORAL_TABLET | Freq: Every evening | ORAL | 0 refills | Status: AC | PRN

## 2022-04-14 NOTE — ED Triage Notes (Signed)
Pt reports very high stress levels at home leading him to have no sleep over the past four nights. Pt states that his wife is out of town and he's responsible for care taking of his step son with mental health issues. Pt reports that earlier today he began having hallucinations he attributes to sleep deprivation. No somatic complaints at this time.

## 2022-04-14 NOTE — Discharge Instructions (Addendum)
We are giving you some medicine that should help you with sleep.  It is unclear if you are having a panic attack or anxiety attack.  It is unclear if you are having sleep issues or even a new psychiatry condition.  Provided is a phone number for behavioral health urgent care.  If your symptoms are not getting better, you still feel uneasy, unsafe, please go to the behavioral urgent care for further evaluation.

## 2022-04-14 NOTE — ED Provider Notes (Signed)
[facility] Note   CSN: [id number] Arrival date & time: 04/14/22  1239     History  Chief Complaint  Patient presents with   Insomnia    Christopher Underwood is a 56 y.o. male.  HPI     56 year old male comes in with PD with chief complaint of anxiety and difficulty in sleeping.  Patient states that he is living at home, taking care of his son-in-law who has schizophrenia.  Patient's wife has gone to their beach home for some remodeling for the last few weeks.  Over the last 3 days, patient has not been able to sleep well.  He states that his son has schizophrenia and will often scream.  He thinks that the son's behavior is affecting him.  He thinks that having no help to take care of him over the last several days is affecting him.  He started feeling little scared and locked his room.  He still could not sleep.  All of a sudden, he started feeling like he was getting attacked, and escaped his home and asked the neighbors to call police.  Currently, patient does not feel unsafe or threatened.  He actually wants to go home, has a son is alone and he does not want his son to be by himself.  Patient states that he used to have alcoholism.  Now he is on Belbuca and oxycodone for pain control.  He has not taken oxycodone for over 2 weeks as the pharmacy is out of it, but he does not think he is having any withdrawals from the medicine.  He has not been drinking the last few days.  He denies any substance use disorder.  There is no history of similar symptoms in the past.  Patient denies any hallucinations.   Home Medications Prior to Admission medications   Medication Sig Start Date End Date Taking? Authorizing Provider  ADDERALL XR 20 MG 24 hr capsule Take 20 mg by mouth every morning. 12/21/19   [provider]  BELBUCA 450 MCG FILM Place 450 mcg under the tongue 2 (two) times daily.  12/25/19   [provider]  folic acid  (FOLVITE) 1 MG tablet Take 1 tablet (1 mg total) by mouth daily. 09/18/20   Regalado, Belkys A, MD  lisinopril (ZESTRIL) 10 MG tablet Take 10 mg by mouth daily. 06/03/20   [provider]  LORazepam (ATIVAN) 0.5 MG tablet Take 1 tablet BID for 2 days then 1 tablet daily for 2 days then stop. 09/17/20   Regalado, Belkys A, MD  metoprolol succinate (TOPROL-XL) 25 MG 24 hr tablet Take 25 mg by mouth daily.    [provider]  Multiple Vitamin (MULTIVITAMIN WITH MINERALS) TABS tablet Take 1 tablet by mouth daily.    [provider]  nicotine (NICODERM CQ - DOSED IN MG/24 HR) 7 mg/24hr patch Place 1 patch (7 mg total) onto the skin daily. 09/18/20   Regalado, Belkys A, MD  omeprazole (PRILOSEC) 20 MG capsule Take 20 mg by mouth daily. 11/26/19   [provider]  ondansetron (ZOFRAN ODT) 4 MG disintegrating tablet Take 1 tablet (4 mg total) by mouth every 8 (eight) hours as needed for nausea or vomiting. 07/24/19   Horton, Mayer Masker, MD  oxyCODONE-acetaminophen (PERCOCET) 10-325 MG tablet Take 1 tablet by mouth every 6 (six) hours as needed for pain.  12/25/19   [provider]  polyethylene glycol (MIRALAX / GLYCOLAX) 17 g packet Take  17 g by mouth daily. Patient not taking: Reported on 09/12/2020 01/14/20   Narda Bonds, MD  thiamine 100 MG tablet Take 1 tablet (100 mg total) by mouth daily. 09/17/20   Regalado, Belkys A, MD  VIIBRYD 20 MG TABS Take 20 mg by mouth daily.  11/22/19   [provider]  vitamin B-12 1000 MCG tablet Take 1 tablet (1,000 mcg total) by mouth daily. 09/18/20   Regalado, Prentiss Bells, MD      Allergies    Patient has no known allergies.    Review of Systems   Review of Systems  All other systems reviewed and are negative.   Physical Exam Updated Vital Signs BP (!) 134/98   Pulse (!) 125   Temp 98.1 F (36.7 C) (Oral)   Resp 19   Ht 5' 10.5" (1.791 m)   Wt 77.1 kg   SpO2 94%   BMI 24.05 kg/m  Physical Exam Vitals  and nursing note reviewed.  Constitutional:      Appearance: He is well-developed.  HENT:     Head: Atraumatic.  Eyes:     Extraocular Movements: Extraocular movements intact.     Pupils: Pupils are equal, round, and reactive to light.  Cardiovascular:     Rate and Rhythm: Tachycardia present.  Pulmonary:     Effort: Pulmonary effort is normal.  Musculoskeletal:     Cervical back: Neck supple.  Skin:    General: Skin is warm.  Neurological:     Mental Status: He is alert and oriented to person, place, and time.     ED Results / Procedures / Treatments   Labs (all labs ordered are listed, but only abnormal results are displayed) Labs Reviewed - No data to display  EKG None  Radiology No results found.  Procedures Procedures    Medications Ordered in ED Medications - No data to display  ED Course/ Medical Decision Making/ A&P                           Medical Decision Making  56 year old male comes in with chief complaint of difficulty in sleeping. He called his neighbors to call police, because he felt unsafe.  Patient states that he has not slept in the last 3 days.  He is noted to be slightly tachycardic.  He had a delusional thought that he was being attacked.  Patient does not carry any psychiatry disease.  He is not on any psychiatric medication.  He has chronic pain syndrome for which she is on Belbuca, he has been off of oxycodone for at least 2 weeks now.  He has history of alcohol use disorder, but denies any alcohol use at this time.  Differential diagnosis includes psychiatric breakdown because of stress, organic insomnia, new onset of schizoaffective disorder or bipolar disorder, withdrawals from opiates.  Patient denies any hallucinations.  He has no SI, HI.  He is congruent and consistent with his history.  He wants to go home, as he does not think it is safe for his stepson to be at his home by himself.  I called patient's wife Christopher Underwood.  We went  over patient's history with her.  The supporting things that the patient mentioned such as additional help from the mother-in-law, patient's wife being at the beach for the last few weeks, chronic pain issues etc. are all consistent when we asked the same questions to his wife.  I feel  comfortable sending patient home.  We discussed behavioral health urgent care as a resource if patient is not feeling better and getting worse.  We will also give him hydroxyzine to see if it helps him fall asleep.   Final Clinical Impression(s) / ED Diagnoses Final diagnoses:  Anxiety  Psychophysiological insomnia    Rx / DC Orders ED Discharge Orders     None         Derwood Kaplan, MD 04/14/22 1409

## 2023-04-15 ENCOUNTER — Encounter (HOSPITAL_COMMUNITY)
Admission: EM | Disposition: A | Discharge status: Home / Self Care | Payer: Self-pay | Source: Home / Self Care | Attending: Emergency Medicine

## 2023-04-15 ENCOUNTER — Emergency Department (HOSPITAL_COMMUNITY): Payer: 59

## 2023-04-15 ENCOUNTER — Observation Stay (HOSPITAL_COMMUNITY): Payer: 59 | Admitting: Anesthesiology

## 2023-04-15 ENCOUNTER — Other Ambulatory Visit: Payer: Self-pay

## 2023-04-15 ENCOUNTER — Observation Stay (HOSPITAL_COMMUNITY): Payer: 59

## 2023-04-15 ENCOUNTER — Encounter (HOSPITAL_COMMUNITY): Payer: Self-pay

## 2023-04-15 ENCOUNTER — Observation Stay (HOSPITAL_COMMUNITY)
Admission: EM | Admit: 2023-04-15 | Discharge: 2023-04-17 | Disposition: A | Discharge status: Home / Self Care | Payer: 59 | Attending: Emergency Medicine | Admitting: Emergency Medicine

## 2023-04-15 ENCOUNTER — Observation Stay (HOSPITAL_BASED_OUTPATIENT_CLINIC_OR_DEPARTMENT_OTHER): Payer: 59 | Admitting: Anesthesiology

## 2023-04-15 DIAGNOSIS — Z86718 Personal history of other venous thrombosis and embolism: Secondary | ICD-10-CM | POA: Diagnosis not present

## 2023-04-15 DIAGNOSIS — R1084 Generalized abdominal pain: Secondary | ICD-10-CM

## 2023-04-15 DIAGNOSIS — I10 Essential (primary) hypertension: Secondary | ICD-10-CM | POA: Diagnosis not present

## 2023-04-15 DIAGNOSIS — R7401 Elevation of levels of liver transaminase levels: Secondary | ICD-10-CM | POA: Diagnosis not present

## 2023-04-15 DIAGNOSIS — Z87891 Personal history of nicotine dependence: Secondary | ICD-10-CM | POA: Diagnosis not present

## 2023-04-15 DIAGNOSIS — D696 Thrombocytopenia, unspecified: Secondary | ICD-10-CM | POA: Insufficient documentation

## 2023-04-15 DIAGNOSIS — R109 Unspecified abdominal pain: Secondary | ICD-10-CM | POA: Diagnosis present

## 2023-04-15 DIAGNOSIS — K8000 Calculus of gallbladder with acute cholecystitis without obstruction: Secondary | ICD-10-CM | POA: Diagnosis not present

## 2023-04-15 DIAGNOSIS — N179 Acute kidney failure, unspecified: Secondary | ICD-10-CM | POA: Insufficient documentation

## 2023-04-15 DIAGNOSIS — E871 Hypo-osmolality and hyponatremia: Secondary | ICD-10-CM | POA: Insufficient documentation

## 2023-04-15 DIAGNOSIS — D649 Anemia, unspecified: Secondary | ICD-10-CM | POA: Insufficient documentation

## 2023-04-15 DIAGNOSIS — Z79899 Other long term (current) drug therapy: Secondary | ICD-10-CM | POA: Insufficient documentation

## 2023-04-15 DIAGNOSIS — K8012 Calculus of gallbladder with acute and chronic cholecystitis without obstruction: Principal | ICD-10-CM | POA: Insufficient documentation

## 2023-04-15 DIAGNOSIS — R911 Solitary pulmonary nodule: Secondary | ICD-10-CM | POA: Insufficient documentation

## 2023-04-15 DIAGNOSIS — G8929 Other chronic pain: Secondary | ICD-10-CM | POA: Diagnosis present

## 2023-04-15 HISTORY — PX: CHOLECYSTECTOMY: SHX55

## 2023-04-15 LAB — COMPREHENSIVE METABOLIC PANEL
ALT: 50 U/L — ABNORMAL HIGH (ref 0–44)
AST: 121 U/L — ABNORMAL HIGH (ref 15–41)
Albumin: 4 g/dL (ref 3.5–5.0)
Alkaline Phosphatase: 80 U/L (ref 38–126)
Anion gap: 11 (ref 5–15)
BUN: 16 mg/dL (ref 6–20)
CO2: 24 mmol/L (ref 22–32)
Calcium: 9.1 mg/dL (ref 8.9–10.3)
Chloride: 100 mmol/L (ref 98–111)
Creatinine, Ser: 1.03 mg/dL (ref 0.61–1.24)
GFR, Estimated: >60 mL/min (ref >60)
Glucose, Bld: 220 mg/dL — ABNORMAL HIGH (ref 70–99)
Potassium: 3.8 mmol/L (ref 3.5–5.1)
Sodium: 135 mmol/L (ref 135–145)
Total Bilirubin: 3 mg/dL — ABNORMAL HIGH (ref 0.3–1.2)
Total Protein: 7.7 g/dL (ref 6.5–8.1)

## 2023-04-15 LAB — BILIRUBIN, FRACTIONATED(TOT/DIR/INDIR)
Bilirubin, Direct: 2.7 mg/dL — ABNORMAL HIGH (ref 0.0–0.2)
Indirect Bilirubin: 1.4 mg/dL — ABNORMAL HIGH (ref 0.3–0.9)
Total Bilirubin: 4.1 mg/dL — ABNORMAL HIGH (ref 0.3–1.2)

## 2023-04-15 LAB — HEPATITIS PANEL, ACUTE
HCV Ab: NONREACTIVE
Hep A IgM: NONREACTIVE
Hep B C IgM: NONREACTIVE
Hepatitis B Surface Ag: NONREACTIVE

## 2023-04-15 LAB — CBC WITH DIFFERENTIAL/PLATELET
Abs Immature Granulocytes: 0.1 10*3/uL — ABNORMAL HIGH (ref 0.00–0.07)
Basophils Absolute: 0 10*3/uL (ref 0.0–0.1)
Basophils Relative: 0 %
Eosinophils Absolute: 0.2 10*3/uL (ref 0.0–0.5)
Eosinophils Relative: 2 %
HCT: 38.2 % — ABNORMAL LOW (ref 39.0–52.0)
Hemoglobin: 12.5 g/dL — ABNORMAL LOW (ref 13.0–17.0)
Immature Granulocytes: 1 %
Lymphocytes Relative: 6 %
Lymphs Abs: 0.6 10*3/uL — ABNORMAL LOW (ref 0.7–4.0)
MCH: 34.8 pg — ABNORMAL HIGH (ref 26.0–34.0)
MCHC: 32.7 g/dL (ref 30.0–36.0)
MCV: 106.4 fL — ABNORMAL HIGH (ref 80.0–100.0)
Monocytes Absolute: 0.2 10*3/uL (ref 0.1–1.0)
Monocytes Relative: 2 %
Neutro Abs: 10 10*3/uL — ABNORMAL HIGH (ref 1.7–7.7)
Neutrophils Relative %: 89 %
Platelets: 202 10*3/uL (ref 150–400)
RBC: 3.59 MIL/uL — ABNORMAL LOW (ref 4.22–5.81)
RDW: 12.6 % (ref 11.5–15.5)
WBC: 11.2 10*3/uL — ABNORMAL HIGH (ref 4.0–10.5)
nRBC: 0 % (ref 0.0–0.2)

## 2023-04-15 LAB — TROPONIN I (HIGH SENSITIVITY)
Troponin I (High Sensitivity): 3 ng/L (ref <18)
Troponin I (High Sensitivity): 3 ng/L (ref <18)

## 2023-04-15 LAB — LIPASE, BLOOD: Lipase: 36 U/L (ref 11–51)

## 2023-04-15 LAB — HIV ANTIBODY (ROUTINE TESTING W REFLEX): HIV Screen 4th Generation wRfx: NONREACTIVE

## 2023-04-15 LAB — LACTIC ACID, PLASMA
Lactic Acid, Venous: 0.9 mmol/L (ref 0.5–1.9)
Lactic Acid, Venous: 1.6 mmol/L (ref 0.5–1.9)

## 2023-04-15 SURGERY — LAPAROSCOPIC CHOLECYSTECTOMY WITH INTRAOPERATIVE CHOLANGIOGRAM
Anesthesia: General

## 2023-04-15 MED ORDER — LACTATED RINGERS IR SOLN
Status: DC | PRN
  Administered 2023-04-15: 1000 mL

## 2023-04-15 MED ORDER — OXYCODONE HCL 5 MG PO TABS
10.0000 mg | ORAL_TABLET | ORAL | Status: DC | PRN
  Administered 2023-04-15 – 2023-04-17 (×9): 10 mg via ORAL
  Filled 2023-04-15 (×9): qty 2

## 2023-04-15 MED ORDER — OXYCODONE HCL 5 MG PO TABS
ORAL_TABLET | ORAL | Status: AC
  Filled 2023-04-15: qty 2

## 2023-04-15 MED ORDER — KETOROLAC TROMETHAMINE 30 MG/ML IJ SOLN
INTRAMUSCULAR | Status: AC
  Filled 2023-04-15: qty 1

## 2023-04-15 MED ORDER — ROCURONIUM BROMIDE 10 MG/ML (PF) SYRINGE
PREFILLED_SYRINGE | INTRAVENOUS | Status: DC | PRN
  Administered 2023-04-15: 40 mg via INTRAVENOUS

## 2023-04-15 MED ORDER — ACETAMINOPHEN 10 MG/ML IV SOLN
INTRAVENOUS | Status: AC
  Filled 2023-04-15: qty 100

## 2023-04-15 MED ORDER — LACTATED RINGERS IV SOLN: INTRAVENOUS | Status: DC

## 2023-04-15 MED ORDER — ONDANSETRON HCL 4 MG/2ML IJ SOLN
INTRAMUSCULAR | Status: DC | PRN
  Administered 2023-04-15: 4 mg via INTRAVENOUS

## 2023-04-15 MED ORDER — HYDROMORPHONE HCL 1 MG/ML IJ SOLN
1.0000 mg | Freq: Once | INTRAMUSCULAR | Status: AC
  Administered 2023-04-15: 1 mg via INTRAVENOUS
  Filled 2023-04-15: qty 1

## 2023-04-15 MED ORDER — BUPIVACAINE HCL (PF) 0.5 % IJ SOLN
INTRAMUSCULAR | Status: DC | PRN
  Administered 2023-04-15: 20 mL

## 2023-04-15 MED ORDER — IOHEXOL 350 MG/ML SOLN
100.0000 mL | Freq: Once | INTRAVENOUS | Status: AC | PRN
Start: 2023-04-15 — End: 2023-04-15
  Administered 2023-04-15: 100 mL via INTRAVENOUS

## 2023-04-15 MED ORDER — MIDAZOLAM HCL 2 MG/2ML IJ SOLN
INTRAMUSCULAR | Status: AC
  Filled 2023-04-15: qty 2

## 2023-04-15 MED ORDER — PIPERACILLIN-TAZOBACTAM 3.375 G IVPB 30 MIN
3.3750 g | Freq: Once | INTRAVENOUS | Status: AC
  Administered 2023-04-15: 3.375 g via INTRAVENOUS
  Filled 2023-04-15: qty 50

## 2023-04-15 MED ORDER — PIPERACILLIN-TAZOBACTAM 3.375 G IVPB
3.3750 g | Freq: Three times a day (TID) | INTRAVENOUS | Status: DC
  Administered 2023-04-15 – 2023-04-17 (×6): 3.375 g via INTRAVENOUS
  Filled 2023-04-15 (×6): qty 50

## 2023-04-15 MED ORDER — PHENYLEPHRINE HCL (PRESSORS) 10 MG/ML IV SOLN
INTRAVENOUS | Status: AC
  Filled 2023-04-15: qty 1

## 2023-04-15 MED ORDER — ONDANSETRON HCL 4 MG/2ML IJ SOLN
4.0000 mg | Freq: Four times a day (QID) | INTRAMUSCULAR | Status: DC | PRN
  Administered 2023-04-15: 4 mg via INTRAVENOUS
  Filled 2023-04-15: qty 2

## 2023-04-15 MED ORDER — ONDANSETRON HCL 4 MG/2ML IJ SOLN: 4.0000 mg | Freq: Once | INTRAMUSCULAR | Status: DC | PRN

## 2023-04-15 MED ORDER — FENTANYL CITRATE (PF) 100 MCG/2ML IJ SOLN
INTRAMUSCULAR | Status: DC | PRN
  Administered 2023-04-15 (×3): 50 ug via INTRAVENOUS
  Administered 2023-04-15: 100 ug via INTRAVENOUS

## 2023-04-15 MED ORDER — SODIUM CHLORIDE 0.9 % IV BOLUS
1000.0000 mL | Freq: Once | INTRAVENOUS | Status: AC
  Administered 2023-04-15: 1000 mL via INTRAVENOUS

## 2023-04-15 MED ORDER — DOCUSATE SODIUM 100 MG PO CAPS: 100.0000 mg | ORAL_CAPSULE | Freq: Two times a day (BID) | ORAL | Status: DC | PRN

## 2023-04-15 MED ORDER — LORAZEPAM 0.5 MG PO TABS: 0.5000 mg | ORAL_TABLET | Freq: Once | ORAL | Status: DC | PRN

## 2023-04-15 MED ORDER — ONDANSETRON HCL 4 MG PO TABS: 4.0000 mg | ORAL_TABLET | Freq: Four times a day (QID) | ORAL | Status: DC | PRN

## 2023-04-15 MED ORDER — FENTANYL CITRATE PF 50 MCG/ML IJ SOSY
25.0000 ug | PREFILLED_SYRINGE | INTRAMUSCULAR | Status: DC | PRN
  Administered 2023-04-15 (×3): 50 ug via INTRAVENOUS

## 2023-04-15 MED ORDER — HYDROMORPHONE HCL 1 MG/ML IJ SOLN
INTRAMUSCULAR | Status: AC
  Filled 2023-04-15: qty 1

## 2023-04-15 MED ORDER — OXYCODONE HCL 5 MG PO TABS: 5.0000 mg | ORAL_TABLET | ORAL | Status: DC | PRN

## 2023-04-15 MED ORDER — CHLORHEXIDINE GLUCONATE 0.12 % MT SOLN
15.0000 mL | Freq: Once | OROMUCOSAL | Status: AC
  Administered 2023-04-15: 15 mL via OROMUCOSAL

## 2023-04-15 MED ORDER — PHENYLEPHRINE 80 MCG/ML (10ML) SYRINGE FOR IV PUSH (FOR BLOOD PRESSURE SUPPORT)
PREFILLED_SYRINGE | INTRAVENOUS | Status: DC | PRN
  Administered 2023-04-15: 160 ug via INTRAVENOUS

## 2023-04-15 MED ORDER — SODIUM CHLORIDE (PF) 0.9 % IJ SOLN
INTRAMUSCULAR | Status: AC
  Filled 2023-04-15: qty 50

## 2023-04-15 MED ORDER — SUCCINYLCHOLINE CHLORIDE 200 MG/10ML IV SOSY
PREFILLED_SYRINGE | INTRAVENOUS | Status: DC | PRN
  Administered 2023-04-15: 140 mg via INTRAVENOUS

## 2023-04-15 MED ORDER — ACETAMINOPHEN 325 MG PO TABS: 650.0000 mg | ORAL_TABLET | Freq: Four times a day (QID) | ORAL | Status: DC | PRN

## 2023-04-15 MED ORDER — OXYCODONE HCL 5 MG PO TABS: 5.0000 mg | ORAL_TABLET | Freq: Once | ORAL | Status: DC | PRN

## 2023-04-15 MED ORDER — METOPROLOL SUCCINATE ER 25 MG PO TB24
25.0000 mg | ORAL_TABLET | Freq: Every day | ORAL | Status: DC
  Administered 2023-04-15: 25 mg via ORAL
  Filled 2023-04-15: qty 1

## 2023-04-15 MED ORDER — BUPIVACAINE HCL (PF) 0.5 % IJ SOLN
INTRAMUSCULAR | Status: AC
  Filled 2023-04-15: qty 30

## 2023-04-15 MED ORDER — MORPHINE SULFATE (PF) 4 MG/ML IV SOLN
4.0000 mg | Freq: Once | INTRAVENOUS | Status: AC
  Administered 2023-04-15: 4 mg via INTRAVENOUS
  Filled 2023-04-15: qty 1

## 2023-04-15 MED ORDER — PANTOPRAZOLE SODIUM 40 MG PO TBEC
40.0000 mg | DELAYED_RELEASE_TABLET | Freq: Every day | ORAL | Status: DC
  Administered 2023-04-15 – 2023-04-17 (×3): 40 mg via ORAL
  Filled 2023-04-15 (×3): qty 1

## 2023-04-15 MED ORDER — HYDROMORPHONE HCL 1 MG/ML IJ SOLN
0.5000 mg | INTRAMUSCULAR | Status: DC | PRN
  Administered 2023-04-15 (×2): 1 mg via INTRAVENOUS
  Filled 2023-04-15 (×2): qty 1

## 2023-04-15 MED ORDER — DEXAMETHASONE SODIUM PHOSPHATE 10 MG/ML IJ SOLN
INTRAMUSCULAR | Status: DC | PRN
  Administered 2023-04-15: 10 mg via INTRAVENOUS

## 2023-04-15 MED ORDER — KETOROLAC TROMETHAMINE 30 MG/ML IJ SOLN
30.0000 mg | Freq: Once | INTRAMUSCULAR | Status: AC
  Administered 2023-04-15: 30 mg via INTRAVENOUS

## 2023-04-15 MED ORDER — ENOXAPARIN SODIUM 40 MG/0.4ML IJ SOSY
40.0000 mg | PREFILLED_SYRINGE | Freq: Every day | INTRAMUSCULAR | Status: DC
  Administered 2023-04-15 – 2023-04-17 (×3): 40 mg via SUBCUTANEOUS
  Filled 2023-04-15 (×3): qty 0.4

## 2023-04-15 MED ORDER — HYDROMORPHONE HCL 1 MG/ML IJ SOLN
0.5000 mg | INTRAMUSCULAR | Status: DC | PRN
  Administered 2023-04-15 – 2023-04-17 (×4): 1 mg via INTRAVENOUS
  Filled 2023-04-15 (×4): qty 1

## 2023-04-15 MED ORDER — ALBUMIN HUMAN 5 % IV SOLN: INTRAVENOUS | Status: DC | PRN

## 2023-04-15 MED ORDER — OXYCODONE HCL 5 MG PO TABS
10.0000 mg | ORAL_TABLET | Freq: Once | ORAL | Status: AC
  Administered 2023-04-15: 10 mg via ORAL

## 2023-04-15 MED ORDER — IOHEXOL 300 MG/ML  SOLN
INTRAMUSCULAR | Status: DC | PRN
  Administered 2023-04-15: 35 mL

## 2023-04-15 MED ORDER — ETOMIDATE 2 MG/ML IV SOLN
INTRAVENOUS | Status: DC | PRN
  Administered 2023-04-15: 16 mg via INTRAVENOUS

## 2023-04-15 MED ORDER — POLYETHYLENE GLYCOL 3350 17 G PO PACK: 17.0000 g | PACK | Freq: Every day | ORAL | Status: DC | PRN

## 2023-04-15 MED ORDER — PHENYLEPHRINE HCL-NACL 20-0.9 MG/250ML-% IV SOLN
INTRAVENOUS | Status: DC | PRN
  Administered 2023-04-15: 20 ug/min via INTRAVENOUS

## 2023-04-15 MED ORDER — MIDAZOLAM HCL 5 MG/5ML IJ SOLN
INTRAMUSCULAR | Status: DC | PRN
  Administered 2023-04-15: 2 mg via INTRAVENOUS

## 2023-04-15 MED ORDER — ACETAMINOPHEN 650 MG RE SUPP: 650.0000 mg | Freq: Four times a day (QID) | RECTAL | Status: DC | PRN

## 2023-04-15 MED ORDER — ACETAMINOPHEN 10 MG/ML IV SOLN
1000.0000 mg | Freq: Once | INTRAVENOUS | Status: AC
  Administered 2023-04-15: 1000 mg via INTRAVENOUS

## 2023-04-15 MED ORDER — ONDANSETRON HCL 4 MG/2ML IJ SOLN
INTRAMUSCULAR | Status: AC
  Filled 2023-04-15: qty 2

## 2023-04-15 MED ORDER — ACETAMINOPHEN 500 MG PO TABS: 1000.0000 mg | ORAL_TABLET | Freq: Four times a day (QID) | ORAL | Status: DC | PRN

## 2023-04-15 MED ORDER — PROPOFOL 10 MG/ML IV BOLUS
INTRAVENOUS | Status: DC | PRN
  Administered 2023-04-15: 20 mg via INTRAVENOUS

## 2023-04-15 MED ORDER — FENTANYL CITRATE PF 50 MCG/ML IJ SOSY
PREFILLED_SYRINGE | INTRAMUSCULAR | Status: AC
  Filled 2023-04-15: qty 2

## 2023-04-15 MED ORDER — OXYCODONE HCL 5 MG/5ML PO SOLN: 5.0000 mg | Freq: Once | ORAL | Status: DC | PRN

## 2023-04-15 MED ORDER — LIDOCAINE 2% (20 MG/ML) 5 ML SYRINGE
INTRAMUSCULAR | Status: DC | PRN
  Administered 2023-04-15: 100 mg via INTRAVENOUS

## 2023-04-15 MED ORDER — SODIUM CHLORIDE 0.9 % IV SOLN: 3.3750 g | Freq: Three times a day (TID) | INTRAVENOUS | Status: DC

## 2023-04-15 MED ORDER — PROPOFOL 10 MG/ML IV BOLUS
INTRAVENOUS | Status: AC
  Filled 2023-04-15: qty 20

## 2023-04-15 MED ORDER — HYDROMORPHONE HCL 1 MG/ML IJ SOLN
0.2500 mg | INTRAMUSCULAR | Status: DC | PRN
  Administered 2023-04-15 (×4): 0.5 mg via INTRAVENOUS

## 2023-04-15 MED ORDER — OXYCODONE HCL 5 MG PO TABS: 10.0000 mg | ORAL_TABLET | ORAL | Status: DC | PRN

## 2023-04-15 MED ORDER — FENTANYL CITRATE PF 50 MCG/ML IJ SOSY
PREFILLED_SYRINGE | INTRAMUSCULAR | Status: AC
  Filled 2023-04-15: qty 1

## 2023-04-15 MED ORDER — SUGAMMADEX SODIUM 200 MG/2ML IV SOLN
INTRAVENOUS | Status: DC | PRN
  Administered 2023-04-15: 200 mg via INTRAVENOUS

## 2023-04-15 MED ORDER — FENTANYL CITRATE (PF) 250 MCG/5ML IJ SOLN
INTRAMUSCULAR | Status: AC
  Filled 2023-04-15: qty 5

## 2023-04-15 SURGICAL SUPPLY — 74 items
ADH SKN CLS APL DERMABOND .7 (GAUZE/BANDAGES/DRESSINGS) ×1
ANTIFOG SOL W/FOAM PAD STRL (MISCELLANEOUS) ×1
APL PRP STRL LF DISP 70% ISPRP (MISCELLANEOUS) ×1
APPLIER CLIP 5 13 M/L LIGAMAX5 (MISCELLANEOUS) ×1
APPLIER CLIP ROT 10 11.4 M/L (STAPLE)
APR CLP MED LRG 11.4X10 (STAPLE)
APR CLP MED LRG 5 ANG JAW (MISCELLANEOUS) ×1
BAG COUNTER SPONGE SURGICOUNT (BAG) IMPLANT
BAG SPNG CNTER NS LX DISP (BAG) ×1
BLADE EXTENDED COATED 6.5IN (ELECTRODE) IMPLANT
BLADE HEX COATED 2.75 (ELECTRODE) IMPLANT
BLADE SURG SZ10 CARB STEEL (BLADE) IMPLANT
CABLE HIGH FREQUENCY MONO STRZ (ELECTRODE) ×1 IMPLANT
CHLORAPREP W/TINT 26 (MISCELLANEOUS) ×1 IMPLANT
CLIP APPLIE 5 13 M/L LIGAMAX5 (MISCELLANEOUS) ×1 IMPLANT
CLIP APPLIE ROT 10 11.4 M/L (STAPLE) IMPLANT
COVER MAYO STAND STRL (DRAPES) ×1 IMPLANT
COVER MAYO STAND XLG (MISCELLANEOUS) ×1 IMPLANT
COVER SURGICAL LIGHT HANDLE (MISCELLANEOUS) ×1 IMPLANT
DERMABOND ADVANCED .7 DNX12 (GAUZE/BANDAGES/DRESSINGS) ×1 IMPLANT
DRAPE C-ARM 42X120 X-RAY (DRAPES) IMPLANT
DRAPE WARM FLUID 44X44 (DRAPES) IMPLANT
ELECT REM PT RETURN 15FT ADLT (MISCELLANEOUS) ×1 IMPLANT
GAUZE SPONGE 4X4 12PLY STRL (GAUZE/BANDAGES/DRESSINGS) ×1 IMPLANT
GLOVE BIO SURGEON STRL SZ7 (GLOVE) ×1 IMPLANT
GLOVE BIO SURGEON STRL SZ7.5 (GLOVE) ×1 IMPLANT
GLOVE BIOGEL M 7.0 STRL (GLOVE) ×1 IMPLANT
GLOVE BIOGEL PI IND STRL 7.0 (GLOVE) ×1 IMPLANT
GLOVE BIOGEL PI IND STRL 7.5 (GLOVE) ×1 IMPLANT
GOWN STRL REUS W/ TWL XL LVL3 (GOWN DISPOSABLE) ×2 IMPLANT
GOWN STRL REUS W/TWL XL LVL3 (GOWN DISPOSABLE) ×2
HANDLE SUCTION POOLE (INSTRUMENTS) IMPLANT
HEMOSTAT SNOW SURGICEL 2X4 (HEMOSTASIS) IMPLANT
HEMOSTAT SURGICEL 4X8 (HEMOSTASIS) IMPLANT
IRRIG SUCT STRYKERFLOW 2 WTIP (MISCELLANEOUS) ×1
IRRIGATION SUCT STRKRFLW 2 WTP (MISCELLANEOUS) ×1 IMPLANT
KIT BASIN OR (CUSTOM PROCEDURE TRAY) ×1 IMPLANT
KIT TURNOVER KIT A (KITS) IMPLANT
NS IRRIG 1000ML POUR BTL (IV SOLUTION) ×1 IMPLANT
PENCIL SMOKE EVACUATOR (MISCELLANEOUS) IMPLANT
SCISSORS LAP 5X35 DISP (ENDOMECHANICALS) ×1 IMPLANT
SET CHOLANGIOGRAPH MIX (MISCELLANEOUS) IMPLANT
SET TUBE SMOKE EVAC HIGH FLOW (TUBING) ×1 IMPLANT
SHEARS HARMONIC ACE PLUS 36CM (ENDOMECHANICALS) IMPLANT
SLEEVE Z-THREAD 5X100MM (TROCAR) ×2 IMPLANT
SOLUTION ANTFG W/FOAM PAD STRL (MISCELLANEOUS) ×1 IMPLANT
SPIKE FLUID TRANSFER (MISCELLANEOUS) ×1 IMPLANT
STAPLER VISISTAT 35W (STAPLE) IMPLANT
STRIP CLOSURE SKIN 1/2X4 (GAUZE/BANDAGES/DRESSINGS) IMPLANT
SUCTION POOLE HANDLE (INSTRUMENTS)
SUT MNCRL AB 4-0 PS2 18 (SUTURE) ×1 IMPLANT
SUT PDS AB 1 TP1 96 (SUTURE) IMPLANT
SUT PROLENE 2 0 KS (SUTURE) IMPLANT
SUT PROLENE 2 0 SH DA (SUTURE) IMPLANT
SUT SILK 2 0 (SUTURE)
SUT SILK 2 0 SH CR/8 (SUTURE) IMPLANT
SUT SILK 2-0 18XBRD TIE 12 (SUTURE) IMPLANT
SUT SILK 3 0 (SUTURE)
SUT SILK 3 0 SH CR/8 (SUTURE) IMPLANT
SUT SILK 3-0 18XBRD TIE 12 (SUTURE) IMPLANT
SYS BAG RETRIEVAL 10MM (BASKET) ×1
SYS LAPSCP GELPORT 120MM (MISCELLANEOUS)
SYSTEM BAG RETRIEVAL 10MM (BASKET) ×1 IMPLANT
SYSTEM LAPSCP GELPORT 120MM (MISCELLANEOUS) IMPLANT
TOWEL OR 17X26 10 PK STRL BLUE (TOWEL DISPOSABLE) ×1 IMPLANT
TOWEL OR NON WOVEN STRL DISP B (DISPOSABLE) ×1 IMPLANT
TRAY FOLEY MTR SLVR 16FR STAT (SET/KITS/TRAYS/PACK) ×1 IMPLANT
TRAY LAPAROSCOPIC (CUSTOM PROCEDURE TRAY) ×1 IMPLANT
TROCAR 11X100 Z THREAD (TROCAR) IMPLANT
TROCAR ADV FIXATION 12X100MM (TROCAR) IMPLANT
TROCAR BALLN 12MMX100 BLUNT (TROCAR) ×1 IMPLANT
TROCAR Z-THREAD OPTICAL 5X100M (TROCAR) ×1 IMPLANT
TROCAR Z-THREAD SLEEVE 11X100 (TROCAR) IMPLANT
YANKAUER SUCT BULB TIP NO VENT (SUCTIONS) IMPLANT

## 2023-04-15 NOTE — Consult Note (Signed)
[facility] Consultation Note  Referring Provider: [facility] Primary Care Physician:  Macy Mis, MD  Reason for Consultation:  abdominal pain  HPI: Christopher Underwood is a 57 y.o. male presenting abdominal pain.  Early hours of this morning, he had acute onset of epigastric and right upper quadrant pain; some radiation to back; some fevers; no prior similar symptoms in past.  He feels a little better now.  Imaging as below.  No hematemesis, melena, hematochezia.  Drinks alcohol very rarely, 4-5 times in past couple years.   Past Medical History:  Diagnosis Date   Arthritis    Attention deficit disorder    Depression    Hypertension     Past Surgical History:  Procedure Laterality Date   cervical fusion with plating x 2  2000   left knee arthroscopy  1996   x 2   lower back surgery  2011   LUMBAR LAMINECTOMY/DECOMPRESSION MICRODISCECTOMY Left 11/16/2017   Procedure: LUMBAR LAMINECTOMY/DECOMPRESSION MICRODISCECTOMY;  Surgeon: Venita Lick, MD;  Location: [facility] OR;  Service: Orthopedics;  Laterality: Left;   right arm surgery for donor skin graft  2000   right foot surgery to clean out arthritis  2011   right heel reconstruction  2000   right heel surgery  to remove screw  2000   right heel surgery for fx  2010   right heel wound I and D for infection   2011    Prior to Admission medications   Medication Sig Start Date End Date Taking? Authorizing Provider  ADDERALL XR 20 MG 24 hr capsule Take 20 mg by mouth every morning. 12/21/19  Yes [provider]  BELBUCA 450 MCG FILM Place 450 mcg under the tongue 2 (two) times daily.  12/25/19  Yes [provider]  gabapentin (NEURONTIN) 100 MG capsule Take 100 mg by mouth 4 (four) times daily.   Yes [provider]  lisinopril (ZESTRIL) 10 MG tablet Take 10 mg by mouth daily. 06/03/20  Yes [provider]  metoprolol succinate (TOPROL-XL) 25 MG 24 hr tablet Take 25 mg by mouth daily.   Yes  [provider]  Multiple Vitamin (MULTIVITAMIN WITH MINERALS) TABS tablet Take 1 tablet by mouth daily.   Yes [provider]  omeprazole (PRILOSEC) 20 MG capsule Take 20 mg by mouth daily. 11/26/19  Yes [provider]  oxyCODONE-acetaminophen (PERCOCET) 10-325 MG tablet Take 1 tablet by mouth every 6 (six) hours as needed for pain.  12/25/19  Yes [provider]  sildenafil (VIAGRA) 50 MG tablet Take 50 mg by mouth as needed for erectile dysfunction. 12/10/22  Yes [provider]  tiZANidine (ZANAFLEX) 4 MG tablet Take 1 tablet by mouth 3 (three) times daily as needed for muscle spasms. 03/03/21  Yes [provider]  vitamin B-12 1000 MCG tablet Take 1 tablet (1,000 mcg total) by mouth daily. 09/18/20  Yes Regalado, Belkys A, MD  hydrOXYzine (ATARAX) 25 MG tablet Take 1 tablet (25 mg total) by mouth at bedtime as needed. Patient not taking: Reported on 04/15/2023 04/14/22   Derwood Kaplan, MD  ondansetron (ZOFRAN ODT) 4 MG disintegrating tablet Take 1 tablet (4 mg total) by mouth every 8 (eight) hours as needed for nausea or vomiting. Patient not taking: Reported on 04/15/2023 07/24/19   Horton, Mayer Masker, MD  thiamine 100 MG tablet Take 1 tablet (100 mg total) by mouth daily. Patient not taking: Reported on 04/15/2023 09/17/20   Alba Cory, MD  Current Facility-Administered Medications  Medication Dose Route Frequency Provider Last Rate Last Admin   acetaminophen (TYLENOL) tablet 650 mg  650 mg Oral Q6H PRN Danford, Earl Lites, MD       Or   acetaminophen (TYLENOL) suppository 650 mg  650 mg Rectal Q6H PRN Danford, Earl Lites, MD       enoxaparin (LOVENOX) injection 40 mg  40 mg Subcutaneous Daily Danford, Earl Lites, MD   40 mg at 04/15/23 1014   HYDROmorphone (DILAUDID) injection 0.5-1 mg  0.5-1 mg Intravenous Q2H PRN Alberteen Sam, MD   1 mg at 04/15/23 1014   lactated ringers infusion   Intravenous Continuous  Danford, Earl Lites, MD 150 mL/hr at 04/15/23 1009 New Bag at 04/15/23 1009   LORazepam (ATIVAN) tablet 0.5 mg  0.5 mg Oral Once PRN Alberteen Sam, MD       metoprolol succinate (TOPROL-XL) 24 hr tablet 25 mg  25 mg Oral Daily Danford, Earl Lites, MD   25 mg at 04/15/23 1015   ondansetron (ZOFRAN) tablet 4 mg  4 mg Oral Q6H PRN Alberteen Sam, MD       Or   ondansetron (ZOFRAN) injection 4 mg  4 mg Intravenous Q6H PRN Alberteen Sam, MD   4 mg at 04/15/23 1014   oxyCODONE (Oxy IR/ROXICODONE) immediate release tablet 10 mg  10 mg Oral Q4H PRN Danford, Earl Lites, MD       pantoprazole (PROTONIX) EC tablet 40 mg  40 mg Oral Daily Danford, Earl Lites, MD   40 mg at 04/15/23 1015    Allergies as of 04/15/2023   (No Known Allergies)    Family History  Problem Relation Age of Onset   Diabetes Other    Hypertension Other    Heart attack Other     Social History   Socioeconomic History   Marital status: Married    Spouse name: Not on file   Number of children: Not on file   Years of education: Not on file   Highest education level: Not on file  Occupational History   Not on file  Tobacco Use   Smoking status: Former    Packs/day: 0.25    Years: 1.00    Additional pack years: 0.00    Total pack years: 0.25    Types: Cigarettes    Quit date: 10/26/1983    Years since quitting: 39.4   Smokeless tobacco: Never  Substance and Sexual Activity   Alcohol use: Yes    Comment: every other day-glass of wine, 2-3 beers a day   Drug use: No   Sexual activity: Not on file  Other Topics Concern   Not on file  Social History Narrative   Not on file   Social Determinants of Health   Financial Resource Strain: Not on file  Food Insecurity: No Food Insecurity (04/15/2023)   Hunger Vital Sign    Worried About Running Out of Food in the Last Year: Never true    Ran Out of Food in the Last Year: Never true  Transportation Needs: No Transportation Needs  (04/15/2023)   PRAPARE - Administrator, Civil Service (Medical): No    Lack of Transportation (Non-Medical): No  Physical Activity: Not on file  Stress: Not on file  Social Connections: Not on file  Intimate Partner Violence: Not At Risk (04/15/2023)   Humiliation, Afraid, Rape, and Kick questionnaire    Fear of Current or Ex-Partner: No  Emotionally Abused: No    Physically Abused: No    Sexually Abused: No    Review of Systems: As per HPI, all others negative  Physical Exam: Vital signs in last 24 hours: Temp:  [97.8 F (36.6 C)-100.9 F (38.3 C)] 100.9 F (38.3 C) (06/21 1005) Pulse Rate:  [84-117] 117 (06/21 1005) Resp:  [11-20] 20 (06/21 1005) BP: (102-142)/(71-89) 102/72 (06/21 1005) SpO2:  [96 %-100 %] 99 % (06/21 1005) Weight:  [74.8 kg] 74.8 kg (06/21 0613) Last BM Date : 04/14/23 General:   Alert,  Well-developed, well-nourished, pleasant and cooperative in NAD Head:  Normocephalic and atraumatic. Eyes:  Sclera clear, no icterus.   Conjunctiva pink. Ears:  Normal auditory acuity. Nose:  No deformity, discharge,  or lesions. Mouth:  No deformity or lesions.  Oropharynx pink & moist. Neck:  Supple; no masses or thyromegaly. Lungs:  No respiratory distress Abdomen:  Soft, moderate epigastric tenderness with voluntary guarding; No masses, hepatosplenomegaly or hernias noted. Without guarding, and without rebound.     Msk:  Symmetrical without gross deformities. Normal posture. Pulses:  Normal pulses noted. Extremities:  Without clubbing or edema. Neurologic:  Alert and  oriented x4;  grossly normal neurologically. Skin:  Intact without significant lesions or rashes. Psych:  Alert and cooperative. Normal mood and affect.   Lab Results: Recent Labs    04/15/23 0628  WBC 11.2*  HGB 12.5*  HCT 38.2*  PLT 202   BMET Recent Labs    04/15/23 0628  NA 135  K 3.8  CL 100  CO2 24  GLUCOSE 220*  BUN 16  CREATININE 1.03  CALCIUM 9.1    LFT Recent Labs    04/15/23 0628  PROT 7.7  ALBUMIN 4.0  AST 121*  ALT 50*  ALKPHOS 80  BILITOT 3.0*   PT/INR No results for input(s): "LABPROT", "INR" in the last 72 hours.  Studies/Results: CT Angio Chest/Abd/Pel for Dissection W and/or W/WO  Result Date: 04/15/2023 CLINICAL DATA:  Thoracoabdominal aortic dissection post open repair. Abdominal pain EXAM: CT ANGIOGRAPHY CHEST, ABDOMEN AND PELVIS TECHNIQUE: Chest CTA 01/11/2020 Multidetector CT imaging through the chest, abdomen and pelvis was performed using the standard protocol during bolus administration of intravenous contrast. Multiplanar reconstructed images and MIPs were obtained and reviewed to evaluate the vascular anatomy. RADIATION DOSE REDUCTION: This exam was performed according to the departmental dose-optimization program which includes automated exposure control, adjustment of the mA and/or kV according to patient size and/or use of iterative reconstruction technique. CONTRAST:  OMNIPAQUE IOHEXOL 350 MG/ML SOLN COMPARISON:  None Available. FINDINGS: CTA CHEST FINDINGS Cardiovascular: Preferential opacification of the thoracic aorta. No evidence of thoracic aortic aneurysm or dissection. Normal heart size. No pericardial effusion. Atheromatous calcification of the aorta and coronaries. Mediastinum/Nodes: Negative for mass or adenopathy Lungs/Pleura: There is no edema, consolidation, effusion, or pneumothorax. Mild subpleural scarring in the right upper lobe laterally since prior. At the inferior aspect there is a 8 x 3 mm nodular component. 4 mm nodule in the left lower lobe on 6:101. Musculoskeletal: No acute finding Review of the MIP images confirms the above findings. CTA ABDOMEN AND PELVIS FINDINGS VASCULAR Aorta: Diffuse atheromatous wall thickening. No dissection or aneurysm Celiac: Patent without evidence of aneurysm, dissection, vasculitis or significant stenosis. SMA: Patent without evidence of aneurysm,  dissection, vasculitis or significant stenosis. Renals: Accessory right renal artery. The renal arteries are smoothly contoured and widely patent IMA: Patent Inflow: Atheromatous plaque without aneurysm, dissection, or significant stenosis. Veins:  Unremarkable Review of the MIP images confirms the above findings. NON-VASCULAR Hepatobiliary: Small focus of gas in the peripheral left upper lobe liver with branching shape.Full gallbladder with calculi. Mild CBD dilatation (11 mm) without calcified stone. Pancreas: Unremarkable. Spleen: Unremarkable. Adrenals/Urinary Tract: Negative adrenals. No hydronephrosis or stone. Unremarkable bladder. Stomach/Bowel: Full stomach containing fluid. No visible bowel inflammation or obstructive process. Vascular/Lymphatic: No acute vascular abnormality. No mass or adenopathy. Reproductive:Retracted right testicle into the inguinal canal. Other: No ascites or pneumoperitoneum. Chronic T12 and L4 superior endplate fractures. Musculoskeletal: No acute abnormalities. Generalized lumbar spine degeneration. Review of the MIP images confirms the above findings. IMPRESSION: 1. No evidence of acute aortic syndrome. 2. Full gallbladder with gallstones and mild CBD dilatation. No calcified choledocholithiasis, correlate with liver function tests. 3. Small bubbles of gas over the left lobe liver, indeterminate between biliary and venous location. No pneumatosis or bowel ischemia noted, consider correlation with lactate and history of sphincterotomy. 4. Fluid distended stomach. 5. Scarring in the right upper lobe with inferior nodular component with borderline nodular appearance inferiorly measuring 8 x 3 mm. Non-contrast chest CT at 6-12 months is recommended. If the nodule is stable at time of repeat CT, then future CT at 18-24 months (from today's scan) is considered optional for low-risk patients, but is recommended for high-risk patients. This recommendation follows the consensus statement:  Guidelines for Management of Incidental Pulmonary Nodules Detected on CT Images: From the Fleischner Society 2017; Radiology 2017; 284:228-243. 6. Atherosclerosis including the coronary arteries. Electronically Signed   By: Tiburcio Pea M.D.   On: 04/15/2023 07:31   DG Chest Port 1 View  Result Date: 04/15/2023 CLINICAL DATA:  Chest pain EXAM: PORTABLE CHEST 1 VIEW COMPARISON:  09/12/2020 FINDINGS: The lungs are clear without focal pneumonia, edema, pneumothorax or pleural effusion. Streaky minimal atelectasis noted at the left base. The cardiopericardial silhouette is within normal limits for size. No acute bony abnormality. Telemetry leads overlie the chest. IMPRESSION: No active disease. Electronically Signed   By: Kennith Center M.D.   On: 04/15/2023 06:52    Impression:   Acute onset upper abdominal pain.  Highly consistent with gallstone process.  Pneumobilia indicates possible CBD stone or passed CBD stone.  Question cholangitis. Elevated LFTs. Prior alcohol abuse; now for past couple years drinks very sparingly; I don't think this has anything to do with his current acute symptoms.  Plan:   Aggressive IVF. Antibiotics. NPO except sips pending MRI. MRI/MRCP. Follow LFTs. Surgical consult. [facility] will follow; if MRCP negative for choledocholithiasis, perhaps surgical team could consider cholecystectomy + IOC.   LOS: 0 days   Ashlan Dignan M  04/15/2023, 11:36 AM  Cell [phone] If no answer or after 5 PM call [phone]

## 2023-04-15 NOTE — Assessment & Plan Note (Signed)
-   Hold lisinopril - Continue metoprolol

## 2023-04-15 NOTE — ED Notes (Signed)
ED TO INPATIENT HANDOFF REPORT  Name/Age/Gender Christopher Underwood 57 y.o. male  Code Status    Code Status Orders  (From admission, onward)           Start     Ordered   04/15/23 0900  Full code  Continuous       Question:  By:  Answer:  Consent: discussion documented in EHR   04/15/23 0901           Code Status History     Date Active Date Inactive Code Status Order ID Comments User Context   09/12/2020 2137 09/17/2020 2339 Full Code [id number]  Omar Person, MD ED   01/12/2020 0229 01/14/2020 1626 Full Code [id number]  Briscoe Deutscher, MD Inpatient   11/16/2017 2050 11/17/2017 1759 Full Code [id number]  Mayo, Baxter Kail, PA-C Inpatient   11/15/2017 1549 11/16/2017 2050 Full Code [id number]  Mayo, Baxter Kail, PA-C Inpatient   06/18/2016 1951 06/19/2016 0118 Full Code [id number]  Charlestine Night, PA-C ED   06/15/2016 2342 06/17/2016 0906 Full Code [id number]  Hillary Bow, DO ED       Home/SNF/Other Home  Chief Complaint Transaminitis [R74.01]  Level of Care/Admitting Diagnosis ED Disposition     ED Disposition  Admit   Condition  --   Comment  Hospital Area: [facility] [[id number]]  Level of Care: Med-Surg [16]  May place patient in observation at [facility] or [facility] if equivalent level of care is available:: Yes  Covid Evaluation: Asymptomatic - no recent exposure (last 10 days) testing not required  Diagnosis: Transaminitis [287136]  Admitting Physician: Alberteen Sam [[id number]]  Attending Physician: Alberteen Sam [[id number]]          Medical History Past Medical History:  Diagnosis Date   Arthritis    Attention deficit disorder    Depression    Hypertension     Allergies No Known Allergies  IV Location/Drains/Wounds Patient Lines/Drains/Airways Status     Active Line/Drains/Airways     Name Placement date Placement time Site Days   Peripheral IV 04/15/23 20 G 1" Left Antecubital 04/15/23   0635  Antecubital  less than 1   Incision (Closed) 11/16/17 Back Other (Comment) 11/16/17  1813  -- 1976            Labs/Imaging Results for orders placed or performed during the hospital encounter of 04/15/23 (from the past 48 hour(s))  Comprehensive metabolic panel     Status: Abnormal   Collection Time: 04/15/23  6:28 AM  Result Value Ref Range   Sodium 135 135 - 145 mmol/L   Potassium 3.8 3.5 - 5.1 mmol/L   Chloride 100 98 - 111 mmol/L   CO2 24 22 - 32 mmol/L   Glucose, Bld 220 (H) 70 - 99 mg/dL    Comment: Glucose reference range applies only to samples taken after fasting for at least 8 hours.   BUN 16 6 - 20 mg/dL   Creatinine, Ser 5.63 0.61 - 1.24 mg/dL   Calcium 9.1 8.9 - 87.5 mg/dL   Total Protein 7.7 6.5 - 8.1 g/dL   Albumin 4.0 3.5 - 5.0 g/dL   AST 643 (H) 15 - 41 U/L   ALT 50 (H) 0 - 44 U/L   Alkaline Phosphatase 80 38 - 126 U/L   Total Bilirubin 3.0 (H) 0.3 - 1.2 mg/dL   GFR, Estimated >32 >95 mL/min    Comment: (NOTE) Calculated using the CKD-EPI Creatinine Equation (  2021)    Anion gap 11 5 - 15    Comment: Performed at [facility], [address] [place], [place] [address]  Troponin I (High Sensitivity)     Status: None   Collection Time: 04/15/23  6:28 AM  Result Value Ref Range   Troponin I (High Sensitivity) 3 <18 ng/L    Comment: (NOTE) Elevated high sensitivity troponin I (hsTnI) values and significant  changes across serial measurements may suggest ACS but many other  chronic and acute conditions are known to elevate hsTnI results.  Refer to the "Links" section for chest pain algorithms and additional  guidance. Performed at [facility], [address] [place], [place] [address]   CBC with Differential     Status: Abnormal   Collection Time: 04/15/23  6:28 AM  Result Value Ref Range   WBC 11.2 (H) 4.0 - 10.5 K/uL   RBC 3.59 (L) 4.22 - 5.81 MIL/uL   Hemoglobin 12.5 (L) 13.0 - 17.0 g/dL   HCT 41.3 (L)  24.4 - 52.0 %   MCV 106.4 (H) 80.0 - 100.0 fL   MCH 34.8 (H) 26.0 - 34.0 pg   MCHC 32.7 30.0 - 36.0 g/dL   RDW 01.0 27.2 - 53.6 %   Platelets 202 150 - 400 K/uL   nRBC 0.0 0.0 - 0.2 %   Neutrophils Relative % 89 %   Neutro Abs 10.0 (H) 1.7 - 7.7 K/uL   Lymphocytes Relative 6 %   Lymphs Abs 0.6 (L) 0.7 - 4.0 K/uL   Monocytes Relative 2 %   Monocytes Absolute 0.2 0.1 - 1.0 K/uL   Eosinophils Relative 2 %   Eosinophils Absolute 0.2 0.0 - 0.5 K/uL   Basophils Relative 0 %   Basophils Absolute 0.0 0.0 - 0.1 K/uL   Immature Granulocytes 1 %   Abs Immature Granulocytes 0.10 (H) 0.00 - 0.07 K/uL    Comment: Performed at [facility], [address], [place], [place] [address]  Lipase, blood     Status: None   Collection Time: 04/15/23  6:28 AM  Result Value Ref Range   Lipase 36 11 - 51 U/L    Comment: Performed at [facility], [address] [place], [place] [address]  Lactic acid, plasma     Status: None   Collection Time: 04/15/23  7:37 AM  Result Value Ref Range   Lactic Acid, Venous 0.9 0.5 - 1.9 mmol/L    Comment: Performed at [facility], [address] [place], [place] [address]   CT Angio Chest/Abd/Pel for Dissection W and/or W/WO  Result Date: 04/15/2023 CLINICAL DATA:  Thoracoabdominal aortic dissection post open repair. Abdominal pain EXAM: CT ANGIOGRAPHY CHEST, ABDOMEN AND PELVIS TECHNIQUE: Chest CTA 01/11/2020 Multidetector CT imaging through the chest, abdomen and pelvis was performed using the standard protocol during bolus administration of intravenous contrast. Multiplanar reconstructed images and MIPs were obtained and reviewed to evaluate the vascular anatomy. RADIATION DOSE REDUCTION: This exam was performed according to the departmental dose-optimization program which includes automated exposure control, adjustment of the mA and/or kV according to patient size and/or use of iterative reconstruction technique.  CONTRAST:  OMNIPAQUE IOHEXOL 350 MG/ML SOLN COMPARISON:  None Available. FINDINGS: CTA CHEST FINDINGS Cardiovascular: Preferential opacification of the thoracic aorta. No evidence of thoracic aortic aneurysm or dissection. Normal heart size. No pericardial effusion. Atheromatous calcification of the aorta and coronaries. Mediastinum/Nodes: Negative for mass or adenopathy Lungs/Pleura:  There is no edema, consolidation, effusion, or pneumothorax. Mild subpleural scarring in the right upper lobe laterally since prior. At the inferior aspect there is a 8 x 3 mm nodular component. 4 mm nodule in the left lower lobe on 6:101. Musculoskeletal: No acute finding Review of the MIP images confirms the above findings. CTA ABDOMEN AND PELVIS FINDINGS VASCULAR Aorta: Diffuse atheromatous wall thickening. No dissection or aneurysm Celiac: Patent without evidence of aneurysm, dissection, vasculitis or significant stenosis. SMA: Patent without evidence of aneurysm, dissection, vasculitis or significant stenosis. Renals: Accessory right renal artery. The renal arteries are smoothly contoured and widely patent IMA: Patent Inflow: Atheromatous plaque without aneurysm, dissection, or significant stenosis. Veins: Unremarkable Review of the MIP images confirms the above findings. NON-VASCULAR Hepatobiliary: Small focus of gas in the peripheral left upper lobe liver with branching shape.Full gallbladder with calculi. Mild CBD dilatation (11 mm) without calcified stone. Pancreas: Unremarkable. Spleen: Unremarkable. Adrenals/Urinary Tract: Negative adrenals. No hydronephrosis or stone. Unremarkable bladder. Stomach/Bowel: Full stomach containing fluid. No visible bowel inflammation or obstructive process. Vascular/Lymphatic: No acute vascular abnormality. No mass or adenopathy. Reproductive:Retracted right testicle into the inguinal canal. Other: No ascites or pneumoperitoneum. Chronic T12 and L4 superior endplate fractures.  Musculoskeletal: No acute abnormalities. Generalized lumbar spine degeneration. Review of the MIP images confirms the above findings. IMPRESSION: 1. No evidence of acute aortic syndrome. 2. Full gallbladder with gallstones and mild CBD dilatation. No calcified choledocholithiasis, correlate with liver function tests. 3. Small bubbles of gas over the left lobe liver, indeterminate between biliary and venous location. No pneumatosis or bowel ischemia noted, consider correlation with lactate and history of sphincterotomy. 4. Fluid distended stomach. 5. Scarring in the right upper lobe with inferior nodular component with borderline nodular appearance inferiorly measuring 8 x 3 mm. Non-contrast chest CT at 6-12 months is recommended. If the nodule is stable at time of repeat CT, then future CT at 18-24 months (from today's scan) is considered optional for low-risk patients, but is recommended for high-risk patients. This recommendation follows the consensus statement: Guidelines for Management of Incidental Pulmonary Nodules Detected on CT Images: From the Fleischner Society 2017; Radiology 2017; 284:228-243. 6. Atherosclerosis including the coronary arteries. Electronically Signed   By: Tiburcio Pea M.D.   On: 04/15/2023 07:31   DG Chest Port 1 View  Result Date: 04/15/2023 CLINICAL DATA:  Chest pain EXAM: PORTABLE CHEST 1 VIEW COMPARISON:  09/12/2020 FINDINGS: The lungs are clear without focal pneumonia, edema, pneumothorax or pleural effusion. Streaky minimal atelectasis noted at the left base. The cardiopericardial silhouette is within normal limits for size. No acute bony abnormality. Telemetry leads overlie the chest. IMPRESSION: No active disease. Electronically Signed   By: Kennith Center M.D.   On: 04/15/2023 06:52    Pending Labs Unresulted Labs (From admission, onward)     Start     Ordered   04/15/23 0737  Lactic acid, plasma  Now then every 2 hours,   R (with STAT occurrences)      04/15/23  0736   Signed and Held  HIV Antibody (routine testing w rflx)  (HIV Antibody (Routine testing w reflex) panel)  Once,   R        Signed and Held   Signed and Held  Bilirubin, fractionated(tot/dir/indir)  Add-on,   R        Signed and Held   Signed and Held  Creatinine, serum  (enoxaparin (LOVENOX)    CrCl >/= 30 ml/min)  Weekly,  R     Comments: while on enoxaparin therapy    Signed and Held   Signed and Held  Comprehensive metabolic panel  Tomorrow morning,   R        Signed and Held   Signed and Held  CBC  Tomorrow morning,   R        Signed and Held   Signed and Held  Hepatitis panel, acute  Once,   R        Signed and Held            Vitals/Pain Today's Vitals   04/15/23 0645 04/15/23 0708 04/15/23 0715 04/15/23 0730  BP: 115/82  123/87 118/71  Pulse: (!) 106  (!) 112 (!) 114  Resp: 16  11 11   Temp:      TempSrc:      SpO2: 99%  100% 96%  Weight:      Height:      PainSc:  10-Worst pain ever      Isolation Precautions No active isolations  Medications Medications  morphine (PF) 4 MG/ML injection 4 mg (4 mg Intravenous Given 04/15/23 0638)  iohexol (OMNIPAQUE) 350 MG/ML injection 100 mL (100 mLs Intravenous Contrast Given 04/15/23 0655)  HYDROmorphone (DILAUDID) injection 1 mg (1 mg Intravenous Given 04/15/23 0713)  piperacillin-tazobactam (ZOSYN) IVPB 3.375 g (3.375 g Intravenous New Bag/Given 04/15/23 0800)  sodium chloride 0.9 % bolus 1,000 mL (1,000 mLs Intravenous New Bag/Given 04/15/23 0800)  HYDROmorphone (DILAUDID) injection 1 mg (1 mg Intravenous Given 04/15/23 0806)    Mobility walks

## 2023-04-15 NOTE — Progress Notes (Signed)
Transition of Care [facility]) - Inpatient Brief Assessment   Patient Details  Name: Christopher Underwood MRN: [id number] Date of Birth: Sep 11, 1966  [facility] CM/SW Contact:    Larrie Kass, LCSW Phone Number: 04/15/2023, 1:36 PM   Clinical Narrative:  Transition of Care Department [facility]) has reviewed patient and no TOC needs have been identified at this time. We will continue to monitor patient advancement through interdisciplinary progression rounds. If new patient transition needs arise, please place a TOC consult.  Transition of Care Asessment: Insurance and Status: Insurance coverage has been reviewed Patient has primary care physician: Yes Home environment has been reviewed: yes   Prior/Current Home Services: No current home services Social Determinants of Health Reivew: SDOH reviewed no interventions necessary Readmission risk has been reviewed: Yes Transition of care needs: no transition of care needs at this time

## 2023-04-15 NOTE — Transfer of Care (Signed)
Immediate Anesthesia Transfer of Care Note  Patient: Christopher Underwood  Procedure(s) Performed: LAPAROSCOPIC CHOLECYSTECTOMY WITH INTRAOPERATIVE CHOLANGIOGRAM  Patient Location: PACU  Anesthesia Type:General  Level of Consciousness: awake, alert , and oriented  Airway & Oxygen Therapy: Patient Spontanous Breathing and Patient connected to face mask oxygen  Post-op Assessment: Report given to RN and Post -op Vital signs reviewed and stable  Post vital signs: Reviewed and stable  Last Vitals:  Vitals Value Taken Time  BP 133/84 04/15/23 1638  Temp    Pulse 107 04/15/23 1640  Resp 13 04/15/23 1640  SpO2 100 % 04/15/23 1640  Vitals shown include unvalidated device data.  Last Pain:  Vitals:   04/15/23 1500  TempSrc: Oral  PainSc:          Complications: No notable events documented.

## 2023-04-15 NOTE — ED Triage Notes (Signed)
Pt arrives with sudden onset chest pain and back pain that began at 2am this morning. Pt reports pain into abdomen as well. Pt reports pain began in chest and back.

## 2023-04-15 NOTE — Op Note (Signed)
Christopher Underwood 04/15/2023   Pre-op Diagnosis: ACUTE CHOLECYSTITIS WITH CHOLELITHIASIS     Post-op Diagnosis: SAME  Procedure(s): LAPAROSCOPIC CHOLECYSTECTOMY WITH INTRAOPERATIVE CHOLANGIOGRAM  Surgeon(s): Abigail Miyamoto, MD  Anesthesia: General  Staff:  Circulator: Loletta Parish, RN Scrub Person: Tilden Fossa; Pershing Cox M  Estimated Blood Loss: Minimal               Specimens: sent to path  Indications: This is a 57 year old gentleman who present with abdominal pain.  He was found to have an elevated bilirubin and elevated white blood count as well as an ultrasound CT scan showing distended gallbladder with dilation of the common bile duct.  He underwent an urgent MRCP which did not show any stone in the bile duct so the decision was made to proceed to the operating room for laparoscopic cholecystectomy and cholangiogram  Findings: The patient have a large, distended, acutely inflamed gallbladder.  Cholangiogram was performed and contrast flowed through the bile duct and into the duodenum without obvious evidence of a large stone.  Radiology's interpretation of the cholangiogram is pending  Procedure: The patient was brought to the operating identified as a correct patient.  He was placed upon the operating table and general anesthesia was induced.  His abdomen was then prepped and draped in usual sterile fashion.  I made a small vertical incision below the umbilicus with a scalpel.  I carried this down to the fascia which was then opened with a scalpel as well.  A hemostat was then used to gain entry to the peritoneal cavity under direct vision.  A 0 Vicryl pursestring suture was placed around the fascial opening.  The [facility] port was placed at the opening and insufflation of the abdomen was begun.  I placed a 5 mm trocar in the patient's epigastrium and 2 more in the right upper quadrant all under direct vision.  The gallbladder was found to be distended and acutely  inflamed.  The liver appeared normal.  I saw no other obvious intra-abdominal pathology.  I was able to grasp the gallbladder retracted above the liver bed.  Adhesions of the gallbladder then taken down bluntly.  The cystic duct was dissected out and the critical window was achieved around it.  I dissected out the cystic artery as well.  I clipped the cystic duct once distally and opened it with a laparoscopic scissors.  I then introduced a cholangiocatheter through a small incision in the patient's right upper quadrant under direct vision.  The catheter was placed into the cystic duct opening.  A cholangiogram was performed showing some dilation of the common bile duct but no obvious obstructing gallstone distally with contrast going into the duodenum.  I am remove the cholangiocatheter.  I clipped the cystic duct 3 times proximally and transected it.  The cystic artery was clipped twice proximally distally and transected as well.  The gallbladder was then slowly dissected free from the liver bed.  Once it was freed from the liver bed was placed in an Endosac and removed through the incision at the umbilicus.  I achieved hemostasis in the liver bed with the cautery as well as a piece of surgical snow.  We copiously irrigated the abdomen with saline.  Hemostasis appeared to be achieved.  All ports were then removed under direct vision and the abdomen was deflated.  The 0 Vicryl the umbilicus was tied in place closing the fascial defect.  All incisions were then anesthetized with Marcaine and  closed with 4-0 Monocryl sutures.  Dermabond was then applied.  The patient tolerated the procedure well.  All the counts were correct at the end of the procedure.  The patient was then extubated in the operating room and taken in stable condition to the recovery room.          Abigail Miyamoto   Date: 04/15/2023  Time: 4:30 PM

## 2023-04-15 NOTE — Assessment & Plan Note (Signed)
Incidental finding. - Repeat outpatient CT in 6-12 months

## 2023-04-15 NOTE — Assessment & Plan Note (Signed)
-   Hold home [place] - Continue home vilazodone

## 2023-04-15 NOTE — Progress Notes (Addendum)
Patient AOX4. Remains on RA. Return to unit S/P LAPAROSCOPIC CHOLECYSTECTOMY WITH INTRAOPERATIVE CHOLANGIOGRAM . Up ambulating from stretcher to bed. C/O pain, recently received pain medication in the PACU. Ice pack applied to abdomen. Surgical incisions clean, dry and intact. Will endorse to next shift RN.   04/15/23 1844  Vitals  Temp 98.6 F (37 C)  Temp Source Oral  BP 100/65  MAP (mmHg) 77  BP Location Right Arm  BP Method Automatic  Patient Position (if appropriate) Lying  Pulse Rate 79  Pulse Rate Source Monitor  Resp 16  Level of Consciousness  Level of Consciousness Alert  MEWS COLOR  MEWS Score Color Green  Oxygen Therapy  SpO2 97 %  O2 Device Room Air  Pain Assessment  Pain Scale 0-10  Pain Score 6  Pain Type Surgical pain  Pain Location Abdomen  Pain Orientation Mid  Pain Descriptors / Indicators Aching  Pain Frequency Constant  Pain Onset On-going  Pain Intervention(s) Repositioned;Hot/Cold interventions  MEWS Score  MEWS Temp 0  MEWS Systolic 1  MEWS Pulse 0  MEWS RR 0  MEWS LOC 0  MEWS Score 1

## 2023-04-15 NOTE — ED Provider Notes (Signed)
[place] EMERGENCY DEPARTMENT AT [facility] Provider Note   CSN: [id number] Arrival date & time: 04/15/23  4098     History  Chief Complaint  Patient presents with   Abdominal Pain   Chest Pain    Christopher Underwood is a 57 y.o. male.  With past medical history of hypertension, alcohol use who presents to the emergency department with chest and abdominal pain.  Patient states that symptoms began around 2 AM.  Pain woke him up out of sleep and he describes having epigastric abdominal pain.  He states that then pain began radiating to his chest.  Over a period of about 30 minutes his chest pain worsened and he describes it as pressure.  Pain radiates into his back.  He attempted to go back to sleep but could not find a position of comfort and then began feeling short of breath.  Symptoms have persisted and worsened throughout the morning.  He also describes feeling cold or having chills.  He denies having any nausea or diaphoresis.  Denies numbness or tingling to his arms or legs.   Abdominal Pain Associated symptoms: chest pain and shortness of breath   Chest Pain Associated symptoms: abdominal pain, back pain and shortness of breath        Home Medications Prior to Admission medications   Medication Sig Start Date End Date Taking? Authorizing Provider  ADDERALL XR 20 MG 24 hr capsule Take 20 mg by mouth every morning. 12/21/19   [provider]  BELBUCA 450 MCG FILM Place 450 mcg under the tongue 2 (two) times daily.  12/25/19   [provider]  folic acid (FOLVITE) 1 MG tablet Take 1 tablet (1 mg total) by mouth daily. 09/18/20   Regalado, Belkys A, MD  hydrOXYzine (ATARAX) 25 MG tablet Take 1 tablet (25 mg total) by mouth at bedtime as needed. 04/14/22   Derwood Kaplan, MD  lisinopril (ZESTRIL) 10 MG tablet Take 10 mg by mouth daily. 06/03/20   [provider]  LORazepam (ATIVAN) 0.5 MG tablet Take 1 tablet BID for 2 days then 1 tablet daily for 2  days then stop. 09/17/20   Regalado, Belkys A, MD  metoprolol succinate (TOPROL-XL) 25 MG 24 hr tablet Take 25 mg by mouth daily.    [provider]  Multiple Vitamin (MULTIVITAMIN WITH MINERALS) TABS tablet Take 1 tablet by mouth daily.    [provider]  nicotine (NICODERM CQ - DOSED IN MG/24 HR) 7 mg/24hr patch Place 1 patch (7 mg total) onto the skin daily. 09/18/20   Regalado, Belkys A, MD  omeprazole (PRILOSEC) 20 MG capsule Take 20 mg by mouth daily. 11/26/19   [provider]  ondansetron (ZOFRAN ODT) 4 MG disintegrating tablet Take 1 tablet (4 mg total) by mouth every 8 (eight) hours as needed for nausea or vomiting. 07/24/19   Horton, Mayer Masker, MD  oxyCODONE-acetaminophen (PERCOCET) 10-325 MG tablet Take 1 tablet by mouth every 6 (six) hours as needed for pain.  12/25/19   [provider]  polyethylene glycol (MIRALAX / GLYCOLAX) 17 g packet Take 17 g by mouth daily. Patient not taking: Reported on 09/12/2020 01/14/20   Narda Bonds, MD  thiamine 100 MG tablet Take 1 tablet (100 mg total) by mouth daily. 09/17/20   Regalado, Belkys A, MD  VIIBRYD 20 MG TABS Take 20 mg by mouth daily.  11/22/19   [provider]  vitamin B-12 1000 MCG tablet Take 1  tablet (1,000 mcg total) by mouth daily. 09/18/20   Regalado, Prentiss Bells, MD      Allergies    Patient has no known allergies.    Review of Systems   Review of Systems  Respiratory:  Positive for shortness of breath.   Cardiovascular:  Positive for chest pain.  Gastrointestinal:  Positive for abdominal pain.  Musculoskeletal:  Positive for back pain.  All other systems reviewed and are negative.   Physical Exam Updated Vital Signs BP 118/71   Pulse (!) 114   Temp 97.8 F (36.6 C) (Oral)   Resp 11   Ht 5\' 10"  (1.778 m)   Wt 74.8 kg   SpO2 96%   BMI 23.68 kg/m  Physical Exam Vitals and nursing note reviewed.  Constitutional:      General: He is in acute distress.     Appearance: He  is well-developed. He is ill-appearing.     Comments: Patient is very uncomfortable appearing.  He is tremulous.  He is in pain.  Abdominal exam is limited as he is contracting his abdomen.  It is not distended however.  Not able to appreciate a murmur on exam.  He has lung sounds bilaterally.  HENT:     Head: Normocephalic and atraumatic.  Eyes:     General: No scleral icterus. Cardiovascular:     Rate and Rhythm: Normal rate and regular rhythm.     Heart sounds: Normal heart sounds. No murmur heard. Pulmonary:     Effort: Pulmonary effort is normal. No respiratory distress.     Breath sounds: Normal breath sounds.  Abdominal:     General: Abdomen is flat. There is no distension.     Palpations: Abdomen is soft. There is no pulsatile mass.  Skin:    General: Skin is warm and dry.     Capillary Refill: Capillary refill takes less than 2 seconds.     Findings: No rash.  Neurological:     General: No focal deficit present.     Mental Status: He is alert and oriented to person, place, and time.  Psychiatric:        Mood and Affect: Mood normal.        Behavior: Behavior normal.        Thought Content: Thought content normal.        Judgment: Judgment normal.     ED Results / Procedures / Treatments   Labs (all labs ordered are listed, but only abnormal results are displayed) Labs Reviewed  COMPREHENSIVE METABOLIC PANEL - Abnormal; Notable for the following components:      Result Value   Glucose, Bld 220 (*)    AST 121 (*)    ALT 50 (*)    Total Bilirubin 3.0 (*)    All other components within normal limits  CBC WITH DIFFERENTIAL/PLATELET - Abnormal; Notable for the following components:   WBC 11.2 (*)    RBC 3.59 (*)    Hemoglobin 12.5 (*)    HCT 38.2 (*)    MCV 106.4 (*)    MCH 34.8 (*)    Neutro Abs 10.0 (*)    Lymphs Abs 0.6 (*)    Abs Immature Granulocytes 0.10 (*)    All other components within normal limits  LIPASE, BLOOD  LACTIC ACID, PLASMA  LACTIC ACID,  PLASMA  TROPONIN I (HIGH SENSITIVITY)  TROPONIN I (HIGH SENSITIVITY)    EKG EKG Interpretation  Date/Time:  Friday April 15 2023 06:20:04 EDT Ventricular Rate:  87 PR Interval:  147 QRS Duration: 87 QT Interval:  385 QTC Calculation: 464 R Axis:   32 Text Interpretation: Sinus rhythm Minimal ST depression Borderline ST elevation, lateral leads Artifact in lead(s) I III aVR aVL aVF Artifact limiting read Confirmed by Alona Bene [id number]) on 04/15/2023 6:34:21 AM  Radiology CT Angio Chest/Abd/Pel for Dissection W and/or W/WO  Result Date: 04/15/2023 CLINICAL DATA:  Thoracoabdominal aortic dissection post open repair. Abdominal pain EXAM: CT ANGIOGRAPHY CHEST, ABDOMEN AND PELVIS TECHNIQUE: Chest CTA 01/11/2020 Multidetector CT imaging through the chest, abdomen and pelvis was performed using the standard protocol during bolus administration of intravenous contrast. Multiplanar reconstructed images and MIPs were obtained and reviewed to evaluate the vascular anatomy. RADIATION DOSE REDUCTION: This exam was performed according to the departmental dose-optimization program which includes automated exposure control, adjustment of the mA and/or kV according to patient size and/or use of iterative reconstruction technique. CONTRAST:  OMNIPAQUE IOHEXOL 350 MG/ML SOLN COMPARISON:  None Available. FINDINGS: CTA CHEST FINDINGS Cardiovascular: Preferential opacification of the thoracic aorta. No evidence of thoracic aortic aneurysm or dissection. Normal heart size. No pericardial effusion. Atheromatous calcification of the aorta and coronaries. Mediastinum/Nodes: Negative for mass or adenopathy Lungs/Pleura: There is no edema, consolidation, effusion, or pneumothorax. Mild subpleural scarring in the right upper lobe laterally since prior. At the inferior aspect there is a 8 x 3 mm nodular component. 4 mm nodule in the left lower lobe on 6:101. Musculoskeletal: No acute finding Review of the MIP images  confirms the above findings. CTA ABDOMEN AND PELVIS FINDINGS VASCULAR Aorta: Diffuse atheromatous wall thickening. No dissection or aneurysm Celiac: Patent without evidence of aneurysm, dissection, vasculitis or significant stenosis. SMA: Patent without evidence of aneurysm, dissection, vasculitis or significant stenosis. Renals: Accessory right renal artery. The renal arteries are smoothly contoured and widely patent IMA: Patent Inflow: Atheromatous plaque without aneurysm, dissection, or significant stenosis. Veins: Unremarkable Review of the MIP images confirms the above findings. NON-VASCULAR Hepatobiliary: Small focus of gas in the peripheral left upper lobe liver with branching shape.Full gallbladder with calculi. Mild CBD dilatation (11 mm) without calcified stone. Pancreas: Unremarkable. Spleen: Unremarkable. Adrenals/Urinary Tract: Negative adrenals. No hydronephrosis or stone. Unremarkable bladder. Stomach/Bowel: Full stomach containing fluid. No visible bowel inflammation or obstructive process. Vascular/Lymphatic: No acute vascular abnormality. No mass or adenopathy. Reproductive:Retracted right testicle into the inguinal canal. Other: No ascites or pneumoperitoneum. Chronic T12 and L4 superior endplate fractures. Musculoskeletal: No acute abnormalities. Generalized lumbar spine degeneration. Review of the MIP images confirms the above findings. IMPRESSION: 1. No evidence of acute aortic syndrome. 2. Full gallbladder with gallstones and mild CBD dilatation. No calcified choledocholithiasis, correlate with liver function tests. 3. Small bubbles of gas over the left lobe liver, indeterminate between biliary and venous location. No pneumatosis or bowel ischemia noted, consider correlation with lactate and history of sphincterotomy. 4. Fluid distended stomach. 5. Scarring in the right upper lobe with inferior nodular component with borderline nodular appearance inferiorly measuring 8 x 3 mm. Non-contrast  chest CT at 6-12 months is recommended. If the nodule is stable at time of repeat CT, then future CT at 18-24 months (from today's scan) is considered optional for low-risk patients, but is recommended for high-risk patients. This recommendation follows the consensus statement: Guidelines for Management of Incidental Pulmonary Nodules Detected on CT Images: From the Fleischner Society 2017; Radiology 2017; 284:228-243. 6. Atherosclerosis including the coronary arteries. Electronically Signed   By: Tiburcio Pea M.D.   On: 04/15/2023 07:31  DG Chest Port 1 View  Result Date: 04/15/2023 CLINICAL DATA:  Chest pain EXAM: PORTABLE CHEST 1 VIEW COMPARISON:  09/12/2020 FINDINGS: The lungs are clear without focal pneumonia, edema, pneumothorax or pleural effusion. Streaky minimal atelectasis noted at the left base. The cardiopericardial silhouette is within normal limits for size. No acute bony abnormality. Telemetry leads overlie the chest. IMPRESSION: No active disease. Electronically Signed   By: Kennith Center M.D.   On: 04/15/2023 06:52    Procedures Procedures   Medications Ordered in ED Medications  piperacillin-tazobactam (ZOSYN) IVPB 3.375 g (3.375 g Intravenous New Bag/Given 04/15/23 0800)  morphine (PF) 4 MG/ML injection 4 mg (4 mg Intravenous Given 04/15/23 0638)  iohexol (OMNIPAQUE) 350 MG/ML injection 100 mL (100 mLs Intravenous Contrast Given 04/15/23 0655)  HYDROmorphone (DILAUDID) injection 1 mg (1 mg Intravenous Given 04/15/23 0713)  sodium chloride 0.9 % bolus 1,000 mL (1,000 mLs Intravenous New Bag/Given 04/15/23 0800)  HYDROmorphone (DILAUDID) injection 1 mg (1 mg Intravenous Given 04/15/23 0981)    ED Course/ Medical Decision Making/ A&P  Medical Decision Making Amount and/or Complexity of Data Reviewed Labs: ordered. Radiology: ordered.  Risk Prescription drug management. Decision regarding hospitalization.  Initial Impression and Ddx 57 year old male who presents to the  emergency department with abdominal and chest pain. Patient PMH that increases complexity of ED encounter: Hypertension, alcohol use.  Interpretation of Diagnostics I independent reviewed and interpreted the labs as followed: CBC with mild leukocytosis to 11.2.  Stable anemia.  CMP with transaminitis AST of 121, ALT 50, total bili 3.  Lipase is pending  - I independently visualized the following imaging with scope of interpretation limited to determining acute life threatening conditions related to emergency care: CT dissection study, which revealed no dissection.  He does have a full gallbladder with gallstones and mild CBD dilation.  There is gas over the left lobe liver of unclear etiology.  However on chest x-ray do not see any air under the diaphragm.  Fluid distended stomach.  Patient Reassessment and Ultimate Disposition/Management 57 year old male who presents to the emergency department with chest and abdominal pain.  His initial presentation is very concerning for aortic dissection.  Currently still having chest pain that radiates into the back and abdomen.  I have ordered a CT dissection study to bypass labs and go to medical. Also ordered labs including a troponin, EKG.  EKG has a bunch of artifact from his tremulousness.  Possible ST depressions.  0750: CT without evidence of dissection.  He does have a distended stomach.  He also appears to have gallstones and a full gallbladder and mild CBD dilation.  His labs do show evidence of mild transaminitis and T. bili of 3.  Still pending a lipase.  Consulting general surgery.  I reevaluated the patient.  His pain is improving.  His heart rate is starting to increase into the 1 teens.  I am starting him on a saline bolus as well as Zosyn in the meantime.  When I reevaluated him I asked about alcohol use given his new tachycardia.  He has not had any significant alcohol use in the past 2 years.  Do not feel that his tachycardia and symptoms are  related to withdrawal at this time.  0800: Spoke with Casimiro Needle, PA-C with general surgery who will come evaluate the patient.  He also recommends GI consult and hospitalist admission.  81: Spoke with Dr. [name], hospitalist who agrees to admit the patient.  Patient will be admitted for evaluation  by general surgery, GI.  Do not feel he needs NG tube placement at this time he is having no active vomiting and there is no evidence of obstruction on imaging.  Believe his presentation is related to likely cholecystitis however pending lipase and may be choledocholithiasis.  He will need ongoing evaluation by general surgery and gastroenterology.  He is continuing to get IV fluids as well as pain control with IV Dilaudid.  Patient management required discussion with the following services or consulting groups:  Hospitalist Service, Gastroenterology, and General/Trauma Surgery  Complexity of Problems Addressed Acute complicated illness or Injury  Additional Data Reviewed and Analyzed Further history obtained from: EMS on arrival, Past medical history and medications listed in the EMR, Prior ED visit notes, and Prior labs/imaging results  Patient Encounter Risk Assessment Use of parenteral controlled substances and Consideration of hospitalization  Final Clinical Impression(s) / ED Diagnoses Final diagnoses:  Generalized abdominal pain    Rx / DC Orders ED Discharge Orders     None         Cristopher Peru, PA-C 04/15/23 0831    Alvira Monday, MD 04/16/23 [phone]

## 2023-04-15 NOTE — Discharge Instructions (Signed)
[facility] ______[facility] LAPAROSCOPIC SURGERY: POST OP INSTRUCTIONS Always review your discharge instruction sheet given to you by the facility where your surgery was performed. IF YOU HAVE DISABILITY OR FAMILY LEAVE FORMS, YOU MUST BRING THEM TO THE OFFICE FOR PROCESSING.   DO NOT GIVE THEM TO YOUR DOCTOR.  A prescription for pain medication may be given to you upon discharge.  Take your pain medication as prescribed, if needed.  If narcotic pain medicine is not needed, then you may take acetaminophen (Tylenol) or ibuprofen (Advil) as needed. Take your usually prescribed medications unless otherwise directed. If you need a refill on your pain medication, please contact your pharmacy.  They will contact our office to request authorization. Prescriptions will not be filled after 5pm or on week-ends. You should follow a light diet the first few days after arrival home, such as soup and crackers, etc.  Be sure to include lots of fluids daily. Most patients will experience some swelling and bruising in the area of the incisions.  Ice packs will help.  Swelling and bruising can take several days to resolve.  It is common to experience some constipation if taking pain medication after surgery.  Increasing fluid intake and taking a stool softener (such as Colace) will usually help or prevent this problem from occurring.  A mild laxative (Milk of Magnesia or Miralax) should be taken according to package instructions if there are no bowel movements after 48 hours. Unless discharge instructions indicate otherwise, you may remove your bandages 24-48 hours after surgery, and you may shower at that time.  You may have steri-strips (small skin tapes) in place directly over the incision.  These strips should be left on the skin for 7-10 days.  If your surgeon used skin glue on the incision, you may shower in 24 hours.  The glue will flake off over the next 2-3 weeks.  Any sutures or staples will be  removed at the office during your follow-up visit. ACTIVITIES:  You may resume regular (light) daily activities beginning the next day--such as daily self-care, walking, climbing stairs--gradually increasing activities as tolerated.  You may have sexual intercourse when it is comfortable.  Refrain from any heavy lifting or straining until approved by your doctor. You may drive when you are no longer taking prescription pain medication, you can comfortably wear a seatbelt, and you can safely maneuver your car and apply brakes. RETURN TO WORK:  __________________________________________________________ Bonita Quin should see your doctor in the office for a follow-up appointment approximately 2-3 weeks after your surgery.  Make sure that you call for this appointment within a day or two after you arrive home to insure a convenient appointment time. OTHER INSTRUCTIONS: [facility] LAPAROSCOPIC SURGERY: POST OP INSTRUCTIONS Always review your discharge instruction sheet given to you by the facility where your surgery was performed. IF YOU HAVE DISABILITY OR FAMILY LEAVE FORMS, YOU MUST BRING THEM TO THE OFFICE FOR PROCESSING.   DO NOT GIVE THEM TO YOUR DOCTOR.  A prescription for pain medication may be given to you upon discharge.  Take your pain medication as prescribed, if needed.  If narcotic pain medicine is not needed, then you may take acetaminophen (Tylenol) or ibuprofen (Advil) as needed. Take your usually prescribed medications unless otherwise directed. If you need a refill on your pain medication, please contact your pharmacy.  They will contact our office to request authorization. Prescriptions will not be filled after 5pm or on week-ends. You should  follow a light diet the first few days after arrival home, such as soup and crackers, etc.  Be sure to include lots of fluids daily. Most patients will experience some swelling and bruising in the area of the incisions.  Ice  packs will help.  Swelling and bruising can take several days to resolve.  It is common to experience some constipation if taking pain medication after surgery.  Increasing fluid intake and taking a stool softener (such as Colace) will usually help or prevent this problem from occurring.  A mild laxative (Milk of Magnesia or Miralax) should be taken according to package instructions if there are no bowel movements after 48 hours. Unless discharge instructions indicate otherwise, you may remove your bandages 24-48 hours after surgery, and you may shower at that time.  You may have steri-strips (small skin tapes) in place directly over the incision.  These strips should be left on the skin for 7-10 days.  If your surgeon used skin glue on the incision, you may shower in 24 hours.  The glue will flake off over the next 2-3 weeks.  Any sutures or staples will be removed at the office during your follow-up visit. ACTIVITIES:  You may resume regular (light) daily activities beginning the next day--such as daily self-care, walking, climbing stairs--gradually increasing activities as tolerated.  You may have sexual intercourse when it is comfortable.  Refrain from any heavy lifting or straining until approved by your doctor. You may drive when you are no longer taking prescription pain medication, you can comfortably wear a seatbelt, and you can safely maneuver your car and apply brakes. RETURN TO WORK:  __________________________________________________________ Bonita Quin should see your doctor in the office for a follow-up appointment approximately 2-3 weeks after your surgery.  Make sure that you call for this appointment within a day or two after you arrive home to insure a convenient appointment time. OTHER INSTRUCTIONS: YOU MAY SHOWER ICE PACK, TYLENOL ALSO FOR PAIN NO LIFTING MORE THAN 15 TO 20 POUNDS FOR 2 WEEKS IF YOU ARE HAVING ISSUES WHEN BACK IN [place], I WANT YOU TO CALL [facility] AND SEE DR.  BRAD TYLER.  CALL OUR OFFICE TO LET ME KNOW SO I CAN CONTACT HIM __________________________________________________________________________________________________________________________ __________________________________________________________________________________________________________________________ WHEN TO CALL YOUR DOCTOR: Fever over 101.0 Inability to urinate Continued bleeding from incision. Increased pain, redness, or drainage from the incision. Increasing abdominal pain  The clinic staff is available to answer your questions during regular business hours.  Please don't hesitate to call and ask to speak to one of the nurses for clinical concerns.  If you have a medical emergency, go to the nearest emergency room or call 911.  A surgeon from [facility] is always on call at the hospital. [address], [address], [place], [place]  [address] ? [address] [address], [place], [place]   [address] [phone] ? [phone] ? FAX [phone] Web site: [email or web address]__________________________________________________________________________________________________________________________ __________________________________________________________________________________________________________________________ WHEN TO CALL YOUR DOCTOR: Fever over 101.0 Inability to urinate Continued bleeding from incision. Increased pain, redness, or drainage from the incision. Increasing abdominal pain  The clinic staff is available to answer your questions during regular business hours.  Please don't hesitate to call and ask to speak to one of the nurses for clinical concerns.  If you have a medical emergency, go to the nearest emergency room or call 911.  A surgeon from [facility] is always on call at the hospital. [address], [place], [place]  [address] ? [address] [address], [place], [place]   [address] [phone] ? [phone] ? [phone] [phone]  [phone] Web site: [email or web address]

## 2023-04-15 NOTE — Hospital Course (Addendum)
Christopher Underwood is a 57 y.o. 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  Woke from sleep at 2AM with severe chest pain radiating to back.  No alleviating factors.  Thought he was having a heart attack, so came to the ER.  In the ER, ECG showed NSR, troponins normal.  CTA C/A/P showed no dissection or PE, but showed a dilated CBD, a small focus of air in the liver.  LFTs elevated, WBC 11.2K.    Started on Zosyn and GI and General Surgery consulted.

## 2023-04-15 NOTE — Assessment & Plan Note (Addendum)
-   Hold home Belbuca - Obtain MRCP - PRN analgesics for now - Consult Gen Surg and GI

## 2023-04-15 NOTE — H&P (Addendum)
History and Physical    Patient: Christopher Underwood:[id number] DOB: 1966-09-01 DOA: 04/15/2023 DOS: the patient was seen and examined on 04/15/2023 PCP: Christopher Mis, MD  Patient coming from: Home  Chief Complaint:  Chief Complaint  Patient presents with   Abdominal Pain   Chest Pain       HPI:  Christopher Underwood is a 57 y.o. 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  Woke from sleep at 2AM with severe chest pain radiating to back.  No alleviating factors.  Thought he was having a heart attack, so came to the ER.  In the ER, ECG showed NSR, troponins normal.  CTA C/A/P showed no dissection or PE, but showed a dilated CBD, a small focus of air in the liver.  LFTs elevated, WBC 11.2K.    Started on Zosyn and GI and General Surgery consulted.      Review of Systems  Constitutional:  Negative for chills, fever and malaise/fatigue.  Respiratory:  Negative for cough and shortness of breath.   Cardiovascular:  Positive for chest pain. Negative for leg swelling.  Gastrointestinal:  Positive for abdominal pain. Negative for constipation, diarrhea, nausea and vomiting.  Genitourinary:  Negative for dysuria, flank pain, frequency, hematuria and urgency.  Musculoskeletal:  Positive for back pain.  Psychiatric/Behavioral:  Negative for substance abuse.   All other systems reviewed and are negative.    Past Medical History:  Diagnosis Date   Arthritis    Attention deficit disorder    Depression    Hypertension    Past Surgical History:  Procedure Laterality Date   cervical fusion with plating x 2  2000   left knee arthroscopy  1996   x 2   lower back surgery  2011   LUMBAR LAMINECTOMY/DECOMPRESSION MICRODISCECTOMY Left 11/16/2017   Procedure: LUMBAR LAMINECTOMY/DECOMPRESSION MICRODISCECTOMY;  Surgeon: Christopher Lick, MD;  Location: [facility] OR;   Service: Orthopedics;  Laterality: Left;   right arm surgery for donor skin graft  2000   right foot surgery to clean out arthritis  2011   right heel reconstruction  2000   right heel surgery  to remove screw  2000   right heel surgery for fx  2010   right heel wound I and D for infection   2011   Social History:  reports that he quit smoking about 39 years ago. His smoking use included cigarettes. He has a 0.25 pack-year smoking history. He has never used smokeless tobacco. He reports current alcohol use. He reports that he does not use drugs.  Works for [employer].  Denies any recent alcohol, denies illicit drugs.    No Known Allergies  Family History  Problem Relation Age of Onset   Diabetes Other    Hypertension Other    Heart attack Other   Mother passed in sleep at age 73 from suspected heart attack. Brother passed from COVID in 2021.  Prior to Admission medications   Medication Sig Start Date End Date Taking? Authorizing Provider  ADDERALL XR 20 MG 24 hr capsule Take 20 mg by mouth every morning. 12/21/19   [provider]  BELBUCA 450 MCG FILM Place 450 mcg under the tongue 2 (two) times daily.  12/25/19   [provider]  folic acid (FOLVITE) 1 MG tablet Take 1 tablet (1 mg total) by mouth  daily. 09/18/20   Regalado, Jon Billings A, MD  hydrOXYzine (ATARAX) 25 MG tablet Take 1 tablet (25 mg total) by mouth at bedtime as needed. 04/14/22   Derwood Kaplan, MD  lisinopril (ZESTRIL) 10 MG tablet Take 10 mg by mouth daily. 06/03/20   [provider]  LORazepam (ATIVAN) 0.5 MG tablet Take 1 tablet BID for 2 days then 1 tablet daily for 2 days then stop. 09/17/20   Regalado, Belkys A, MD  metoprolol succinate (TOPROL-XL) 25 MG 24 hr tablet Take 25 mg by mouth daily.    [provider]  Multiple Vitamin (MULTIVITAMIN WITH MINERALS) TABS tablet Take 1 tablet by mouth daily.    [provider]  nicotine (NICODERM CQ - DOSED IN MG/24 HR) 7 mg/24hr patch Place  1 patch (7 mg total) onto the skin daily. 09/18/20   Regalado, Belkys A, MD  omeprazole (PRILOSEC) 20 MG capsule Take 20 mg by mouth daily. 11/26/19   [provider]  ondansetron (ZOFRAN ODT) 4 MG disintegrating tablet Take 1 tablet (4 mg total) by mouth every 8 (eight) hours as needed for nausea or vomiting. 07/24/19   Horton, Mayer Masker, MD  oxyCODONE-acetaminophen (PERCOCET) 10-325 MG tablet Take 1 tablet by mouth every 6 (six) hours as needed for pain.  12/25/19   [provider]  polyethylene glycol (MIRALAX / GLYCOLAX) 17 g packet Take 17 g by mouth daily. Patient not taking: Reported on 09/12/2020 01/14/20   Narda Bonds, MD  thiamine 100 MG tablet Take 1 tablet (100 mg total) by mouth daily. 09/17/20   Regalado, Belkys A, MD  VIIBRYD 20 MG TABS Take 20 mg by mouth daily.  11/22/19   [provider]  vitamin B-12 1000 MCG tablet Take 1 tablet (1,000 mcg total) by mouth daily. 09/18/20   Alba Cory, MD    Physical Exam: Vitals:   04/15/23 0630 04/15/23 0645 04/15/23 0715 04/15/23 0730  BP: 131/89 115/82 123/87 118/71  Pulse: 98 (!) 106 (!) 112 (!) 114  Resp: 20 16 11 11   Temp:      TempSrc:      SpO2: 99% 99% 100% 96%  Weight:      Height:        General appearance: Thin adult male, lying in bed, appears restless and uncomfortable, fidgety, eye contact, dress, and hygiene appropriate.  Responds appropriately to questions  HEENT: Anicteric, conjunctiva pink, lids and lashes normal without injection or icterus.  Visual tracking smooth, oropharynx moist without lesions, lips normal, normal auditory acuity.   Cor: Tachycardic, regular, no murmurs, no rubs, JVP normal, no lower extremity edema Resp: Normal respiratory rate and rhythm, lungs clear without rales or wheezes  Abd: He has tenderness to palpation throughout the abdomen, both sides, with a lot of guarding which makes exam difficult.  No dilation, no ascites.  MSK: Symmetrical without gross  deformities of the hands, large joints, or legs. Skin: Cap refill normal, skin intact without significant rashes or lesions Neuro: Speech is fluent, naming is grossly intact, patient's recall both remote and recent seen within normal limits.  Muscle tone normal, face symmetric, 5/5 strength in the upper and lower extremities bilaterally Psych: Appears anxious but attention span and concentration seem normal, he has a few tangential thoughts which are confusing to me but overall normal rate and rhythm of speech, thought process seems linear         Data Reviewed: Basic metabolic panel unremarkable LFTs show AST 121, ALT 50,  total bilirubin 3, macrocytosis Chest x-ray clear CT abdomen and pelvis with angiography shows 11 mm CBD ECG, personally reviewed, shows sinus tachycardia, no ST changes Lipase normal Lactate normal    Assessment and Plan: * Transaminitis LFTs 1 month ago with PCP normal.  Denies EtOH.  CT abdomen nonspecific.  Exam with diffuse symptoms, a lot of guarding, not really localizing to RUQ.  With dilated CBD, have to assume this is CBD stone.  He denies alcohol, but the pattern (macrocytosis, ratio of AST:ALT) is consistent.  - Obtain MRCP - Viral serologies - Continue empiric Zosyn until clinical picture is clearer - Trend LFTs - Consult Gen Surg, appreciate cares   Abdominal pain Discussed with Gen Surg, the amount of air on CT abdomen is very small.  His exam is limited.   Could this be Opiate withdrawal? - Hold home Belbuca - Oxycodone 10 q4 PRN or hydromorphone for pain - Obtain MRCP - PRN antiemetics for now - Consult Gen Surg and GI   Lung nodule Incidental finding. - Repeat outpatient CT in 6-12 months  Chronic pain See above.  Reports being out of oxycodone for a few days. - Hold home Belbuca  Essential hypertension - Hold lisinopril - Continue metoprolol             Advance Care Planning: FULL  Consults: GI, Dr. Gerald Stabs  Surg  Family Communication: None  Severity of Illness: The appropriate patient status for this patient is OBSERVATION. Observation status is judged to be reasonable and necessary in order to provide the required intensity of service to ensure the patient's safety. The patient's presenting symptoms, physical exam findings, and initial radiographic and laboratory data in the context of their medical condition is felt to place them at decreased risk for further clinical deterioration. Furthermore, it is anticipated that the patient will be medically stable for discharge from the hospital within 2 midnights of admission.   Author: Alberteen Sam, MD 04/15/2023 9:01 AM  For on call review [email or web address].

## 2023-04-15 NOTE — ED Notes (Signed)
Patient transported to CT with RN and tele monitor.

## 2023-04-15 NOTE — Anesthesia Preprocedure Evaluation (Signed)
Anesthesia Evaluation  Patient identified by MRN, date of birth, ID band Patient awake  General Assessment Comment:Christopher Underwood is a 57 y.o. M with chronic pain on Belbuca, hx DVT no longer on AC, hx alcohol withdrawal seizure and delirium in 2021, now abstinent from EtOH and HTN who presented with acute back and chest pain.   Woke from sleep at 2AM with severe chest pain radiating to back.  No alleviating factors.  Thought he was having a heart attack, so came to the ER.  In the ER, ECG showed NSR, troponins normal.  CTA C/A/P showed no dissection or PE, but showed a dilated CBD, a small focus of air in the liver.  LFTs elevated, WBC 11.2K.     Started on Zosyn and GI and General Surgery consulted.   Reviewed: Allergy & Precautions, H&P , NPO status , Patient's Chart, lab work & pertinent test results  Airway Mallampati: II  TM Distance: >3 FB Neck ROM: Full    Dental no notable dental hx.    Pulmonary neg pulmonary ROS, former smoker   Pulmonary exam normal breath sounds clear to auscultation       Cardiovascular hypertension, Pt. on medications and Pt. on home beta blockers Normal cardiovascular exam Rhythm:Regular Rate:Normal     Neuro/Psych Chronic pain patient  negative psych ROS   GI/Hepatic negative GI ROS, Neg liver ROS,,,  Endo/Other  negative endocrine ROS    Renal/GU negative Renal ROS  negative genitourinary   Musculoskeletal negative musculoskeletal ROS (+)    Abdominal   Peds negative pediatric ROS (+)  Hematology negative hematology ROS (+)   Anesthesia Other Findings   Reproductive/Obstetrics negative OB ROS                             Anesthesia Physical Anesthesia Plan  ASA: 3  Anesthesia Plan: General   Post-op Pain Management: Ketamine IV*   Induction: Intravenous and Rapid sequence  PONV Risk Score and Plan: 2 and Ondansetron, Dexamethasone and Treatment may  vary due to age or medical condition  Airway Management Planned: Oral ETT  Additional Equipment:   Intra-op Plan:   Post-operative Plan: Extubation in OR  Informed Consent: I have reviewed the patients History and Physical, chart, labs and discussed the procedure including the risks, benefits and alternatives for the proposed anesthesia with the patient or authorized representative who has indicated his/her understanding and acceptance.     Dental advisory given  Plan Discussed with: CRNA and Surgeon  Anesthesia Plan Comments:        Anesthesia Quick Evaluation

## 2023-04-15 NOTE — Anesthesia Postprocedure Evaluation (Signed)
Anesthesia Post Note  Patient: Christopher Underwood  Procedure(s) Performed: LAPAROSCOPIC CHOLECYSTECTOMY WITH INTRAOPERATIVE CHOLANGIOGRAM     Patient location during evaluation: PACU Anesthesia Type: General Level of consciousness: awake and alert Pain management: pain level controlled Vital Signs Assessment: post-procedure vital signs reviewed and stable Respiratory status: spontaneous breathing, nonlabored ventilation, respiratory function stable and patient connected to nasal cannula oxygen Cardiovascular status: blood pressure returned to baseline and stable Postop Assessment: no apparent nausea or vomiting Anesthetic complications: no  No notable events documented.  Last Vitals:  Vitals:   04/15/23 1745 04/15/23 1800  BP: 97/64   Pulse: 89 88  Resp: 16 (!) 23  Temp:    SpO2: 95% 97%    Last Pain:  Vitals:   04/15/23 1730  TempSrc:   PainSc: 7                  Darlin Stenseth S

## 2023-04-15 NOTE — Assessment & Plan Note (Addendum)
LFTs 1 month ago with PCP normal.  Denies EtOH.  CT abdomen nonspecific.  Exam with diffuse symptoms, a lot of guarding, not really localizing to RUQ.  WIth dilated CBD, have to assume this is CBD stone. - Obtain MRCP - Viral serologies - Trend LFTs - Consult Gen Surg, appreciate cares

## 2023-04-15 NOTE — Progress Notes (Signed)
Pharmacy Antibiotic Note  Christopher Underwood is a 57 y.o. male who presented to the ED on 04/15/2023 with complaint of epigastric abdominal pain radiating into his RUQ, chest and back.  Pain is associated with shortness of breath. Pain is worse with deep breaths and palpation.  CT scan showed cholelithiasis and mild CBD dilatation without definitive choledocholithiasis, small bubbles of gas over the left lobe liver without pneumatosis or bowel ischemia.     Marland Kitchen  Pharmacy has been consulted to dose zosyn for suspected intra-abdominal infection.  Today, 04/15/23: - Scr: 1.03 (CrCl: 81.7 mL/min) - Febrile (100.9) - WBC: 11.2  Plan: - Start Zosyn 3.375 gm q8h (infuse over 4 hours) - Monitor clinical status and renal function - Pt has good renal function, pharmacy will sign off for zosyn. Re-consult [place] if need further assistance.   Height: 5\' 10"  (177.8 cm) Weight: 74.8 kg (165 lb) IBW/kg (Calculated) : 73  Temp (24hrs), Avg:99.4 F (37.4 C), Min:97.8 F (36.6 C), Max:100.9 F (38.3 C)  Recent Labs  Lab 04/15/23 0628 04/15/23 0737  WBC 11.2*  --   CREATININE 1.03  --   LATICACIDVEN  --  0.9    Estimated Creatinine Clearance: 81.7 mL/min (by C-G formula based on SCr of 1.03 mg/dL).    No Known Allergies  Antimicrobials this admission: 6/21 Zosyn  >>   Thank you for allowing pharmacy to be a part of this patient's care.  Erasmo Leventhal, PharmD Candidate 04/15/2023 11:25 AM

## 2023-04-15 NOTE — Anesthesia Procedure Notes (Signed)
Procedure Name: Intubation Date/Time: 04/15/2023 3:36 PM  Performed by: Kizzie Fantasia, CRNAPre-anesthesia Checklist: Patient identified, Emergency Drugs available, Suction available, Patient being monitored and Timeout performed Patient Re-evaluated:Patient Re-evaluated prior to induction Oxygen Delivery Method: Circle system utilized Preoxygenation: Pre-oxygenation with 100% oxygen Induction Type: Rapid sequence and IV induction Laryngoscope Size: Mac and 4 Grade View: Grade I Tube type: Oral Tube size: 7.5 mm Number of attempts: 1 Airway Equipment and Method: Stylet Placement Confirmation: ETT inserted through vocal cords under direct vision, positive ETCO2 and breath sounds checked- equal and bilateral Secured at: 23 cm Tube secured with: Tape Dental Injury: Teeth and Oropharynx as per pre-operative assessment
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
## 2023-04-16 ENCOUNTER — Encounter (HOSPITAL_COMMUNITY): Payer: Self-pay | Admitting: Surgery

## 2023-04-16 DIAGNOSIS — K8012 Calculus of gallbladder with acute and chronic cholecystitis without obstruction: Secondary | ICD-10-CM | POA: Diagnosis not present

## 2023-04-16 DIAGNOSIS — R7401 Elevation of levels of liver transaminase levels: Secondary | ICD-10-CM

## 2023-04-16 LAB — CBC
HCT: 29.6 % — ABNORMAL LOW (ref 39.0–52.0)
Hemoglobin: 9.9 g/dL — ABNORMAL LOW (ref 13.0–17.0)
MCH: 34.9 pg — ABNORMAL HIGH (ref 26.0–34.0)
MCHC: 33.4 g/dL (ref 30.0–36.0)
MCV: 104.2 fL — ABNORMAL HIGH (ref 80.0–100.0)
Platelets: 114 10*3/uL — ABNORMAL LOW (ref 150–400)
RBC: 2.84 MIL/uL — ABNORMAL LOW (ref 4.22–5.81)
RDW: 12.7 % (ref 11.5–15.5)
WBC: 7.8 10*3/uL (ref 4.0–10.5)
nRBC: 0 % (ref 0.0–0.2)

## 2023-04-16 LAB — COMPREHENSIVE METABOLIC PANEL
ALT: 108 U/L — ABNORMAL HIGH (ref 0–44)
AST: 185 U/L — ABNORMAL HIGH (ref 15–41)
Albumin: 3 g/dL — ABNORMAL LOW (ref 3.5–5.0)
Alkaline Phosphatase: 40 U/L (ref 38–126)
Anion gap: 10 (ref 5–15)
BUN: 28 mg/dL — ABNORMAL HIGH (ref 6–20)
CO2: 18 mmol/L — ABNORMAL LOW (ref 22–32)
Calcium: 7.6 mg/dL — ABNORMAL LOW (ref 8.9–10.3)
Chloride: 102 mmol/L (ref 98–111)
Creatinine, Ser: 1.94 mg/dL — ABNORMAL HIGH (ref 0.61–1.24)
GFR, Estimated: 40 mL/min — ABNORMAL LOW (ref >60)
Glucose, Bld: 173 mg/dL — ABNORMAL HIGH (ref 70–99)
Potassium: 4.6 mmol/L (ref 3.5–5.1)
Sodium: 130 mmol/L — ABNORMAL LOW (ref 135–145)
Total Bilirubin: 4.5 mg/dL — ABNORMAL HIGH (ref 0.3–1.2)
Total Protein: 5.9 g/dL — ABNORMAL LOW (ref 6.5–8.1)

## 2023-04-16 NOTE — Progress Notes (Signed)
1 Day Post-Op   Subjective/Chief Complaint: Feels much better than yesterday Still with some RUQ pain Had BM    Objective: Vital signs in last 24 hours: Temp:  [97.6 F (36.4 C)-100.9 F (38.3 C)] 97.6 F (36.4 C) (06/22 0524) Pulse Rate:  [60-117] 60 (06/22 0524) Resp:  [11-23] 16 (06/22 0524) BP: (89-133)/(60-88) 90/63 (06/22 0524) SpO2:  [92 %-99 %] 97 % (06/22 0524) Last BM Date : 04/14/23  Intake/Output from previous day: 06/21 0701 - 06/22 0700 In: 1250 [I.V.:1000; IV Piggyback:250] Out: 26 [Stool:1; Blood:25] Intake/Output this shift: No intake/output data recorded.  Exam: Awake and alert Comfortable Abdomen soft, minimally tender, incisions clean  Lab Results:  Recent Labs    04/15/23 0628 04/16/23 0409  WBC 11.2* 7.8  HGB 12.5* 9.9*  HCT 38.2* 29.6*  PLT 202 114*   BMET Recent Labs    04/15/23 0628 04/16/23 0409  NA 135 130*  K 3.8 4.6  CL 100 102  CO2 24 18*  GLUCOSE 220* 173*  BUN 16 28*  CREATININE 1.03 1.94*  CALCIUM 9.1 7.6*   PT/INR No results for input(s): "LABPROT", "INR" in the last 72 hours. ABG No results for input(s): "PHART", "HCO3" in the last 72 hours.  Invalid input(s): "PCO2", "PO2"  Studies/Results: DG Cholangiogram Operative  Result Date: 04/15/2023 CLINICAL DATA:  [id number] Surgery, elective [id number] EXAM: INTRAOPERATIVE CHOLANGIOGRAM COMPARISON:  CT AP and MRCP, 04/15/2023. FLUOROSCOPY: Exposure Index (as provided by the fluoroscopic device): 15.9 mGy Kerma FINDINGS: Limited oblique planar images of the RIGHT upper quadrant obtained C-arm. Images demonstrating laparoscopic instrumentation, cystic duct cannulation and antegrade cholangiogram. Free spillage of contrast into the duodenum. No biliary ductal dilation. No evidence of biliary filling defect is demonstrated. IMPRESSION: Fluoroscopic imaging for intraoperative cholangiogram. No biliary ductal dilation or discrete filling defect is demonstrated. For complete  description of intra procedural findings, please see performing service dictation. Electronically Signed   By: Roanna Banning M.D.   On: 04/15/2023 16:41   MR ABDOMEN MRCP WO CONTRAST  Result Date: 04/15/2023 CLINICAL DATA:  Right upper quadrant abdominal pain, shortness of breath, pancreatitis suspected EXAM: MRI ABDOMEN WITHOUT CONTRAST  (INCLUDING MRCP) TECHNIQUE: Multiplanar multisequence MR imaging of the abdomen was performed. Heavily T2-weighted images of the biliary and pancreatic ducts were obtained, and three-dimensional MRCP images were rendered by post processing. COMPARISON:  CT chest abdomen pelvis angiogram, 04/15/2023 FINDINGS: Lower chest: No acute abnormality. Hepatobiliary: No solid liver abnormality is seen. Mildly distended gallbladder containing numerous tiny gallstones near the tortuous gallbladder neck. Common bile duct is at the upper limit of normal in caliber measuring 0.7 cm, without intrahepatic biliary ductal dilatation. Small foci of possible pneumobilia identified by prior CT are not appreciated by MR. Pancreas: Unremarkable. No pancreatic ductal dilatation or surrounding inflammatory changes. Spleen: Normal in size without significant abnormality. Adrenals/Urinary Tract: Adrenal glands are unremarkable. Kidneys are normal, without renal calculi, solid lesion, or hydronephrosis. Stomach/Bowel: Stomach is within normal limits. No evidence of bowel wall thickening, distention, or inflammatory changes. Vascular/Lymphatic: No significant vascular findings are present. No enlarged abdominal lymph nodes. Other: No abdominal wall hernia or abnormality. No ascites. Musculoskeletal: No acute or significant osseous findings. IMPRESSION: 1. Mildly distended gallbladder containing numerous tiny gallstones near the tortuous gallbladder neck. No gallbladder wall thickening or pericholecystic fluid. 2. Common bile duct is at the upper limit of normal in caliber measuring 0.7 cm, without  intrahepatic biliary ductal dilatation nor evidence choledocholithiasis. Small foci of possible pneumobilia identified by prior  CT are not appreciated by MR. 3. No evidence of pancreatitis nor other acute inflammatory findings in the abdomen. Electronically Signed   By: Jearld Lesch M.D.   On: 04/15/2023 13:33   MR 3D Recon At Scanner  Result Date: 04/15/2023 CLINICAL DATA:  Right upper quadrant abdominal pain, shortness of breath, pancreatitis suspected EXAM: MRI ABDOMEN WITHOUT CONTRAST  (INCLUDING MRCP) TECHNIQUE: Multiplanar multisequence MR imaging of the abdomen was performed. Heavily T2-weighted images of the biliary and pancreatic ducts were obtained, and three-dimensional MRCP images were rendered by post processing. COMPARISON:  CT chest abdomen pelvis angiogram, 04/15/2023 FINDINGS: Lower chest: No acute abnormality. Hepatobiliary: No solid liver abnormality is seen. Mildly distended gallbladder containing numerous tiny gallstones near the tortuous gallbladder neck. Common bile duct is at the upper limit of normal in caliber measuring 0.7 cm, without intrahepatic biliary ductal dilatation. Small foci of possible pneumobilia identified by prior CT are not appreciated by MR. Pancreas: Unremarkable. No pancreatic ductal dilatation or surrounding inflammatory changes. Spleen: Normal in size without significant abnormality. Adrenals/Urinary Tract: Adrenal glands are unremarkable. Kidneys are normal, without renal calculi, solid lesion, or hydronephrosis. Stomach/Bowel: Stomach is within normal limits. No evidence of bowel wall thickening, distention, or inflammatory changes. Vascular/Lymphatic: No significant vascular findings are present. No enlarged abdominal lymph nodes. Other: No abdominal wall hernia or abnormality. No ascites. Musculoskeletal: No acute or significant osseous findings. IMPRESSION: 1. Mildly distended gallbladder containing numerous tiny gallstones near the tortuous gallbladder neck.  No gallbladder wall thickening or pericholecystic fluid. 2. Common bile duct is at the upper limit of normal in caliber measuring 0.7 cm, without intrahepatic biliary ductal dilatation nor evidence choledocholithiasis. Small foci of possible pneumobilia identified by prior CT are not appreciated by MR. 3. No evidence of pancreatitis nor other acute inflammatory findings in the abdomen. Electronically Signed   By: Jearld Lesch M.D.   On: 04/15/2023 13:33   CT Angio Chest/Abd/Pel for Dissection W and/or W/WO  Result Date: 04/15/2023 CLINICAL DATA:  Thoracoabdominal aortic dissection post open repair. Abdominal pain EXAM: CT ANGIOGRAPHY CHEST, ABDOMEN AND PELVIS TECHNIQUE: Chest CTA 01/11/2020 Multidetector CT imaging through the chest, abdomen and pelvis was performed using the standard protocol during bolus administration of intravenous contrast. Multiplanar reconstructed images and MIPs were obtained and reviewed to evaluate the vascular anatomy. RADIATION DOSE REDUCTION: This exam was performed according to the departmental dose-optimization program which includes automated exposure control, adjustment of the mA and/or kV according to patient size and/or use of iterative reconstruction technique. CONTRAST:  OMNIPAQUE IOHEXOL 350 MG/ML SOLN COMPARISON:  None Available. FINDINGS: CTA CHEST FINDINGS Cardiovascular: Preferential opacification of the thoracic aorta. No evidence of thoracic aortic aneurysm or dissection. Normal heart size. No pericardial effusion. Atheromatous calcification of the aorta and coronaries. Mediastinum/Nodes: Negative for mass or adenopathy Lungs/Pleura: There is no edema, consolidation, effusion, or pneumothorax. Mild subpleural scarring in the right upper lobe laterally since prior. At the inferior aspect there is a 8 x 3 mm nodular component. 4 mm nodule in the left lower lobe on 6:101. Musculoskeletal: No acute finding Review of the MIP images confirms the above findings. CTA  ABDOMEN AND PELVIS FINDINGS VASCULAR Aorta: Diffuse atheromatous wall thickening. No dissection or aneurysm Celiac: Patent without evidence of aneurysm, dissection, vasculitis or significant stenosis. SMA: Patent without evidence of aneurysm, dissection, vasculitis or significant stenosis. Renals: Accessory right renal artery. The renal arteries are smoothly contoured and widely patent IMA: Patent Inflow: Atheromatous plaque without aneurysm, dissection, or significant stenosis.  Veins: Unremarkable Review of the MIP images confirms the above findings. NON-VASCULAR Hepatobiliary: Small focus of gas in the peripheral left upper lobe liver with branching shape.Full gallbladder with calculi. Mild CBD dilatation (11 mm) without calcified stone. Pancreas: Unremarkable. Spleen: Unremarkable. Adrenals/Urinary Tract: Negative adrenals. No hydronephrosis or stone. Unremarkable bladder. Stomach/Bowel: Full stomach containing fluid. No visible bowel inflammation or obstructive process. Vascular/Lymphatic: No acute vascular abnormality. No mass or adenopathy. Reproductive:Retracted right testicle into the inguinal canal. Other: No ascites or pneumoperitoneum. Chronic T12 and L4 superior endplate fractures. Musculoskeletal: No acute abnormalities. Generalized lumbar spine degeneration. Review of the MIP images confirms the above findings. IMPRESSION: 1. No evidence of acute aortic syndrome. 2. Full gallbladder with gallstones and mild CBD dilatation. No calcified choledocholithiasis, correlate with liver function tests. 3. Small bubbles of gas over the left lobe liver, indeterminate between biliary and venous location. No pneumatosis or bowel ischemia noted, consider correlation with lactate and history of sphincterotomy. 4. Fluid distended stomach. 5. Scarring in the right upper lobe with inferior nodular component with borderline nodular appearance inferiorly measuring 8 x 3 mm. Non-contrast chest CT at 6-12 months is  recommended. If the nodule is stable at time of repeat CT, then future CT at 18-24 months (from today's scan) is considered optional for low-risk patients, but is recommended for high-risk patients. This recommendation follows the consensus statement: Guidelines for Management of Incidental Pulmonary Nodules Detected on CT Images: From the Fleischner Society 2017; Radiology 2017; 284:228-243. 6. Atherosclerosis including the coronary arteries. Electronically Signed   By: Tiburcio Pea M.D.   On: 04/15/2023 07:31   DG Chest Port 1 View  Result Date: 04/15/2023 CLINICAL DATA:  Chest pain EXAM: PORTABLE CHEST 1 VIEW COMPARISON:  09/12/2020 FINDINGS: The lungs are clear without focal pneumonia, edema, pneumothorax or pleural effusion. Streaky minimal atelectasis noted at the left base. The cardiopericardial silhouette is within normal limits for size. No acute bony abnormality. Telemetry leads overlie the chest. IMPRESSION: No active disease. Electronically Signed   By: Kennith Center M.D.   On: 04/15/2023 06:52    Anti-infectives: Anti-infectives (From admission, onward)    Start     Dose/Rate Route Frequency Ordered Stop   04/15/23 1400  piperacillin-tazobactam (ZOSYN) 3.375 g in sodium chloride 0.9 % 50 mL IVPB  Status:  Discontinued        3.375 g 12.5 mL/hr over 240 Minutes Intravenous Every 8 hours 04/15/23 1151 04/15/23 1152   04/15/23 1400  piperacillin-tazobactam (ZOSYN) IVPB 3.375 g        3.375 g 12.5 mL/hr over 240 Minutes Intravenous Every 8 hours 04/15/23 1152     04/15/23 0745  piperacillin-tazobactam (ZOSYN) IVPB 3.375 g        3.375 g 100 mL/hr over 30 Minutes Intravenous  Once 04/15/23 0739 04/15/23 0830       Assessment/Plan: Cholecystitis with cholelithiasis, suspected cholangitis S/p lap chole and IOC 6/21  LFT"S up this morning as well as creatinine.  Clinically, he feels much better.   I suspect he did have cholangitis but passed the stone in the CBD.  The official  read on the cholangiogram was no distal CBD stone.  Hgb back to baseline with rehydration.  Will allow regular diet Continue IV fluids Repeat labs    Abigail Miyamoto 04/16/2023

## 2023-04-16 NOTE — Progress Notes (Signed)
[facility] Gastroenterology Progress Note  Christopher Underwood 57 y.o. 10/06/1966   Subjective: Resting comfortably. Denies abdominal pain. Tolerated solid food. Wife in room.  Objective: Vital signs: Vitals:   04/16/23 0524 04/16/23 1256  BP: 90/63 (!) 91/54  Pulse: 60 75  Resp: 16 16  Temp: 97.6 F (36.4 C) 98 F (36.7 C)  SpO2: 97% 98%    Physical Exam: Gen: lethargic, well-nourished, no acute distress  HEENT: +icteric sclera CV: RRR Chest: CTA B Abd: upper quadrant tenderness with guarding, soft, nondistended, +BS Ext: no edema  Lab Results: Recent Labs    04/15/23 0628 04/16/23 0409  NA 135 130*  K 3.8 4.6  CL 100 102  CO2 24 18*  GLUCOSE 220* 173*  BUN 16 28*  CREATININE 1.03 1.94*  CALCIUM 9.1 7.6*   Recent Labs    04/15/23 0628 04/15/23 1056 04/16/23 0409  AST 121*  --  185*  ALT 50*  --  108*  ALKPHOS 80  --  40  BILITOT 3.0* 4.1* 4.5*  PROT 7.7  --  5.9*  ALBUMIN 4.0  --  3.0*   Recent Labs    04/15/23 0628 04/16/23 0409  WBC 11.2* 7.8  NEUTROABS 10.0*  --   HGB 12.5* 9.9*  HCT 38.2* 29.6*  MCV 106.4* 104.2*  PLT 202 114*      Assessment/Plan: Acute cholecystitis - s/p lap chole yesterday. Question of cholangitis. Mild increase in LFTs today. IOC negative for CBD stone. Tolerating solid food. Dispo per surgery but hopefully LFTs improve tomorrow and then would recommend d/c home.   Shirley Friar 04/16/2023, 12:59 PM  Questions please call [phone] ID: Christopher Underwood, male   DOB: 1966/08/11, 57 y.o.   MRN: [id number]

## 2023-04-16 NOTE — Plan of Care (Signed)
  Problem: Clinical Measurements: Goal: Ability to maintain clinical measurements within normal limits will improve Outcome: Progressing Goal: Will remain free from infection Outcome: Progressing   Problem: Pain Managment: Goal: General experience of comfort will improve Outcome: Not Progressing   Problem: Education: Goal: Knowledge of General Education information will improve Description: Including pain rating scale, medication(s)/side effects and non-pharmacologic comfort measures Outcome: Adequate for Discharge   Problem: Health Behavior/Discharge Planning: Goal: Ability to manage health-related needs will improve Outcome: Adequate for Discharge

## 2023-04-16 NOTE — Progress Notes (Signed)
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  He was found to have acute cholecystitis without choledocholithiasis, and he was taken to the OR on 6/21 and is status post laparoscopic cholecystectomy.  Subjective / 24h Interval events: Reports soreness at the surgical site, no nausea or vomiting.  Assesement and Plan: Principal Problem:   Transaminitis Active Problems:   Abdominal pain   Essential hypertension   Chronic pain   Lung nodule  Principal problem Sepsis due to acute cholecystitis - status post laparoscopic cholecystectomy on 6/21.  Has been placed on antibiotics as he was febrile on admission, continue. -Following the surgery he defervesced, white count has normalized and clinically looks better. -Advance diet today  Active problems Acute kidney injury -likely in the setting of #1, he was also intermittently hypotensive.  Creatinine jumped to 1.9 today from 1.0 yesterday.  Continue IV fluids, encouraged p.o. intake  Elevated LFTs -slightly worse today, but likely reactive following the surgery due to #1.  Continue to monitor  Hyponatremia-monitor  Macrocytic anemia -hemoglobin slightly lower following the surgery, there is a dilutional component given IV fluids  Thrombocytopenia-in the setting of acute illness  Scheduled Meds:  enoxaparin (LOVENOX) injection  40 mg Subcutaneous Daily   pantoprazole  40 mg Oral Daily   Continuous Infusions:  lactated ringers 150 mL/hr at 04/16/23 9562   piperacillin-tazobactam (ZOSYN)  IV 3.375 g (04/16/23 0625)   PRN Meds:.acetaminophen, docusate sodium, HYDROmorphone (DILAUDID) injection, ondansetron **OR** ondansetron  (ZOFRAN) IV, oxyCODONE, polyethylene glycol  Current Outpatient Medications  Medication Instructions   ADDERALL XR 20 MG 24 hr capsule 20 mg, Oral, Every morning   Belbuca 450 mcg, Sublingual, 2 times daily   cyanocobalamin 1,000 mcg, Oral, Daily   gabapentin (NEURONTIN) 100 mg, Oral, 4 times daily   hydrOXYzine (ATARAX) 25 mg, Oral, At bedtime PRN   lisinopril (ZESTRIL) 10 mg, Oral, Daily   metoprolol succinate (TOPROL-XL) 25 mg, Oral, Daily   Multiple Vitamin (MULTIVITAMIN WITH MINERALS) TABS tablet 1 tablet, Oral, Daily   omeprazole (PRILOSEC) 20 mg, Oral, Daily   ondansetron (ZOFRAN ODT) 4 mg, Oral, Every 8 hours PRN   oxyCODONE-acetaminophen (PERCOCET) 10-325 MG tablet 1 tablet, Oral, Every 6 hours PRN   sildenafil (VIAGRA) 50 mg, Oral, As needed   thiamine (VITAMIN B1) 100 mg, Oral, Daily   tiZANidine (ZANAFLEX) 4 MG tablet 1 tablet, Oral, 3 times daily PRN    Diet Orders (From admission, onward)     Start     Ordered   04/16/23 0743  Diet regular Room service appropriate? Yes; Fluid consistency: Thin  Diet effective now       Question Answer Comment  Room service appropriate? Yes   Fluid consistency: Thin      04/16/23 0743            DVT prophylaxis: enoxaparin (LOVENOX) injection 40 mg Start: 04/15/23 1015   Lab Results  Component Value Date   PLT 114 (L) 04/16/2023      Code Status: Full Code  Family Communication: No family at bedside  Status is: Observation The patient will require care spanning > 2 midnights and should be moved to inpatient because: Acute kidney injury  Level of care: Med-Surg  Consultants:  General surgery   Objective: Vitals:   04/15/23 1844 04/15/23 2220 04/16/23 0241 04/16/23 0524  BP: 100/65 104/87 114/65 90/63  Pulse: 79 77 82 60  Resp: 16 18 18 16   Temp: 98.6 F (37 C) 97.6 F (36.4 C) 97.6 F (36.4 C) 97.6 F (36.4 C)  TempSrc: Oral     SpO2: 97% 96% 95% 97%  Weight:      Height:        Intake/Output  Summary (Last 24 hours) at 04/16/2023 1108 Last data filed at 04/16/2023 0900 Gross per 24 hour  Intake 1470 ml  Output 26 ml  Net 1444 ml   Wt Readings from Last 3 Encounters:  04/15/23 74.8 kg  04/14/22 77.1 kg  09/14/20 76.6 kg    Examination:  Constitutional: NAD Eyes: no scleral icterus ENMT: Mucous membranes are moist.  Neck: normal, supple Respiratory: clear to auscultation bilaterally, no wheezing, no crackles. Normal respiratory effort. No accessory muscle use.  Cardiovascular: Regular rate and rhythm, no murmurs / rubs / gallops. No LE edema.  Abdomen: non distended, no tenderness. Bowel sounds positive.  Musculoskeletal: no clubbing / cyanosis.   Data Reviewed: I have independently reviewed following labs and imaging studies   CBC Recent Labs  Lab 04/15/23 0628 04/16/23 0409  WBC 11.2* 7.8  HGB 12.5* 9.9*  HCT 38.2* 29.6*  PLT 202 114*  MCV 106.4* 104.2*  MCH 34.8* 34.9*  MCHC 32.7 33.4  RDW 12.6 12.7  LYMPHSABS 0.6*  --   MONOABS 0.2  --   EOSABS 0.2  --   BASOSABS 0.0  --     Recent Labs  Lab 04/15/23 0628 04/15/23 0737 04/15/23 1056 04/16/23 0409  NA 135  --   --  130*  K 3.8  --   --  4.6  CL 100  --   --  102  CO2 24  --   --  18*  GLUCOSE 220*  --   --  173*  BUN 16  --   --  28*  CREATININE 1.03  --   --  1.94*  CALCIUM 9.1  --   --  7.6*  AST 121*  --   --  185*  ALT 50*  --   --  108*  ALKPHOS 80  --   --  40  BILITOT 3.0*  --  4.1* 4.5*  ALBUMIN 4.0  --   --  3.0*  LATICACIDVEN  --  0.9 1.6  --     ------------------------------------------------------------------------------------------------------------------ No results for input(s): "CHOL", "HDL", "LDLCALC", "TRIG", "CHOLHDL", "LDLDIRECT" in the last 72 hours.  No results found for: "HGBA1C" ------------------------------------------------------------------------------------------------------------------ No results for input(s): "TSH", "T4TOTAL", "T3FREE", "THYROIDAB" in  the last 72 hours.  Invalid input(s): "FREET3"  Cardiac Enzymes No results for input(s): "CKMB", "TROPONINI", "MYOGLOBIN" in the last 168 hours.  Invalid input(s): "CK" ------------------------------------------------------------------------------------------------------------------ No results found for: "BNP"  CBG: No results for input(s): "GLUCAP" in the last 168 hours.  No results found for this or any previous visit (from the past 240 hour(s)).   Radiology Studies: DG Cholangiogram Operative  Result Date: 04/15/2023 CLINICAL DATA:  [id number] Surgery, elective [id number] EXAM: INTRAOPERATIVE CHOLANGIOGRAM COMPARISON:  CT AP and MRCP, 04/15/2023. FLUOROSCOPY: Exposure Index (as provided by the fluoroscopic device): 15.9 mGy Kerma FINDINGS: Limited oblique planar images of the RIGHT upper quadrant obtained C-arm. Images demonstrating laparoscopic instrumentation, cystic duct cannulation and antegrade cholangiogram. Free spillage of contrast into the  duodenum. No biliary ductal dilation. No evidence of biliary filling defect is demonstrated. IMPRESSION: Fluoroscopic imaging for intraoperative cholangiogram. No biliary ductal dilation or discrete filling defect is demonstrated. For complete description of intra procedural findings, please see performing service dictation. Electronically Signed   By: Roanna Banning M.D.   On: 04/15/2023 16:41   MR ABDOMEN MRCP WO CONTRAST  Result Date: 04/15/2023 CLINICAL DATA:  Right upper quadrant abdominal pain, shortness of breath, pancreatitis suspected EXAM: MRI ABDOMEN WITHOUT CONTRAST  (INCLUDING MRCP) TECHNIQUE: Multiplanar multisequence MR imaging of the abdomen was performed. Heavily T2-weighted images of the biliary and pancreatic ducts were obtained, and three-dimensional MRCP images were rendered by post processing. COMPARISON:  CT chest abdomen pelvis angiogram, 04/15/2023 FINDINGS: Lower chest: No acute abnormality. Hepatobiliary: No solid liver  abnormality is seen. Mildly distended gallbladder containing numerous tiny gallstones near the tortuous gallbladder neck. Common bile duct is at the upper limit of normal in caliber measuring 0.7 cm, without intrahepatic biliary ductal dilatation. Small foci of possible pneumobilia identified by prior CT are not appreciated by MR. Pancreas: Unremarkable. No pancreatic ductal dilatation or surrounding inflammatory changes. Spleen: Normal in size without significant abnormality. Adrenals/Urinary Tract: Adrenal glands are unremarkable. Kidneys are normal, without renal calculi, solid lesion, or hydronephrosis. Stomach/Bowel: Stomach is within normal limits. No evidence of bowel wall thickening, distention, or inflammatory changes. Vascular/Lymphatic: No significant vascular findings are present. No enlarged abdominal lymph nodes. Other: No abdominal wall hernia or abnormality. No ascites. Musculoskeletal: No acute or significant osseous findings. IMPRESSION: 1. Mildly distended gallbladder containing numerous tiny gallstones near the tortuous gallbladder neck. No gallbladder wall thickening or pericholecystic fluid. 2. Common bile duct is at the upper limit of normal in caliber measuring 0.7 cm, without intrahepatic biliary ductal dilatation nor evidence choledocholithiasis. Small foci of possible pneumobilia identified by prior CT are not appreciated by MR. 3. No evidence of pancreatitis nor other acute inflammatory findings in the abdomen. Electronically Signed   By: Jearld Lesch M.D.   On: 04/15/2023 13:33   MR 3D Recon At Scanner  Result Date: 04/15/2023 CLINICAL DATA:  Right upper quadrant abdominal pain, shortness of breath, pancreatitis suspected EXAM: MRI ABDOMEN WITHOUT CONTRAST  (INCLUDING MRCP) TECHNIQUE: Multiplanar multisequence MR imaging of the abdomen was performed. Heavily T2-weighted images of the biliary and pancreatic ducts were obtained, and three-dimensional MRCP images were rendered by post  processing. COMPARISON:  CT chest abdomen pelvis angiogram, 04/15/2023 FINDINGS: Lower chest: No acute abnormality. Hepatobiliary: No solid liver abnormality is seen. Mildly distended gallbladder containing numerous tiny gallstones near the tortuous gallbladder neck. Common bile duct is at the upper limit of normal in caliber measuring 0.7 cm, without intrahepatic biliary ductal dilatation. Small foci of possible pneumobilia identified by prior CT are not appreciated by MR. Pancreas: Unremarkable. No pancreatic ductal dilatation or surrounding inflammatory changes. Spleen: Normal in size without significant abnormality. Adrenals/Urinary Tract: Adrenal glands are unremarkable. Kidneys are normal, without renal calculi, solid lesion, or hydronephrosis. Stomach/Bowel: Stomach is within normal limits. No evidence of bowel wall thickening, distention, or inflammatory changes. Vascular/Lymphatic: No significant vascular findings are present. No enlarged abdominal lymph nodes. Other: No abdominal wall hernia or abnormality. No ascites. Musculoskeletal: No acute or significant osseous findings. IMPRESSION: 1. Mildly distended gallbladder containing numerous tiny gallstones near the tortuous gallbladder neck. No gallbladder wall thickening or pericholecystic fluid. 2. Common bile duct is at the upper limit of normal in caliber measuring 0.7 cm, without intrahepatic biliary ductal dilatation nor evidence choledocholithiasis.  Small foci of possible pneumobilia identified by prior CT are not appreciated by MR. 3. No evidence of pancreatitis nor other acute inflammatory findings in the abdomen. Electronically Signed   By: Jearld Lesch M.D.   On: 04/15/2023 13:33     Pamella Pert, MD, PhD [facility]  Between 7 am - 7 pm I am available, please contact me via Amion (for emergencies) or Securechat (non urgent messages)  Between 7 pm - 7 am I am not available, please contact night coverage MD/APP via Amion

## 2023-04-17 DIAGNOSIS — R7401 Elevation of levels of liver transaminase levels: Secondary | ICD-10-CM | POA: Diagnosis not present

## 2023-04-17 DIAGNOSIS — K8012 Calculus of gallbladder with acute and chronic cholecystitis without obstruction: Secondary | ICD-10-CM | POA: Diagnosis not present

## 2023-04-17 LAB — COMPREHENSIVE METABOLIC PANEL
ALT: 83 U/L — ABNORMAL HIGH (ref 0–44)
AST: 105 U/L — ABNORMAL HIGH (ref 15–41)
Albumin: 2.8 g/dL — ABNORMAL LOW (ref 3.5–5.0)
Alkaline Phosphatase: 37 U/L — ABNORMAL LOW (ref 38–126)
Anion gap: 10 (ref 5–15)
BUN: 27 mg/dL — ABNORMAL HIGH (ref 6–20)
CO2: 19 mmol/L — ABNORMAL LOW (ref 22–32)
Calcium: 7.3 mg/dL — ABNORMAL LOW (ref 8.9–10.3)
Chloride: 106 mmol/L (ref 98–111)
Creatinine, Ser: 1.54 mg/dL — ABNORMAL HIGH (ref 0.61–1.24)
GFR, Estimated: 52 mL/min — ABNORMAL LOW (ref >60)
Glucose, Bld: 143 mg/dL — ABNORMAL HIGH (ref 70–99)
Potassium: 3.5 mmol/L (ref 3.5–5.1)
Sodium: 135 mmol/L (ref 135–145)
Total Bilirubin: 2 mg/dL — ABNORMAL HIGH (ref 0.3–1.2)
Total Protein: 5.8 g/dL — ABNORMAL LOW (ref 6.5–8.1)

## 2023-04-17 LAB — CBC
HCT: 28.9 % — ABNORMAL LOW (ref 39.0–52.0)
Hemoglobin: 9.7 g/dL — ABNORMAL LOW (ref 13.0–17.0)
MCH: 35.8 pg — ABNORMAL HIGH (ref 26.0–34.0)
MCHC: 33.6 g/dL (ref 30.0–36.0)
MCV: 106.6 fL — ABNORMAL HIGH (ref 80.0–100.0)
Platelets: 114 10*3/uL — ABNORMAL LOW (ref 150–400)
RBC: 2.71 MIL/uL — ABNORMAL LOW (ref 4.22–5.81)
RDW: 13.1 % (ref 11.5–15.5)
WBC: 7.8 10*3/uL (ref 4.0–10.5)
nRBC: 0 % (ref 0.0–0.2)

## 2023-04-17 LAB — MAGNESIUM: Magnesium: 1.3 mg/dL — ABNORMAL LOW (ref 1.7–2.4)

## 2023-04-17 MED ORDER — OXYCODONE HCL 5 MG PO TABS: 5.0000 mg | ORAL_TABLET | Freq: Four times a day (QID) | ORAL | 0 refills | Status: AC | PRN

## 2023-04-17 MED ORDER — AMOXICILLIN-POT CLAVULANATE 875-125 MG PO TABS: 1.0000 | ORAL_TABLET | Freq: Two times a day (BID) | ORAL | 0 refills | Status: AC

## 2023-04-17 MED ORDER — MAGNESIUM SULFATE 2 GM/50ML IV SOLN
2.0000 g | Freq: Once | INTRAVENOUS | Status: AC
  Administered 2023-04-17: 2 g via INTRAVENOUS
  Filled 2023-04-17: qty 50

## 2023-04-17 NOTE — Discharge Summary (Signed)
Physician Discharge Summary  Christopher CHRISTIANA Underwood:[id number] DOB: [birth year] DOA: 04/15/2023  PCP: Macy Mis, MD  Admit date: 04/15/2023 Discharge date: 04/17/2023  Admitted From: home Disposition:  home  Recommendations for Outpatient Follow-up:  Follow up with PCP in 4-5 days Please obtain CMP/CBC in 4-5 days  Home Health: none Equipment/Devices: none  Discharge Condition: stable CODE STATUS: Full code Diet Orders (From admission, onward)     Start     Ordered   04/16/23 0743  Diet regular Room service appropriate? Yes; Fluid consistency: Thin  Diet effective now       Question Answer Comment  Room service appropriate? Yes   Fluid consistency: Thin      04/16/23 0743            HPI: Per admitting MD, Mr. Leicht is a 57 y.o. 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 Woke from sleep at 2AM with severe chest pain radiating to back.  No alleviating factors.  Thought he was having a heart attack, so came to the ER. In the ER, ECG showed NSR, troponins normal.  CTA C/A/P showed no dissection or PE, but showed a dilated CBD, a small focus of air in the liver.  LFTs elevated, WBC 11.2K. Started on Zosyn and GI and General Surgery consulted.  Hospital Course / Discharge diagnoses: Principal Problem:   Transaminitis Active Problems:   Abdominal pain   Essential hypertension   Chronic pain   Lung nodule   Principal problem Sepsis due to acute cholecystitis -patient was admitted to the hospital with fever, leukocytosis in the setting of acute cholecystitis.  General surgery and gastroenterology were consulted.  He eventually was taken to the OR for laparoscopic cholecystectomy on 6/21.  He did well following the surgery, pain has now improved, able to tolerate a regular diet, good p.o. intake and he will be discharged home in stable  condition with outpatient follow-up  Active problems Acute kidney injury -likely in the setting of #1, he was also intermittently hypotensive.  He also mentions that the day prior to coming to the hospital he realized he was urinating a whole lot less than usual.  Creatinine jumped to 1.9 postop from 1.0 on admission.  With fluids, good p.o. intake, his creatinine is now improving.  His lisinopril will be held on discharge.  He was advised to follow-up with his PCP in 3 to 4 days and have repeat blood work Elevated LFTs -improved pain postoperatively Hypomagnesemia-replaced prior to discharge Hyponatremia-monitor Macrocytic anemia -hemoglobin slightly lower following the surgery, there is a dilutional component given IV fluids Thrombocytopenia-in the setting of acute illness, platelets are stable and anticipate recovery   Discharge Instructions   Allergies as of 04/17/2023   No Known Allergies      Medication List     STOP taking these medications    lisinopril 10 MG tablet Commonly known as: ZESTRIL       TAKE these medications    Adderall XR 20 MG 24 hr capsule Generic drug: amphetamine-dextroamphetamine Take 20 mg by mouth every morning.   amoxicillin-clavulanate 875-125 MG tablet Commonly known as: AUGMENTIN Take 1 tablet by mouth 2 (two) times daily for 5 days.   Belbuca 450 MCG Film Generic drug: Buprenorphine HCl Place 450 mcg under the tongue 2 (two) times daily.   cyanocobalamin  1000 MCG tablet Take 1 tablet (1,000 mcg total) by mouth daily.   gabapentin 100 MG capsule Commonly known as: NEURONTIN Take 100 mg by mouth 4 (four) times daily.   hydrOXYzine 25 MG tablet Commonly known as: ATARAX Take 1 tablet (25 mg total) by mouth at bedtime as needed.   metoprolol succinate 25 MG 24 hr tablet Commonly known as: TOPROL-XL Take 25 mg by mouth daily.   multivitamin with minerals Tabs tablet Take 1 tablet by mouth daily.   omeprazole 20 MG  capsule Commonly known as: PRILOSEC Take 20 mg by mouth daily.   ondansetron 4 MG disintegrating tablet Commonly known as: Zofran ODT Take 1 tablet (4 mg total) by mouth every 8 (eight) hours as needed for nausea or vomiting.   oxyCODONE 5 MG immediate release tablet Commonly known as: Oxy IR/ROXICODONE Take 1-2 tablets (5-10 mg total) by mouth every 6 (six) hours as needed for moderate pain, severe pain or breakthrough pain.   oxyCODONE-acetaminophen 10-325 MG tablet Commonly known as: PERCOCET Take 1 tablet by mouth every 6 (six) hours as needed for pain.   sildenafil 50 MG tablet Commonly known as: VIAGRA Take 50 mg by mouth as needed for erectile dysfunction.   thiamine 100 MG tablet Commonly known as: VITAMIN B1 Take 1 tablet (100 mg total) by mouth daily.   tiZANidine 4 MG tablet Commonly known as: ZANAFLEX Take 1 tablet by mouth 3 (three) times daily as needed for muscle spasms.        Follow-up Information     Abigail Miyamoto, MD. Schedule an appointment as soon as possible for a visit in 3 week(s).   Specialty: General Surgery Why: call the office and make an appointment to see me in 3 to 4 weeks Contact information: [address] [place] [address] [phone]         Macy Mis, MD Follow up in 1 week(s).   Specialty: Family Medicine Why: for repeat labwork (kidney function) Contact information: [address] [place] [address] [phone]                 Consultations: General surgery   Procedures/Studies:  DG Cholangiogram Operative  Result Date: 04/15/2023 CLINICAL DATA:  [id number] Surgery, elective [id number] EXAM: INTRAOPERATIVE CHOLANGIOGRAM COMPARISON:  CT AP and MRCP, 04/15/2023. FLUOROSCOPY: Exposure Index (as provided by the fluoroscopic device): 15.9 mGy Kerma FINDINGS: Limited oblique planar images of the RIGHT upper quadrant obtained C-arm. Images demonstrating laparoscopic  instrumentation, cystic duct cannulation and antegrade cholangiogram. Free spillage of contrast into the duodenum. No biliary ductal dilation. No evidence of biliary filling defect is demonstrated. IMPRESSION: Fluoroscopic imaging for intraoperative cholangiogram. No biliary ductal dilation or discrete filling defect is demonstrated. For complete description of intra procedural findings, please see performing service dictation. Electronically Signed   By: Roanna Banning M.D.   On: 04/15/2023 16:41   MR ABDOMEN MRCP WO CONTRAST  Result Date: 04/15/2023 CLINICAL DATA:  Right upper quadrant abdominal pain, shortness of breath, pancreatitis suspected EXAM: MRI ABDOMEN WITHOUT CONTRAST  (INCLUDING MRCP) TECHNIQUE: Multiplanar multisequence MR imaging of the abdomen was performed. Heavily T2-weighted images of the biliary and pancreatic ducts were obtained, and three-dimensional MRCP images were rendered by post processing. COMPARISON:  CT chest abdomen pelvis angiogram, 04/15/2023 FINDINGS: Lower chest: No acute abnormality. Hepatobiliary: No solid liver abnormality is seen. Mildly distended gallbladder containing numerous tiny gallstones near the tortuous gallbladder neck. Common bile duct is at the  upper limit of normal in caliber measuring 0.7 cm, without intrahepatic biliary ductal dilatation. Small foci of possible pneumobilia identified by prior CT are not appreciated by MR. Pancreas: Unremarkable. No pancreatic ductal dilatation or surrounding inflammatory changes. Spleen: Normal in size without significant abnormality. Adrenals/Urinary Tract: Adrenal glands are unremarkable. Kidneys are normal, without renal calculi, solid lesion, or hydronephrosis. Stomach/Bowel: Stomach is within normal limits. No evidence of bowel wall thickening, distention, or inflammatory changes. Vascular/Lymphatic: No significant vascular findings are present. No enlarged abdominal lymph nodes. Other: No abdominal wall hernia or  abnormality. No ascites. Musculoskeletal: No acute or significant osseous findings. IMPRESSION: 1. Mildly distended gallbladder containing numerous tiny gallstones near the tortuous gallbladder neck. No gallbladder wall thickening or pericholecystic fluid. 2. Common bile duct is at the upper limit of normal in caliber measuring 0.7 cm, without intrahepatic biliary ductal dilatation nor evidence choledocholithiasis. Small foci of possible pneumobilia identified by prior CT are not appreciated by MR. 3. No evidence of pancreatitis nor other acute inflammatory findings in the abdomen. Electronically Signed   By: Jearld Lesch M.D.   On: 04/15/2023 13:33   MR 3D Recon At Scanner  Result Date: 04/15/2023 CLINICAL DATA:  Right upper quadrant abdominal pain, shortness of breath, pancreatitis suspected EXAM: MRI ABDOMEN WITHOUT CONTRAST  (INCLUDING MRCP) TECHNIQUE: Multiplanar multisequence MR imaging of the abdomen was performed. Heavily T2-weighted images of the biliary and pancreatic ducts were obtained, and three-dimensional MRCP images were rendered by post processing. COMPARISON:  CT chest abdomen pelvis angiogram, 04/15/2023 FINDINGS: Lower chest: No acute abnormality. Hepatobiliary: No solid liver abnormality is seen. Mildly distended gallbladder containing numerous tiny gallstones near the tortuous gallbladder neck. Common bile duct is at the upper limit of normal in caliber measuring 0.7 cm, without intrahepatic biliary ductal dilatation. Small foci of possible pneumobilia identified by prior CT are not appreciated by MR. Pancreas: Unremarkable. No pancreatic ductal dilatation or surrounding inflammatory changes. Spleen: Normal in size without significant abnormality. Adrenals/Urinary Tract: Adrenal glands are unremarkable. Kidneys are normal, without renal calculi, solid lesion, or hydronephrosis. Stomach/Bowel: Stomach is within normal limits. No evidence of bowel wall thickening, distention, or inflammatory  changes. Vascular/Lymphatic: No significant vascular findings are present. No enlarged abdominal lymph nodes. Other: No abdominal wall hernia or abnormality. No ascites. Musculoskeletal: No acute or significant osseous findings. IMPRESSION: 1. Mildly distended gallbladder containing numerous tiny gallstones near the tortuous gallbladder neck. No gallbladder wall thickening or pericholecystic fluid. 2. Common bile duct is at the upper limit of normal in caliber measuring 0.7 cm, without intrahepatic biliary ductal dilatation nor evidence choledocholithiasis. Small foci of possible pneumobilia identified by prior CT are not appreciated by MR. 3. No evidence of pancreatitis nor other acute inflammatory findings in the abdomen. Electronically Signed   By: Jearld Lesch M.D.   On: 04/15/2023 13:33   CT Angio Chest/Abd/Pel for Dissection W and/or W/WO  Result Date: 04/15/2023 CLINICAL DATA:  Thoracoabdominal aortic dissection post open repair. Abdominal pain EXAM: CT ANGIOGRAPHY CHEST, ABDOMEN AND PELVIS TECHNIQUE: Chest CTA 01/11/2020 Multidetector CT imaging through the chest, abdomen and pelvis was performed using the standard protocol during bolus administration of intravenous contrast. Multiplanar reconstructed images and MIPs were obtained and reviewed to evaluate the vascular anatomy. RADIATION DOSE REDUCTION: This exam was performed according to the departmental dose-optimization program which includes automated exposure control, adjustment of the mA and/or kV according to patient size and/or use of iterative reconstruction technique. CONTRAST:  OMNIPAQUE IOHEXOL 350 MG/ML SOLN COMPARISON:  None Available. FINDINGS: CTA CHEST FINDINGS Cardiovascular: Preferential opacification of the thoracic aorta. No evidence of thoracic aortic aneurysm or dissection. Normal heart size. No pericardial effusion. Atheromatous calcification of the aorta and coronaries. Mediastinum/Nodes: Negative for mass or adenopathy  Lungs/Pleura: There is no edema, consolidation, effusion, or pneumothorax. Mild subpleural scarring in the right upper lobe laterally since prior. At the inferior aspect there is a 8 x 3 mm nodular component. 4 mm nodule in the left lower lobe on 6:101. Musculoskeletal: No acute finding Review of the MIP images confirms the above findings. CTA ABDOMEN AND PELVIS FINDINGS VASCULAR Aorta: Diffuse atheromatous wall thickening. No dissection or aneurysm Celiac: Patent without evidence of aneurysm, dissection, vasculitis or significant stenosis. SMA: Patent without evidence of aneurysm, dissection, vasculitis or significant stenosis. Renals: Accessory right renal artery. The renal arteries are smoothly contoured and widely patent IMA: Patent Inflow: Atheromatous plaque without aneurysm, dissection, or significant stenosis. Veins: Unremarkable Review of the MIP images confirms the above findings. NON-VASCULAR Hepatobiliary: Small focus of gas in the peripheral left upper lobe liver with branching shape.Full gallbladder with calculi. Mild CBD dilatation (11 mm) without calcified stone. Pancreas: Unremarkable. Spleen: Unremarkable. Adrenals/Urinary Tract: Negative adrenals. No hydronephrosis or stone. Unremarkable bladder. Stomach/Bowel: Full stomach containing fluid. No visible bowel inflammation or obstructive process. Vascular/Lymphatic: No acute vascular abnormality. No mass or adenopathy. Reproductive:Retracted right testicle into the inguinal canal. Other: No ascites or pneumoperitoneum. Chronic T12 and L4 superior endplate fractures. Musculoskeletal: No acute abnormalities. Generalized lumbar spine degeneration. Review of the MIP images confirms the above findings. IMPRESSION: 1. No evidence of acute aortic syndrome. 2. Full gallbladder with gallstones and mild CBD dilatation. No calcified choledocholithiasis, correlate with liver function tests. 3. Small bubbles of gas over the left lobe liver, indeterminate between  biliary and venous location. No pneumatosis or bowel ischemia noted, consider correlation with lactate and history of sphincterotomy. 4. Fluid distended stomach. 5. Scarring in the right upper lobe with inferior nodular component with borderline nodular appearance inferiorly measuring 8 x 3 mm. Non-contrast chest CT at 6-12 months is recommended. If the nodule is stable at time of repeat CT, then future CT at 18-24 months (from today's scan) is considered optional for low-risk patients, but is recommended for high-risk patients. This recommendation follows the consensus statement: Guidelines for Management of Incidental Pulmonary Nodules Detected on CT Images: From the Fleischner Society 2017; Radiology 2017; 284:228-243. 6. Atherosclerosis including the coronary arteries. Electronically Signed   By: Tiburcio Pea M.D.   On: 04/15/2023 07:31   DG Chest Port 1 View  Result Date: 04/15/2023 CLINICAL DATA:  Chest pain EXAM: PORTABLE CHEST 1 VIEW COMPARISON:  09/12/2020 FINDINGS: The lungs are clear without focal pneumonia, edema, pneumothorax or pleural effusion. Streaky minimal atelectasis noted at the left base. The cardiopericardial silhouette is within normal limits for size. No acute bony abnormality. Telemetry leads overlie the chest. IMPRESSION: No active disease. Electronically Signed   By: Kennith Center M.D.   On: 04/15/2023 06:52     Subjective: - no chest pain, shortness of breath, no abdominal pain, nausea or vomiting.   Discharge Exam: BP 108/71 (BP Location: Left Arm)   Pulse 79   Temp 98.4 F (36.9 C) (Oral)   Resp 18   Ht 5\' 10"  (1.778 m)   Wt 74.8 kg   SpO2 98%   BMI 23.68 kg/m   General: Pt is alert, awake, not in acute distress Cardiovascular: RRR, S1/S2 +, no rubs, no gallops Respiratory: CTA  bilaterally, no wheezing, no rhonchi Abdominal: Soft, NT, ND, bowel sounds + Extremities: no edema, no cyanosis    The results of significant diagnostics from this  hospitalization (including imaging, microbiology, ancillary and laboratory) are listed below for reference.     Microbiology: No results found for this or any previous visit (from the past 240 hour(s)).   Labs: Basic Metabolic Panel: Recent Labs  Lab 04/15/23 0628 04/16/23 0409 04/17/23 0400  NA 135 130* 135  K 3.8 4.6 3.5  CL 100 102 106  CO2 24 18* 19*  GLUCOSE 220* 173* 143*  BUN 16 28* 27*  CREATININE 1.03 1.94* 1.54*  CALCIUM 9.1 7.6* 7.3*  MG  --   --  1.3*   Liver Function Tests: Recent Labs  Lab 04/15/23 0628 04/15/23 1056 04/16/23 0409 04/17/23 0400  AST 121*  --  185* 105*  ALT 50*  --  108* 83*  ALKPHOS 80  --  40 37*  BILITOT 3.0* 4.1* 4.5* 2.0*  PROT 7.7  --  5.9* 5.8*  ALBUMIN 4.0  --  3.0* 2.8*   CBC: Recent Labs  Lab 04/15/23 0628 04/16/23 0409 04/17/23 0400  WBC 11.2* 7.8 7.8  NEUTROABS 10.0*  --   --   HGB 12.5* 9.9* 9.7*  HCT 38.2* 29.6* 28.9*  MCV 106.4* 104.2* 106.6*  PLT 202 114* 114*   CBG: No results for input(s): "GLUCAP" in the last 168 hours. Hgb A1c No results for input(s): "HGBA1C" in the last 72 hours. Lipid Profile No results for input(s): "CHOL", "HDL", "LDLCALC", "TRIG", "CHOLHDL", "LDLDIRECT" in the last 72 hours. Thyroid function studies No results for input(s): "TSH", "T4TOTAL", "T3FREE", "THYROIDAB" in the last 72 hours.  Invalid input(s): "FREET3" Urinalysis    Component Value Date/Time   COLORURINE YELLOW 09/12/2020 1859   APPEARANCEUR CLEAR 09/12/2020 1859   LABSPEC 1.013 09/12/2020 1859   PHURINE 5.0 09/12/2020 1859   GLUCOSEU NEGATIVE 09/12/2020 1859   HGBUR MODERATE (A) 09/12/2020 1859   BILIRUBINUR NEGATIVE 09/12/2020 1859   KETONESUR NEGATIVE 09/12/2020 1859   PROTEINUR 100 (A) 09/12/2020 1859   UROBILINOGEN 0.2 05/30/2012 1350   NITRITE NEGATIVE 09/12/2020 1859   LEUKOCYTESUR NEGATIVE 09/12/2020 1859    FURTHER DISCHARGE INSTRUCTIONS:   Get Medicines reviewed and adjusted: Please take all  your medications with you for your next visit with your Primary MD   Laboratory/radiological data: Please request your Primary MD to go over all hospital tests and procedure/radiological results at the follow up, please ask your Primary MD to get all Hospital records sent to his/her office.   In some cases, they will be blood work, cultures and biopsy results pending at the time of your discharge. Please request that your primary care M.D. goes through all the records of your hospital data and follows up on these results.   Also Note the following: If you experience worsening of your admission symptoms, develop shortness of breath, life threatening emergency, suicidal or homicidal thoughts you must seek medical attention immediately by calling 911 or calling your MD immediately  if symptoms less severe.   You must read complete instructions/literature along with all the possible adverse reactions/side effects for all the Medicines you take and that have been prescribed to you. Take any new Medicines after you have completely understood and accpet all the possible adverse reactions/side effects.    Do not drive when taking Pain medications or sleeping medications (Benzodaizepines)   Do not take more than prescribed Pain, Sleep and Anxiety  Medications. It is not advisable to combine anxiety,sleep and pain medications without talking with your primary care practitioner   Special Instructions: If you have smoked or chewed Tobacco  in the last 2 yrs please stop smoking, stop any regular Alcohol  and or any Recreational drug use.   Wear Seat belts while driving.   Please note: You were cared for by a hospitalist during your hospital stay. Once you are discharged, your primary care physician will handle any further medical issues. Please note that NO REFILLS for any discharge medications will be authorized once you are discharged, as it is imperative that you return to your primary care physician (or  establish a relationship with a primary care physician if you do not have one) for your post hospital discharge needs so that they can reassess your need for medications and monitor your lab values.  Time coordinating discharge: 35 minutes  SIGNED:  Pamella Pert, MD, PhD 04/17/2023, 8:48 AM

## 2023-04-17 NOTE — Progress Notes (Signed)
2 Days Post-Op   Subjective/Chief Complaint: Feeling better this morning Pain improved Tolerating po   Objective: Vital signs in last 24 hours: Temp:  [98 F (36.7 C)-98.4 F (36.9 C)] 98.4 F (36.9 C) (06/23 0502) Pulse Rate:  [75-87] 79 (06/23 0502) Resp:  [16-20] 18 (06/23 0502) BP: (91-113)/(54-76) 108/71 (06/23 0502) SpO2:  [98 %-99 %] 98 % (06/23 0502) Last BM Date : 04/16/23  Intake/Output from previous day: 06/22 0701 - 06/23 0700 In: 5355.8 [P.O.:820; I.V.:4467; IV Piggyback:68.8] Out: -  Intake/Output this shift: No intake/output data recorded.  Exam: Awake and alert Abdomen soft, appropriately tender  Lab Results:  Recent Labs    04/16/23 0409 04/17/23 0400  WBC 7.8 7.8  HGB 9.9* 9.7*  HCT 29.6* 28.9*  PLT 114* 114*   BMET Recent Labs    04/16/23 0409 04/17/23 0400  NA 130* 135  K 4.6 3.5  CL 102 106  CO2 18* 19*  GLUCOSE 173* 143*  BUN 28* 27*  CREATININE 1.94* 1.54*  CALCIUM 7.6* 7.3*   PT/INR No results for input(s): "LABPROT", "INR" in the last 72 hours. ABG No results for input(s): "PHART", "HCO3" in the last 72 hours.  Invalid input(s): "PCO2", "PO2"  Studies/Results: DG Cholangiogram Operative  Result Date: 04/15/2023 CLINICAL DATA:  [id number] Surgery, elective [id number] EXAM: INTRAOPERATIVE CHOLANGIOGRAM COMPARISON:  CT AP and MRCP, 04/15/2023. FLUOROSCOPY: Exposure Index (as provided by the fluoroscopic device): 15.9 mGy Kerma FINDINGS: Limited oblique planar images of the RIGHT upper quadrant obtained C-arm. Images demonstrating laparoscopic instrumentation, cystic duct cannulation and antegrade cholangiogram. Free spillage of contrast into the duodenum. No biliary ductal dilation. No evidence of biliary filling defect is demonstrated. IMPRESSION: Fluoroscopic imaging for intraoperative cholangiogram. No biliary ductal dilation or discrete filling defect is demonstrated. For complete description of intra procedural findings, please see  performing service dictation. Electronically Signed   By: Roanna Banning M.D.   On: 04/15/2023 16:41   MR ABDOMEN MRCP WO CONTRAST  Result Date: 04/15/2023 CLINICAL DATA:  Right upper quadrant abdominal pain, shortness of breath, pancreatitis suspected EXAM: MRI ABDOMEN WITHOUT CONTRAST  (INCLUDING MRCP) TECHNIQUE: Multiplanar multisequence MR imaging of the abdomen was performed. Heavily T2-weighted images of the biliary and pancreatic ducts were obtained, and three-dimensional MRCP images were rendered by post processing. COMPARISON:  CT chest abdomen pelvis angiogram, 04/15/2023 FINDINGS: Lower chest: No acute abnormality. Hepatobiliary: No solid liver abnormality is seen. Mildly distended gallbladder containing numerous tiny gallstones near the tortuous gallbladder neck. Common bile duct is at the upper limit of normal in caliber measuring 0.7 cm, without intrahepatic biliary ductal dilatation. Small foci of possible pneumobilia identified by prior CT are not appreciated by MR. Pancreas: Unremarkable. No pancreatic ductal dilatation or surrounding inflammatory changes. Spleen: Normal in size without significant abnormality. Adrenals/Urinary Tract: Adrenal glands are unremarkable. Kidneys are normal, without renal calculi, solid lesion, or hydronephrosis. Stomach/Bowel: Stomach is within normal limits. No evidence of bowel wall thickening, distention, or inflammatory changes. Vascular/Lymphatic: No significant vascular findings are present. No enlarged abdominal lymph nodes. Other: No abdominal wall hernia or abnormality. No ascites. Musculoskeletal: No acute or significant osseous findings. IMPRESSION: 1. Mildly distended gallbladder containing numerous tiny gallstones near the tortuous gallbladder neck. No gallbladder wall thickening or pericholecystic fluid. 2. Common bile duct is at the upper limit of normal in caliber measuring 0.7 cm, without intrahepatic biliary ductal dilatation nor evidence  choledocholithiasis. Small foci of possible pneumobilia identified by prior CT are not appreciated by MR. 3. No  evidence of pancreatitis nor other acute inflammatory findings in the abdomen. Electronically Signed   By: Jearld Lesch M.D.   On: 04/15/2023 13:33   MR 3D Recon At Scanner  Result Date: 04/15/2023 CLINICAL DATA:  Right upper quadrant abdominal pain, shortness of breath, pancreatitis suspected EXAM: MRI ABDOMEN WITHOUT CONTRAST  (INCLUDING MRCP) TECHNIQUE: Multiplanar multisequence MR imaging of the abdomen was performed. Heavily T2-weighted images of the biliary and pancreatic ducts were obtained, and three-dimensional MRCP images were rendered by post processing. COMPARISON:  CT chest abdomen pelvis angiogram, 04/15/2023 FINDINGS: Lower chest: No acute abnormality. Hepatobiliary: No solid liver abnormality is seen. Mildly distended gallbladder containing numerous tiny gallstones near the tortuous gallbladder neck. Common bile duct is at the upper limit of normal in caliber measuring 0.7 cm, without intrahepatic biliary ductal dilatation. Small foci of possible pneumobilia identified by prior CT are not appreciated by MR. Pancreas: Unremarkable. No pancreatic ductal dilatation or surrounding inflammatory changes. Spleen: Normal in size without significant abnormality. Adrenals/Urinary Tract: Adrenal glands are unremarkable. Kidneys are normal, without renal calculi, solid lesion, or hydronephrosis. Stomach/Bowel: Stomach is within normal limits. No evidence of bowel wall thickening, distention, or inflammatory changes. Vascular/Lymphatic: No significant vascular findings are present. No enlarged abdominal lymph nodes. Other: No abdominal wall hernia or abnormality. No ascites. Musculoskeletal: No acute or significant osseous findings. IMPRESSION: 1. Mildly distended gallbladder containing numerous tiny gallstones near the tortuous gallbladder neck. No gallbladder wall thickening or pericholecystic  fluid. 2. Common bile duct is at the upper limit of normal in caliber measuring 0.7 cm, without intrahepatic biliary ductal dilatation nor evidence choledocholithiasis. Small foci of possible pneumobilia identified by prior CT are not appreciated by MR. 3. No evidence of pancreatitis nor other acute inflammatory findings in the abdomen. Electronically Signed   By: Jearld Lesch M.D.   On: 04/15/2023 13:33    Anti-infectives: Anti-infectives (From admission, onward)    Start     Dose/Rate Route Frequency Ordered Stop   04/15/23 1400  piperacillin-tazobactam (ZOSYN) 3.375 g in sodium chloride 0.9 % 50 mL IVPB  Status:  Discontinued        3.375 g 12.5 mL/hr over 240 Minutes Intravenous Every 8 hours 04/15/23 1151 04/15/23 1152   04/15/23 1400  piperacillin-tazobactam (ZOSYN) IVPB 3.375 g        3.375 g 12.5 mL/hr over 240 Minutes Intravenous Every 8 hours 04/15/23 1152     04/15/23 0745  piperacillin-tazobactam (ZOSYN) IVPB 3.375 g        3.375 g 100 mL/hr over 30 Minutes Intravenous  Once 04/15/23 0739 04/15/23 0830       Assessment/Plan: Cholecystitis with cholelithiasis, suspected cholangitis S/p lap chole and IOC 6/21  LFT's much better today, trending down WBC normal Creatinine improving  From a general surgery standpoint, I am ok with him being discharged.  I will go ahead and send in pain meds and antibiotics as well as arrange follow up  LOS: 0 days    Abigail Miyamoto 04/17/2023

## 2023-04-17 NOTE — Plan of Care (Signed)
Patient awake,alert orient x4. Remained on room. Patient stable for discharge as per MD order. Left unit via wheelchair. No signs or symptoms of acute distress at the time of discharge.  Problem: Education: Goal: Knowledge of General Education information will improve Description: Including pain rating scale, medication(s)/side effects and non-pharmacologic comfort measures Outcome: Adequate for Discharge   Problem: Health Behavior/Discharge Planning: Goal: Ability to manage health-related needs will improve Outcome: Adequate for Discharge   Problem: Clinical Measurements: Goal: Ability to maintain clinical measurements within normal limits will improve Outcome: Adequate for Discharge Goal: Will remain free from infection Outcome: Adequate for Discharge Goal: Diagnostic test results will improve Outcome: Adequate for Discharge Goal: Respiratory complications will improve Outcome: Adequate for Discharge Goal: Cardiovascular complication will be avoided Outcome: Adequate for Discharge   Problem: Activity: Goal: Risk for activity intolerance will decrease Outcome: Adequate for Discharge   Problem: Nutrition: Goal: Adequate nutrition will be maintained Outcome: Adequate for Discharge   Problem: Coping: Goal: Level of anxiety will decrease Outcome: Adequate for Discharge   Problem: Elimination: Goal: Will not experience complications related to bowel motility Outcome: Adequate for Discharge Goal: Will not experience complications related to urinary retention Outcome: Adequate for Discharge   Problem: Pain Managment: Goal: General experience of comfort will improve Outcome: Adequate for Discharge   Problem: Safety: Goal: Ability to remain free from injury will improve Outcome: Adequate for Discharge   Problem: Skin Integrity: Goal: Risk for impaired skin integrity will decrease Outcome: Adequate for Discharge

## 2023-04-18 ENCOUNTER — Other Ambulatory Visit: Payer: Self-pay

## 2023-04-19 LAB — SURGICAL PATHOLOGY

## 2024-11-25 ENCOUNTER — Encounter (HOSPITAL_COMMUNITY): Payer: Self-pay

## 2024-11-25 ENCOUNTER — Emergency Department (HOSPITAL_COMMUNITY)

## 2024-11-25 ENCOUNTER — Other Ambulatory Visit: Payer: Self-pay

## 2024-11-25 ENCOUNTER — Emergency Department (HOSPITAL_COMMUNITY)
Admission: EM | Admit: 2024-11-25 | Discharge: 2024-11-26 | Disposition: A | Attending: Emergency Medicine | Admitting: Emergency Medicine

## 2024-11-25 DIAGNOSIS — R42 Dizziness and giddiness: Secondary | ICD-10-CM | POA: Insufficient documentation

## 2024-11-25 DIAGNOSIS — Y900 Blood alcohol level of less than 20 mg/100 ml: Secondary | ICD-10-CM | POA: Insufficient documentation

## 2024-11-25 LAB — MAGNESIUM: Magnesium: 1.3 mg/dL — ABNORMAL LOW (ref 1.7–2.4)

## 2024-11-25 LAB — CBC WITH DIFFERENTIAL/PLATELET
Abs Immature Granulocytes: 0.05 10*3/uL (ref 0.00–0.07)
Basophils Absolute: 0 10*3/uL (ref 0.0–0.1)
Basophils Relative: 0 %
Eosinophils Absolute: 0 10*3/uL (ref 0.0–0.5)
Eosinophils Relative: 0 %
HCT: 35.7 % — ABNORMAL LOW (ref 39.0–52.0)
Hemoglobin: 12.7 g/dL — ABNORMAL LOW (ref 13.0–17.0)
Immature Granulocytes: 1 %
Lymphocytes Relative: 4 %
Lymphs Abs: 0.4 10*3/uL — ABNORMAL LOW (ref 0.7–4.0)
MCH: 37.1 pg — ABNORMAL HIGH (ref 26.0–34.0)
MCHC: 35.6 g/dL (ref 30.0–36.0)
MCV: 104.4 fL — ABNORMAL HIGH (ref 80.0–100.0)
Monocytes Absolute: 0.8 10*3/uL (ref 0.1–1.0)
Monocytes Relative: 8 %
Neutro Abs: 8.9 10*3/uL — ABNORMAL HIGH (ref 1.7–7.7)
Neutrophils Relative %: 87 %
Platelets: 175 10*3/uL (ref 150–400)
RBC: 3.42 MIL/uL — ABNORMAL LOW (ref 4.22–5.81)
RDW: 12.3 % (ref 11.5–15.5)
WBC: 10.1 10*3/uL (ref 4.0–10.5)
nRBC: 0 % (ref 0.0–0.2)

## 2024-11-25 LAB — ETHANOL: Alcohol, Ethyl (B): <15 mg/dL

## 2024-11-25 LAB — COMPREHENSIVE METABOLIC PANEL WITH GFR
ALT: 39 U/L (ref 0–44)
AST: 52 U/L — ABNORMAL HIGH (ref 15–41)
Albumin: 4.2 g/dL (ref 3.5–5.0)
Alkaline Phosphatase: 100 U/L (ref 38–126)
Anion gap: 21 — ABNORMAL HIGH (ref 5–15)
BUN: 8 mg/dL (ref 6–20)
CO2: 18 mmol/L — ABNORMAL LOW (ref 22–32)
Calcium: 9 mg/dL (ref 8.9–10.3)
Chloride: 95 mmol/L — ABNORMAL LOW (ref 98–111)
Creatinine, Ser: 0.97 mg/dL (ref 0.61–1.24)
GFR, Estimated: >60 mL/min
Glucose, Bld: 214 mg/dL — ABNORMAL HIGH (ref 70–99)
Potassium: 3.4 mmol/L — ABNORMAL LOW (ref 3.5–5.1)
Sodium: 134 mmol/L — ABNORMAL LOW (ref 135–145)
Total Bilirubin: 1 mg/dL (ref 0.0–1.2)
Total Protein: 7.2 g/dL (ref 6.5–8.1)

## 2024-11-25 MED ORDER — SODIUM CHLORIDE 0.9 % IV BOLUS
1000.0000 mL | Freq: Once | INTRAVENOUS | Status: AC
  Administered 2024-11-25: 1000 mL via INTRAVENOUS

## 2024-11-25 MED ORDER — MAGNESIUM SULFATE 2 GM/50ML IV SOLN
2.0000 g | INTRAVENOUS | Status: AC
  Administered 2024-11-25: 2 g via INTRAVENOUS
  Filled 2024-11-25: qty 50

## 2024-11-25 MED ORDER — LORAZEPAM 2 MG/ML IJ SOLN
1.0000 mg | Freq: Once | INTRAMUSCULAR | Status: AC
  Administered 2024-11-25: 1 mg via INTRAVENOUS
  Filled 2024-11-25: qty 1

## 2024-11-25 MED ORDER — ONDANSETRON 4 MG PO TBDP: 4.0000 mg | ORAL_TABLET | Freq: Three times a day (TID) | ORAL | 0 refills | Status: AC | PRN

## 2024-11-25 MED ORDER — MECLIZINE HCL 25 MG PO TABS: 25.0000 mg | ORAL_TABLET | Freq: Three times a day (TID) | ORAL | 0 refills | Status: AC

## 2024-11-25 MED ORDER — MECLIZINE HCL 25 MG PO TABS
25.0000 mg | ORAL_TABLET | Freq: Once | ORAL | Status: AC
  Administered 2024-11-25: 25 mg via ORAL
  Filled 2024-11-25: qty 1

## 2024-11-25 NOTE — Discharge Instructions (Addendum)
 Be sure to monitor your condition carefully, stay well-hydrated and follow-up with your physician.  Return here for concerning changes in your condition.

## 2024-11-25 NOTE — ED Triage Notes (Signed)
 Pt has been dizzy for the last 4-5 hours. Hx of vertigo. Has been nauseous and has vomited a few times. Attempted to take meclizine  but it came back up. Has also fallen 3 times and is complaining of back pain that radiates from mid back to the neck. Ems gave 4 mg of zofran  iv enroute.

## 2024-11-26 MED ORDER — PROCHLORPERAZINE EDISYLATE 10 MG/2ML IJ SOLN
5.0000 mg | Freq: Once | INTRAMUSCULAR | Status: AC
  Administered 2024-11-26: 5 mg via INTRAVENOUS
  Filled 2024-11-26: qty 2
